# Patient Record
Sex: Female | Born: 1990 | Race: White | Hispanic: No | Smoking: Current every day smoker
Health system: Southern US, Community
[De-identification: ages and names within clinical notes are randomized; demographics above are authoritative.]

## PROBLEM LIST (undated history)

## (undated) ENCOUNTER — Inpatient Hospital Stay: Payer: Self-pay

## (undated) DIAGNOSIS — D649 Anemia, unspecified: Secondary | ICD-10-CM

## (undated) DIAGNOSIS — K649 Unspecified hemorrhoids: Secondary | ICD-10-CM

## (undated) DIAGNOSIS — I10 Essential (primary) hypertension: Secondary | ICD-10-CM

## (undated) DIAGNOSIS — K219 Gastro-esophageal reflux disease without esophagitis: Secondary | ICD-10-CM

## (undated) DIAGNOSIS — F32A Depression, unspecified: Secondary | ICD-10-CM

## (undated) DIAGNOSIS — Z5189 Encounter for other specified aftercare: Secondary | ICD-10-CM

## (undated) DIAGNOSIS — Z720 Tobacco use: Secondary | ICD-10-CM

## (undated) HISTORY — DX: Encounter for other specified aftercare: Z51.89

## (undated) HISTORY — DX: Gastro-esophageal reflux disease without esophagitis: K21.9

## (undated) HISTORY — DX: Tobacco use: Z72.0

## (undated) HISTORY — DX: Depression, unspecified: F32.A

## (undated) HISTORY — PX: NO PAST SURGERIES: SHX2092

## (undated) HISTORY — DX: Anemia, unspecified: D64.9

---
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
## 2012-12-28 ENCOUNTER — Emergency Department: Payer: Self-pay | Admitting: Emergency Medicine

## 2012-12-28 LAB — COMPREHENSIVE METABOLIC PANEL
Albumin: 3.9 g/dL (ref 3.4–5.0)
Alkaline Phosphatase: 70 U/L
Anion Gap: 5 — ABNORMAL LOW (ref 7–16)
BUN: 4 mg/dL — ABNORMAL LOW (ref 7–18)
Bilirubin,Total: 0.2 mg/dL (ref 0.2–1.0)
Calcium, Total: 8.8 mg/dL (ref 8.5–10.1)
Chloride: 107 mmol/L (ref 98–107)
EGFR (African American): >60
EGFR (Non-African Amer.): >60
Glucose: 85 mg/dL (ref 65–99)
Osmolality: 272 (ref 275–301)
Potassium: 2.8 mmol/L — ABNORMAL LOW (ref 3.5–5.1)
SGOT(AST): 26 U/L (ref 15–37)
SGPT (ALT): 30 U/L (ref 12–78)
Total Protein: 7.5 g/dL (ref 6.4–8.2)

## 2012-12-28 LAB — CBC WITH DIFFERENTIAL/PLATELET
Basophil #: 0 10*3/uL (ref 0.0–0.1)
Eosinophil %: 2 %
Lymphocyte %: 37.6 %
MCHC: 34.1 g/dL (ref 32.0–36.0)
Monocyte #: 0.7 x10 3/mm (ref 0.2–0.9)
Monocyte %: 8.4 %
Neutrophil %: 51.5 %

## 2012-12-29 LAB — URINALYSIS, COMPLETE
Bilirubin,UR: NEGATIVE
Glucose,UR: NEGATIVE mg/dL (ref 0–75)
Ketone: NEGATIVE
Ph: 5 (ref 4.5–8.0)
RBC,UR: 4 /HPF (ref 0–5)
Squamous Epithelial: 17

## 2012-12-29 LAB — WET PREP, GENITAL

## 2013-01-06 ENCOUNTER — Emergency Department: Payer: Self-pay | Admitting: Internal Medicine

## 2013-01-06 LAB — WET PREP, GENITAL

## 2013-01-06 LAB — CBC
HCT: 43.8 % (ref 35.0–47.0)
MCH: 28.9 pg (ref 26.0–34.0)
MCHC: 33.5 g/dL (ref 32.0–36.0)
MCV: 87 fL (ref 80–100)
Platelet: 239 10*3/uL (ref 150–440)
RDW: 13.6 % (ref 11.5–14.5)
WBC: 7.3 10*3/uL (ref 3.6–11.0)

## 2013-01-06 LAB — URINALYSIS, COMPLETE
Ketone: NEGATIVE
Leukocyte Esterase: NEGATIVE
Nitrite: NEGATIVE
RBC,UR: 1 /HPF (ref 0–5)
Specific Gravity: 1.005 (ref 1.003–1.030)
Squamous Epithelial: 4
WBC UR: 2 /HPF (ref 0–5)

## 2013-01-06 LAB — HCG, QUANTITATIVE, PREGNANCY: Beta Hcg, Quant.: 142 m[IU]/mL — ABNORMAL HIGH

## 2013-09-06 ENCOUNTER — Emergency Department: Payer: Self-pay | Admitting: Emergency Medicine

## 2013-09-06 LAB — CBC
HCT: 41.3 % (ref 35.0–47.0)
HGB: 14 g/dL (ref 12.0–16.0)
MCH: 30.1 pg (ref 26.0–34.0)
MCHC: 34 g/dL (ref 32.0–36.0)
MCV: 89 fL (ref 80–100)
Platelet: 224 10*3/uL (ref 150–440)
RBC: 4.66 10*6/uL (ref 3.80–5.20)
RDW: 13.1 % (ref 11.5–14.5)
WBC: 9.8 10*3/uL (ref 3.6–11.0)

## 2013-09-06 LAB — URINALYSIS, COMPLETE
BLOOD: NEGATIVE
Bilirubin,UR: NEGATIVE
Glucose,UR: NEGATIVE mg/dL (ref 0–75)
Ketone: NEGATIVE
Nitrite: NEGATIVE
PH: 5 (ref 4.5–8.0)
PROTEIN: NEGATIVE
RBC,UR: 4 /HPF (ref 0–5)
Specific Gravity: 1.019 (ref 1.003–1.030)
Squamous Epithelial: 27
WBC UR: 35 /HPF (ref 0–5)

## 2013-09-07 LAB — GC/CHLAMYDIA PROBE AMP

## 2013-09-07 LAB — WET PREP, GENITAL

## 2013-09-07 LAB — HCG, QUANTITATIVE, PREGNANCY: BETA HCG, QUANT.: 26649 m[IU]/mL — AB

## 2013-11-17 ENCOUNTER — Observation Stay: Payer: Self-pay

## 2013-11-17 LAB — URINALYSIS, COMPLETE
BILIRUBIN, UR: NEGATIVE
Bacteria: NONE SEEN
Blood: NEGATIVE
Glucose,UR: NEGATIVE mg/dL (ref 0–75)
Ketone: NEGATIVE
Leukocyte Esterase: NEGATIVE
NITRITE: NEGATIVE
Ph: 6 (ref 4.5–8.0)
Protein: NEGATIVE
RBC,UR: NONE SEEN /HPF (ref 0–5)
Specific Gravity: 1.001 (ref 1.003–1.030)
Squamous Epithelial: 2
WBC UR: NONE SEEN /HPF (ref 0–5)

## 2014-03-11 ENCOUNTER — Inpatient Hospital Stay: Payer: Self-pay | Admitting: Obstetrics and Gynecology

## 2014-03-11 LAB — CBC WITH DIFFERENTIAL/PLATELET
BASOS ABS: 0 10*3/uL (ref 0.0–0.1)
BASOS PCT: 0.3 %
Eosinophil #: 0 10*3/uL (ref 0.0–0.7)
Eosinophil %: 0.2 %
HCT: 38.4 % (ref 35.0–47.0)
HGB: 12.6 g/dL (ref 12.0–16.0)
Lymphocyte #: 1.8 10*3/uL (ref 1.0–3.6)
Lymphocyte %: 18.3 %
MCH: 27 pg (ref 26.0–34.0)
MCHC: 32.8 g/dL (ref 32.0–36.0)
MCV: 82 fL (ref 80–100)
MONOS PCT: 5.1 %
Monocyte #: 0.5 x10 3/mm (ref 0.2–0.9)
Neutrophil #: 7.5 10*3/uL — ABNORMAL HIGH (ref 1.4–6.5)
Neutrophil %: 76.1 %
PLATELETS: 263 10*3/uL (ref 150–440)
RBC: 4.66 10*6/uL (ref 3.80–5.20)
RDW: 15.5 % — ABNORMAL HIGH (ref 11.5–14.5)
WBC: 9.9 10*3/uL (ref 3.6–11.0)

## 2014-03-12 LAB — PLATELET COUNT: PLATELETS: 221 10*3/uL (ref 150–440)

## 2014-03-12 LAB — GC/CHLAMYDIA PROBE AMP

## 2014-03-13 LAB — HEMATOCRIT: HCT: 31.6 % — ABNORMAL LOW (ref 35.0–47.0)

## 2014-05-31 DIAGNOSIS — Z720 Tobacco use: Secondary | ICD-10-CM | POA: Insufficient documentation

## 2014-06-14 NOTE — H&P (Signed)
L&D Evaluation:  History:  HPI 23yo SWF sent over from office with SROM at 41 weeks, [id number]; pregancy complicated by late entry to care and smoking   Presents with leaking fluid   Patient's Medical History No Chronic Illness   Patient's Surgical History none   Medications Pre Natal Vitamins  Tylenol (Acetaminophen)   Allergies NKDA   Social History tobacco   Family History Non-Contributory   ROS:  ROS All systems were reviewed.  HEENT, CNS, GI, GU, Respiratory, CV, Renal and Musculoskeletal systems were found to be normal.   Exam:  Vital Signs stable   General no apparent distress   Mental Status clear   Chest clear   Heart normal sinus rhythm   Abdomen gravid, tender with contractions   Estimated Fetal Weight Average for gestational age   Fetal Position vtx   Edema no edema   Pelvic no external lesions, 1/70/-2   Mebranes Ruptured, 1345   Description clear   FHT normal rate with no decels   Fetal Heart Rate 135   Ucx irregular   Ucx Frequency 5 min   Length of each Contraction 50 seconds   Ucx Pain Scale 5   Skin dry   Impression:  Impression early labor, SROM at 41 weeks   Plan:  Plan EFM/NST, monitor contractions and for cervical change   Comments intermittent monitoring during early labor   Electronic Signatures: Yolanda BonineBurr, Melody N (CNM)  (Signed 05-Feb-16 21:33)  Authored: L&D Evaluation   Last Updated: 05-Feb-16 21:33 by Ulyses AmorBurr, Melody N (CNM)

## 2015-02-05 NOTE — L&D Delivery Note (Signed)
Delivery Note At 0233am a viable and healthy female "Jasmine Gray"  was delivered via  (Presentation:LOA ;  ).  APGAR:8 ,9 ; weight  2200gm/ 4#14oz  .   Placenta status: delivered intact with three vessel  Cord:  with the following complications: Bowdle x1 with cord around leg also, unable to reduce on perineum, infant delivered through; prolonged PROM  Anesthesia:  epidural Episiotomy:  none Lacerations:  none Suture Repair: NA Est. Blood Loss (mL):  200  Mom to postpartum.  Baby to Couplet care / Skin to Skin.  Melody NIKE Shambley, CNM 01/10/2016, 2:43 AM

## 2015-06-10 ENCOUNTER — Encounter: Payer: Self-pay | Admitting: *Deleted

## 2015-06-10 DIAGNOSIS — O26899 Other specified pregnancy related conditions, unspecified trimester: Secondary | ICD-10-CM | POA: Insufficient documentation

## 2015-06-10 DIAGNOSIS — F17210 Nicotine dependence, cigarettes, uncomplicated: Secondary | ICD-10-CM | POA: Insufficient documentation

## 2015-06-10 DIAGNOSIS — R1030 Lower abdominal pain, unspecified: Secondary | ICD-10-CM | POA: Diagnosis present

## 2015-06-10 LAB — URINALYSIS COMPLETE WITH MICROSCOPIC (ARMC ONLY)
Bilirubin Urine: NEGATIVE
Glucose, UA: NEGATIVE mg/dL
Hgb urine dipstick: NEGATIVE
KETONES UR: NEGATIVE mg/dL
Nitrite: NEGATIVE
PH: 6 (ref 5.0–8.0)
Protein, ur: NEGATIVE mg/dL
SPECIFIC GRAVITY, URINE: 1.003 — AB (ref 1.005–1.030)

## 2015-06-10 LAB — POCT PREGNANCY, URINE: Preg Test, Ur: POSITIVE — AB

## 2015-06-10 NOTE — ED Notes (Signed)
Pt c/o R plevic pain radiating to back. Pt states faint positive home preg test x 2 days ago. Pt states nausea, denies vomiting. Pt denies blood in urine and vaginal discharge and bleeding. Pt c/o slight dysuria, relates to prior hx of UTI while pregnant.

## 2015-06-11 ENCOUNTER — Emergency Department
Admission: EM | Admit: 2015-06-11 | Discharge: 2015-06-11 | Disposition: A | Discharge status: Home / Self Care | Payer: Medicaid Other | Attending: Emergency Medicine | Admitting: Emergency Medicine

## 2015-06-11 DIAGNOSIS — Z3490 Encounter for supervision of normal pregnancy, unspecified, unspecified trimester: Secondary | ICD-10-CM

## 2015-06-11 DIAGNOSIS — R109 Unspecified abdominal pain: Secondary | ICD-10-CM

## 2015-06-11 LAB — CBC WITH DIFFERENTIAL/PLATELET
Basophils Absolute: 0 10*3/uL (ref 0–0.1)
Basophils Relative: 0 %
Eosinophils Absolute: 0.1 10*3/uL (ref 0–0.7)
Eosinophils Relative: 1 %
HEMATOCRIT: 42.5 % (ref 35.0–47.0)
HEMOGLOBIN: 14.6 g/dL (ref 12.0–16.0)
LYMPHS ABS: 2.6 10*3/uL (ref 1.0–3.6)
Lymphocytes Relative: 33 %
MCH: 29.4 pg (ref 26.0–34.0)
MCHC: 34.5 g/dL (ref 32.0–36.0)
MCV: 85.4 fL (ref 80.0–100.0)
MONOS PCT: 6 %
Monocytes Absolute: 0.5 10*3/uL (ref 0.2–0.9)
NEUTROS ABS: 4.7 10*3/uL (ref 1.4–6.5)
NEUTROS PCT: 60 %
Platelets: 226 10*3/uL (ref 150–440)
RBC: 4.97 MIL/uL (ref 3.80–5.20)
RDW: 14.3 % (ref 11.5–14.5)
WBC: 7.8 10*3/uL (ref 3.6–11.0)

## 2015-06-11 LAB — HCG, QUANTITATIVE, PREGNANCY: hCG, Beta Chain, Quant, S: 2695 m[IU]/mL — ABNORMAL HIGH (ref <5)

## 2015-06-11 NOTE — ED Provider Notes (Signed)
[facility] Emergency Department Provider Note    ____________________________________________  Time seen: ~0110  I have reviewed the triage vital signs and the nursing notes.   HISTORY  Chief Complaint Pelvic Pain   History limited by: Not Limited   HPI Jasmine Gray is a 25 y.o. female who presents to the emergency department today with concerns for abdominal pain. She states that the pain started roughly 17 hours ago. She noticed it after waking. She describes it as being located in the lower abdomen and bilateral lower abdomen. She describes it as a dull type pain. She states that she is pregnant although unsure how far along. She states that her last menstrual period was in the mid to late March. She states that she has had similar pain with her 2 previous pregnancies. Both times she states that they're secondary to urinary tract infection. She does state that she has had some hesitancy with urination although no pain or foul odor. Patient has not had any abnormal vaginal discharge or bleeding. No fevers.    History reviewed. No pertinent past medical history.  Patient Active Problem List   Diagnosis Date Noted  . Tobacco user 05/31/2014    History reviewed. No pertinent past surgical history.  Current Outpatient Rx  Name  Route  Sig  Dispense  Refill  . medroxyPROGESTERone (DEPO-PROVERA) 150 MG/ML injection   Intramuscular   Inject 150 mg into the muscle every 3 (three) months.           Allergies Penicillins  History reviewed. No pertinent family history.  Social History Social History  Substance Use Topics  . Smoking status: Current Every Day Smoker    Types: Cigarettes  . Smokeless tobacco: Never Used  . Alcohol Use: No    Review of Systems  Constitutional: Negative for fever. Cardiovascular: Negative for chest pain. Respiratory: Negative for shortness of breath. Gastrointestinal:Positive for lower abdominal  pain Neurological: Negative for headaches, focal weakness or numbness.   10-point ROS otherwise negative.  ____________________________________________   PHYSICAL EXAM:  VITAL SIGNS: ED Triage Vitals  Enc Vitals Group     BP 06/10/15 2233 144/76 mmHg     Pulse Rate 06/10/15 2233 118     Resp 06/10/15 2233 18     Temp 06/10/15 2233 98.6 F (37 C)     Temp Source 06/10/15 2233 Oral     SpO2 06/10/15 2233 100 %     Weight 06/10/15 2233 205 lb 1.6 oz (93.033 kg)     Height 06/10/15 2233  (1.727 m)     Head Cir --      Peak Flow --      Pain Score 06/10/15 2235 5   Constitutional: Alert and oriented. Well appearing and in no distress. Eyes: Conjunctivae are normal. PERRL. Normal extraocular movements. ENT   Head: Normocephalic and atraumatic.   Nose: No congestion/rhinnorhea.   Mouth/Throat: Mucous membranes are moist.   Neck: No stridor. Hematological/Lymphatic/Immunilogical: No cervical lymphadenopathy. Cardiovascular: Normal rate, regular rhythm.  No murmurs, rubs, or gallops. Respiratory: Normal respiratory effort without tachypnea nor retractions. Breath sounds are clear and equal bilaterally. No wheezes/rales/rhonchi. Gastrointestinal: Soft and nontender. No distention. There is no CVA tenderness. Genitourinary: Deferred Musculoskeletal: Normal range of motion in all extremities. No joint effusions.  No lower extremity tenderness nor edema. Neurologic:  Normal speech and language. No gross focal neurologic deficits are appreciated.  Skin:  Skin is warm, dry and intact. No rash noted. Psychiatric: Mood  and affect are normal. Speech and behavior are normal. Patient exhibits appropriate insight and judgment.  ____________________________________________    LABS (pertinent positives/negatives)  Labs Reviewed  HCG, QUANTITATIVE, PREGNANCY - Abnormal; Notable for the following:    hCG, Beta Chain, Quant, S 2695 (*)    All other components within normal  limits  URINALYSIS COMPLETEWITH MICROSCOPIC ([facility] ONLY) - Abnormal; Notable for the following:    Color, Urine STRAW (*)    APPearance CLEAR (*)    Specific Gravity, Urine 1.003 (*)    Leukocytes, UA 1+ (*)    Bacteria, UA RARE (*)    Squamous Epithelial / LPF 0-5 (*)    All other components within normal limits  POCT PREGNANCY, URINE - Abnormal; Notable for the following:    Preg Test, Ur POSITIVE (*)    All other components within normal limits  URINE CULTURE  CBC WITH DIFFERENTIAL/PLATELET     ____________________________________________   EKG  None  ____________________________________________    RADIOLOGY  None  ____________________________________________   PROCEDURES  Procedure(s) performed: None  Critical Care performed: No  ____________________________________________   INITIAL IMPRESSION / ASSESSMENT AND PLAN / ED COURSE  Pertinent labs & imaging results that were available during my care of the patient were reviewed by me and considered in my medical decision making (see chart for details).  Patient presented to the emergency department today because of concerns for bilateral lower abdominal pain in the setting of early pregnancy. She states this feels similar to the previous time she has a UTI. UA shows 1+ leukocytes and rare bacteria however no other concerning findings. Will send for a urine culture. Patient's beta-hCG less than 3000. Patient without any audible pain during her stay here in the emergency department. At this point I highly doubt ectopic. The patient would have an unusual presentation given the pain has completely resolved, abdomen was nontender and the pain was bilateral. Patient does state that she has an OB/GYN doctor and can schedule an appointment shortly. Encourage patient to start prenatal vitamins.  ____________________________________________   FINAL CLINICAL IMPRESSION(S) / ED DIAGNOSES  Final diagnoses:  Pregnancy   Abdominal pain, unspecified abdominal location     Phineas SemenGraydon Vahan Wadsworth, MD 06/11/15 [phone]

## 2015-06-11 NOTE — Discharge Instructions (Signed)
As we discussed please start taking prenatal vitamins. Please seek medical attention for any high fevers, chest pain, shortness of breath, change in behavior, persistent vomiting, bloody stool or any other new or concerning symptoms.  First Trimester of Pregnancy The first trimester of pregnancy is from week 1 until the end of week 12 (months 1 through 3). During this time, your baby will begin to develop inside you. At 6-8 weeks, the eyes and face are formed, and the heartbeat can be seen on ultrasound. At the end of 12 weeks, all the baby's organs are formed. Prenatal care is all the medical care you receive before the birth of your baby. Make sure you get good prenatal care and follow all of your doctor's instructions. HOME CARE  Medicines  Take medicine only as told by your doctor. Some medicines are safe and some are not during pregnancy.  Take your prenatal vitamins as told by your doctor.  Take medicine that helps you poop (stool softener) as needed if your doctor says it is okay. Diet  Eat regular, healthy meals.  Your doctor will tell you the amount of weight gain that is right for you.  Avoid raw meat and uncooked cheese.  If you feel sick to your stomach (nauseous) or throw up (vomit):  Eat 4 or 5 small meals a day instead of 3 large meals.  Try eating a few soda crackers.  Drink liquids between meals instead of during meals.  If you have a hard time pooping (constipation):  Eat high-fiber foods like fresh vegetables, fruit, and whole grains.  Drink enough fluids to keep your pee (urine) clear or pale yellow. Activity and Exercise  Exercise only as told by your doctor. Stop exercising if you have cramps or pain in your lower belly (abdomen) or low back.  Try to avoid standing for long periods of time. Move your legs often if you must stand in one place for a long time.  Avoid heavy lifting.  Wear low-heeled shoes. Sit and stand up straight.  You can have sex  unless your doctor tells you not to. Relief of Pain or Discomfort  Wear a good support bra if your breasts are sore.  Take warm water baths (sitz baths) to soothe pain or discomfort caused by hemorrhoids. Use hemorrhoid cream if your doctor says it is okay.  Rest with your legs raised if you have leg cramps or low back pain.  Wear support hose if you have puffy, bulging veins (varicose veins) in your legs. Raise (elevate) your feet for 15 minutes, 3-4 times a day. Limit salt in your diet. Prenatal Care  Schedule your prenatal visits by the twelfth week of pregnancy.  Write down your questions. Take them to your prenatal visits.  Keep all your prenatal visits as told by your doctor. Safety  Wear your seat belt at all times when driving.  Make a list of emergency phone numbers. The list should include numbers for family, friends, the hospital, and police and fire departments. General Tips  Ask your doctor for a referral to a local prenatal class. Begin classes no later than at the start of month 6 of your pregnancy.  Ask for help if you need counseling or help with nutrition. Your doctor can give you advice or tell you where to go for help.  Do not use hot tubs, steam rooms, or saunas.  Do not douche or use tampons or scented sanitary pads.  Do not cross your legs for  long periods of time.  Avoid litter boxes and soil used by cats.  Avoid all smoking, herbs, and alcohol. Avoid drugs not approved by your doctor.  Do not use any tobacco products, including cigarettes, chewing tobacco, and electronic cigarettes. If you need help quitting, ask your doctor. You may get counseling or other support to help you quit.  Visit your dentist. At home, brush your teeth with a soft toothbrush. Be gentle when you floss. GET HELP IF:  You are dizzy.  You have mild cramps or pressure in your lower belly.  You have a nagging pain in your belly area.  You continue to feel sick to your  stomach, throw up, or have watery poop (diarrhea).  You have a bad smelling fluid coming from your vagina.  You have pain with peeing (urination).  You have increased puffiness (swelling) in your face, hands, legs, or ankles. GET HELP RIGHT AWAY IF:   You have a fever.  You are leaking fluid from your vagina.  You have spotting or bleeding from your vagina.  You have very bad belly cramping or pain.  You gain or lose weight rapidly.  You throw up blood. It may look like coffee grounds.  You are around people who have [place] measles, fifth disease, or chickenpox.  You have a very bad headache.  You have shortness of breath.  You have any kind of trauma, such as from a fall or a car accident.   This information is not intended to replace advice given to you by your health care provider. Make sure you discuss any questions you have with your health care provider.   Document Released: 07/10/2007 Document Revised: 02/11/2014 Document Reviewed: 12/01/2012 Else[employer] Interactive Patient Education [employer]2016 [employer]

## 2015-06-12 LAB — URINE CULTURE

## 2015-06-26 ENCOUNTER — Ambulatory Visit (INDEPENDENT_AMBULATORY_CARE_PROVIDER_SITE_OTHER): Payer: Medicaid Other | Admitting: Obstetrics and Gynecology

## 2015-06-26 VITALS — BP 121/73 | HR 99 | Wt 202.6 lb

## 2015-06-26 DIAGNOSIS — Z720 Tobacco use: Secondary | ICD-10-CM

## 2015-06-26 DIAGNOSIS — Z3687 Encounter for antenatal screening for uncertain dates: Secondary | ICD-10-CM

## 2015-06-26 DIAGNOSIS — Z3491 Encounter for supervision of normal pregnancy, unspecified, first trimester: Secondary | ICD-10-CM

## 2015-06-26 DIAGNOSIS — Z36 Encounter for antenatal screening of mother: Secondary | ICD-10-CM

## 2015-06-26 MED ORDER — CITRANATAL B-CALM 20-1 MG & 2 X 25 MG PO MISC: 20.0000 mg | Freq: Every day | ORAL | Status: DC

## 2015-06-26 NOTE — Patient Instructions (Signed)
Morning Sickness Morning sickness is when you feel sick to your stomach (nauseous) during pregnancy. You may feel sick to your stomach and throw up (vomit). You may feel sick in the morning, but you can feel this way any time of day. Some women feel very sick to their stomach and cannot stop throwing up (hyperemesis gravidarum). HOME CARE  Only take medicines as told by your doctor.  Take multivitamins as told by your doctor. Taking multivitamins before getting pregnant can stop or lessen the harshness of morning sickness.  Eat dry toast or unsalted crackers before getting out of bed.  Eat 5 to 6 small meals a day.  Eat dry and bland foods like rice and baked potatoes.  Do not drink liquids with meals. Drink between meals.  Do not eat greasy, fatty, or spicy foods.  Have someone cook for you if the smell of food causes you to feel sick or throw up.  If you feel sick to your stomach after taking prenatal vitamins, take them at night or with a snack.  Eat protein when you need a snack (nuts, yogurt, cheese).  Eat unsweetened gelatins for dessert.  Wear a bracelet used for sea sickness (acupressure wristband).  Go to a doctor that puts thin needles into certain body points (acupuncture) to improve how you feel.  Do not smoke.  Use a humidifier to keep the air in your house free of odors.  Get lots of fresh air. GET HELP IF:  You need medicine to feel better.  You feel dizzy or lightheaded.  You are losing weight. GET HELP RIGHT AWAY IF:   You feel very sick to your stomach and cannot stop throwing up.  You pass out (faint). MAKE SURE YOU:  Understand these instructions.  Will watch your condition.  Will get help right away if you are not doing well or get worse.   This information is not intended to replace advice given to you by your health care provider. Make sure you discuss any questions you have with your health care provider.   Document Released: 02/29/2004  Document Revised: 02/11/2014 Document Reviewed: 07/08/2012 Elsevier Interactive Patient Education 2016 [employer]. Pregnancy and Zika Virus Disease Zika virus disease, or Zika, is an illness that can spread to people from mosquitoes that carry the virus. It may also spread from person to person through infected body fluids. Zika first occurred in [place], but recently it has spread to new areas. The virus occurs in tropical climates. The location of Zika continues to change. Most people who become infected with Zika virus do not develop serious illness. However, Zika may cause birth defects in an unborn baby whose mother is infected with the virus. It may also increase the risk of miscarriage. WHAT ARE THE SYMPTOMS OF ZIKA VIRUS DISEASE? In many cases, people who have been infected with Zika virus do not develop any symptoms. If symptoms appear, they usually start about a week after the person is infected. Symptoms are usually mild. They may include:  Fever.  Rash.  Red eyes.  Joint pain. HOW DOES ZIKA VIRUS DISEASE SPREAD? The main way that Zika virus spreads is through the bite of a certain type of mosquito. Unlike most types of mosquitos, which bite only at night, the type of mosquito that carries Zika virus bites both at night and during the day. Zika virus can also spread through sexual contact, through a blood transfusion, and from a mother to her baby before or during birth.  Once you have had Zika virus disease, it is unlikely that you will get it again. CAN I PASS ZIKA TO MY BABY DURING PREGNANCY? Yes, Zika can pass from a mother to her baby before or during birth. WHAT PROBLEMS CAN ZIKA CAUSE FOR MY BABY? A woman who is infected with Zika virus while pregnant is at risk of having her baby born with a condition in which the brain or head is smaller than expected (microcephaly). Babies who have microcephaly can have developmental delays, seizures, hearing problems, and vision  problems. Having Zika virus disease during pregnancy can also increase the risk of miscarriage. HOW CAN ZIKA VIRUS DISEASE BE PREVENTED? There is no vaccine to prevent Zika. The best way to prevent the disease is to avoid infected mosquitoes and avoid exposure to body fluids that can spread the virus. Avoid any possible exposure to Zika by taking the following precautions. For women and their sex partners:  Avoid traveling to high-risk areas. The locations where [place] is being reported change often. To identify high-risk areas, check the CDC travel website: http://davidson-gomez.com/  If you or your sex partner must travel to a high-risk area, talk with a health care provider before and after traveling.  Take all precautions to avoid mosquito bites if you live in, or travel to, any of the high-risk areas. Insect repellents are safe to use during pregnancy.  Ask your health care provider when it is safe to have sexual contact. For women:  If you are pregnant or trying to become pregnant, avoid sexual contact with persons who may have been exposed to [place] virus, persons who have possible symptoms of Zika, or persons whose history you are unsure about. If you choose to have sexual contact with someone who may have been exposed to [place] virus, use condoms correctly during the entire duration of sexual activity, every time. Do not share sexual devices, as you may be exposed to body fluids.  Ask your health care provider about when it is safe to attempt pregnancy after a possible exposure to Zika virus. WHAT STEPS SHOULD I TAKE TO AVOID MOSQUITO BITES? Take these steps to avoid mosquito bites when you are in a high-risk area:  Wear loose clothing that covers your arms and legs.  Limit your outdoor activities.  Do not open windows unless they have window screens.  Sleep under mosquito nets.  Use insect repellent. The best insect repellents have:  DEET, picaridin, oil of lemon eucalyptus  (OLE), or IR3535 in them.  Higher amounts of an active ingredient in them.  Remember that insect repellents are safe to use during pregnancy.  Do not use OLE on children who are younger than 57 years of age. Do not use insect repellent on babies who are younger than 91 months of age.  Cover your child's stroller with mosquito netting. Make sure the netting fits snugly and that any loose netting does not cover your child's mouth or nose. Do not use a blanket as a mosquito-protection cover.  Do not apply insect repellent underneath clothing.  If you are using sunscreen, apply the sunscreen before applying the insect repellent.  Treat clothing with permethrin. Do not apply permethrin directly to your skin. Follow label directions for safe use.  Get rid of standing water, where mosquitoes may reproduce. Standing water is often found in items such as buckets, bowls, animal food dishes, and flowerpots. When you return from traveling to any high-risk area, continue taking actions to protect yourself against mosquito bites  for 3 weeks, even if you show no signs of illness. This will prevent spreading Zika virus to uninfected mosquitoes. WHAT SHOULD I KNOW ABOUT THE SEXUAL TRANSMISSION OF ZIKA? People can spread Zika to their sexual partners during vaginal, anal, or oral sex, or by sharing sexual devices. Many people with [place] do not develop symptoms, so a person could spread the disease without knowing that they are infected. The greatest risk is to women who are pregnant or who may become pregnant. Zika virus can live longer in semen than it can live in blood. Couples can prevent sexual transmission of the virus by:  Using condoms correctly during the entire duration of sexual activity, every time. This includes vaginal, anal, and oral sex.  Not sharing sexual devices. Sharing increases your risk of being exposed to body fluid from another person.  Avoiding all sexual activity until your health care  provider says it is safe. SHOULD I BE TESTED FOR ZIKA VIRUS? A sample of your blood can be tested for Zika virus. A pregnant woman should be tested if she may have been exposed to the virus or if she has symptoms of Zika. She may also have additional tests done during her pregnancy, such ultrasound testing. Talk with your health care provider about which tests are recommended.   This information is not intended to replace advice given to you by your health care provider. Make sure you discuss any questions you have with your health care provider.   Document Released: 10/12/2014 Document Reviewed: 10/05/2014 Elsevier Interactive Patient Education [employer] How a Baby Grows During Pregnancy Pregnancy begins when a female's sperm enters a female's egg (fertilization). This happens in one of the tubes (fallopian tubes) that connect the ovaries to the womb (uterus). The fertilized egg is called an embryo until it reaches 10 weeks. From 10 weeks until birth, it is called a fetus. The fertilized egg moves down the fallopian tube to the uterus. Then it implants into the lining of the uterus and begins to grow. The developing fetus receives oxygen and nutrients through the pregnant woman's bloodstream and the tissues that grow (placenta) to support the fetus. The placenta is the life support system for the fetus. It provides nutrition and removes waste. Learning as much as you can about your pregnancy and how your baby is developing can help you enjoy the experience. It can also make you aware of when there might be a problem and when to ask questions. HOW LONG DOES A TYPICAL PREGNANCY LAST? A pregnancy usually lasts 280 days, or about 40 weeks. Pregnancy is divided into three trimesters:  First trimester: 0-13 weeks.  Second trimester: 14-27 weeks.  Third trimester: 28-40 weeks. The day when your baby is considered ready to be born (full term) is your estimated date of delivery. HOW DOES MY BABY  DEVELOP MONTH BY MONTH? First month  The fertilized egg attaches to the inside of the uterus.  Some cells will form the placenta. Others will form the fetus.  The arms, legs, brain, spinal cord, lungs, and heart begin to develop.  At the end of the first month, the heart begins to beat. Second month  The bones, inner ear, eyelids, hands, and feet form.  The genitals develop.  By the end of 8 weeks, all major organs are developing. Third month  All of the internal organs are forming.  Teeth develop below the gums.  Bones and muscles begin to grow. The spine can flex.  The  skin is transparent.  Fingernails and toenails begin to form.  Arms and legs continue to grow longer, and hands and feet develop.  The fetus is about 3 in (7.6 cm) long. Fourth month  The placenta is completely formed.  The external sex organs, neck, outer ear, eyebrows, eyelids, and fingernails are formed.  The fetus can hear, swallow, and move its arms and legs.  The kidneys begin to produce urine.  The skin is covered with a white waxy coating (vernix) and very fine hair (lanugo). Fifth month  The fetus moves around more and can be felt for the first time (quickening).  The fetus starts to sleep and wake up and may begin to suck its finger.  The nails grow to the end of the fingers.  The organ in the digestive system that makes bile (gallbladder) functions and helps to digest the nutrients.  If your baby is a girl, eggs are present in her ovaries. If your baby is a boy, testicles start to move down into his scrotum. Sixth month  The lungs are formed, but the fetus is not yet able to breathe.  The eyes open. The brain continues to develop.  Your baby has fingerprints and toe prints. Your baby's hair grows thicker.  At the end of the second trimester, the fetus is about 9 in (22.9 cm) long. Seventh month  The fetus kicks and stretches.  The eyes are developed enough to sense changes  in light.  The hands can make a grasping motion.  The fetus responds to sound. Eighth month  All organs and body systems are fully developed and functioning.  Bones harden and taste buds develop. The fetus may hiccup.  Certain areas of the brain are still developing. The skull remains soft. Ninth month  The fetus gains about  lb (0.23 kg) each week.  The lungs are fully developed.  Patterns of sleep develop.  The fetus's head typically moves into a head-down position (vertex) in the uterus to prepare for birth. If the buttocks move into a vertex position instead, the baby is breech.  The fetus weighs 6-9 lbs (2.72-4.08 kg) and is 19-20 in (48.26-50.8 cm) long. WHAT CAN I DO TO HAVE A HEALTHY PREGNANCY AND HELP MY BABY DEVELOP? Eating and Drinking  Eat a healthy diet.  Talk with your health care provider to make sure that you are getting the nutrients that you and your baby need.  Visit www.DisposableNylon.bechoosemyplate.gov to learn about creating a healthy diet.  Gain a healthy amount of weight during pregnancy as advised by your health care provider. This is usually 25-35 pounds. You may need to:  Gain more if you were underweight before getting pregnant or if you are pregnant with more than one baby.  Gain less if you were overweight or obese when you got pregnant. Medicines and Vitamins  Take prenatal vitamins as directed by your health care provider. These include vitamins such as folic acid, iron, calcium, and vitamin D. They are important for healthy development.  Take medicines only as directed by your health care provider. Read labels and ask a pharmacist or your health care provider whether over-the-counter medicines, supplements, and prescription drugs are safe to take during pregnancy. Activities  Be physically active as advised by your health care provider. Ask your health care provider to recommend activities that are safe for you to do, such as walking or swimming.  Do  not participate in strenuous or extreme sports. Lifestyle  Do not drink  alcohol.  Do not use any tobacco products, including cigarettes, chewing tobacco, or electronic cigarettes. If you need help quitting, ask your health care provider.  Do not use illegal drugs. Safety  Avoid exposure to mercury, lead, or other heavy metals. Ask your health care provider about common sources of these heavy metals.  Avoid listeria infection during pregnancy. Follow these precautions:  Do not eat soft cheeses or deli meats.  Do not eat hot dogs unless they have been warmed up to the point of steaming, such as in the microwave oven.  Do not drink unpasteurized milk.  Avoid toxoplasmosis infection during pregnancy. Follow these precautions:  Do not change your cat's litter box, if you have a cat. Ask someone else to do this for you.  Wear gardening gloves while working in the yard. General Instructions  Keep all follow-up visits as directed by your health care provider. This is important. This includes prenatal care and screening tests.  Manage any chronic health conditions. Work closely with your health care provider to keep conditions, such as diabetes, under control. HOW DO I KNOW IF MY BABY IS DEVELOPING WELL? At each prenatal visit, your health care provider will do several different tests to check on your health and keep track of your baby's development. These include:  Fundal height.  Your health care provider will measure your growing belly from top to bottom using a tape measure.  Your health care provider will also feel your belly to determine your baby's position.  Heartbeat.  An ultrasound in the first trimester can confirm pregnancy and show a heartbeat, depending on how far along you are.  Your health care provider will check your baby's heart rate at every prenatal visit.  As you get closer to your delivery date, you may have regular fetal heart rate monitoring to make sure  that your baby is not in distress.  Second trimester ultrasound.  This ultrasound checks your baby's development. It also indicates your baby's gender. WHAT SHOULD I DO IF I HAVE CONCERNS ABOUT MY BABY'S DEVELOPMENT? Always talk with your health care provider about any concerns that you may have.   This information is not intended to replace advice given to you by your health care provider. Make sure you discuss any questions you have with your health care provider.   Document Released: 07/10/2007 Document Revised: 10/12/2014 Document Reviewed: 06/30/2013 Elsevier Interactive Patient Education 2016 [employer]. First Trimester of Pregnancy The first trimester of pregnancy is from week 1 until the end of week 12 (months 1 through 3). A week after a sperm fertilizes an egg, the egg will implant on the wall of the uterus. This embryo will begin to develop into a baby. Genes from you and your partner are forming the baby. The female genes determine whether the baby is a boy or a girl. At 6-8 weeks, the eyes and face are formed, and the heartbeat can be seen on ultrasound. At the end of 12 weeks, all the baby's organs are formed.  Now that you are pregnant, you will want to do everything you can to have a healthy baby. Two of the most important things are to get good prenatal care and to follow your health care provider's instructions. Prenatal care is all the medical care you receive before the baby's birth. This care will help prevent, find, and treat any problems during the pregnancy and childbirth. BODY CHANGES Your body goes through many changes during pregnancy. The changes vary from woman  to woman.   You may gain or lose a couple of pounds at first.  You may feel sick to your stomach (nauseous) and throw up (vomit). If the vomiting is uncontrollable, call your health care provider.  You may tire easily.  You may develop headaches that can be relieved by medicines approved by your health  care provider.  You may urinate more often. Painful urination may mean you have a bladder infection.  You may develop heartburn as a result of your pregnancy.  You may develop constipation because certain hormones are causing the muscles that push waste through your intestines to slow down.  You may develop hemorrhoids or swollen, bulging veins (varicose veins).  Your breasts may begin to grow larger and become tender. Your nipples may stick out more, and the tissue that surrounds them (areola) may become darker.  Your gums may bleed and may be sensitive to brushing and flossing.  Dark spots or blotches (chloasma, mask of pregnancy) may develop on your face. This will likely fade after the baby is born.  Your menstrual periods will stop.  You may have a loss of appetite.  You may develop cravings for certain kinds of food.  You may have changes in your emotions from day to day, such as being excited to be pregnant or being concerned that something may go wrong with the pregnancy and baby.  You may have more vivid and strange dreams.  You may have changes in your hair. These can include thickening of your hair, rapid growth, and changes in texture. Some women also have hair loss during or after pregnancy, or hair that feels dry or thin. Your hair will most likely return to normal after your baby is born. WHAT TO EXPECT AT YOUR PRENATAL VISITS During a routine prenatal visit:  You will be weighed to make sure you and the baby are growing normally.  Your blood pressure will be taken.  Your abdomen will be measured to track your baby's growth.  The fetal heartbeat will be listened to starting around week 10 or 12 of your pregnancy.  Test results from any previous visits will be discussed. Your health care provider may ask you:  How you are feeling.  If you are feeling the baby move.  If you have had any abnormal symptoms, such as leaking fluid, bleeding, severe headaches, or  abdominal cramping.  If you are using any tobacco products, including cigarettes, chewing tobacco, and electronic cigarettes.  If you have any questions. Other tests that may be performed during your first trimester include:  Blood tests to find your blood type and to check for the presence of any previous infections. They will also be used to check for low iron levels (anemia) and Rh antibodies. Later in the pregnancy, blood tests for diabetes will be done along with other tests if problems develop.  Urine tests to check for infections, diabetes, or protein in the urine.  An ultrasound to confirm the proper growth and development of the baby.  An amniocentesis to check for possible genetic problems.  Fetal screens for spina bifida and Down syndrome.  You may need other tests to make sure you and the baby are doing well.  HIV (human immunodeficiency virus) testing. Routine prenatal testing includes screening for HIV, unless you choose not to have this test. HOME CARE INSTRUCTIONS  Medicines  Follow your health care provider's instructions regarding medicine use. Specific medicines may be either safe or unsafe to take during pregnancy.  Take your prenatal vitamins as directed.  If you develop constipation, try taking a stool softener if your health care provider approves. Diet  Eat regular, well-balanced meals. Choose a variety of foods, such as meat or vegetable-based protein, fish, milk and low-fat dairy products, vegetables, fruits, and whole grain breads and cereals. Your health care provider will help you determine the amount of weight gain that is right for you.  Avoid raw meat and uncooked cheese. These carry germs that can cause birth defects in the baby.  Eating four or five small meals rather than three large meals a day may help relieve nausea and vomiting. If you start to feel nauseous, eating a few soda crackers can be helpful. Drinking liquids between meals instead of  during meals also seems to help nausea and vomiting.  If you develop constipation, eat more high-fiber foods, such as fresh vegetables or fruit and whole grains. Drink enough fluids to keep your urine clear or pale yellow. Activity and Exercise  Exercise only as directed by your health care provider. Exercising will help you:  Control your weight.  Stay in shape.  Be prepared for labor and delivery.  Experiencing pain or cramping in the lower abdomen or low back is a good sign that you should stop exercising. Check with your health care provider before continuing normal exercises.  Try to avoid standing for long periods of time. Move your legs often if you must stand in one place for a long time.  Avoid heavy lifting.  Wear low-heeled shoes, and practice good posture.  You may continue to have sex unless your health care provider directs you otherwise. Relief of Pain or Discomfort  Wear a good support bra for breast tenderness.   Take warm sitz baths to soothe any pain or discomfort caused by hemorrhoids. Use hemorrhoid cream if your health care provider approves.   Rest with your legs elevated if you have leg cramps or low back pain.  If you develop varicose veins in your legs, wear support hose. Elevate your feet for 15 minutes, 3-4 times a day. Limit salt in your diet. Prenatal Care  Schedule your prenatal visits by the twelfth week of pregnancy. They are usually scheduled monthly at first, then more often in the last 2 months before delivery.  Write down your questions. Take them to your prenatal visits.  Keep all your prenatal visits as directed by your health care provider. Safety  Wear your seat belt at all times when driving.  Make a list of emergency phone numbers, including numbers for family, friends, the hospital, and police and fire departments. General Tips  Ask your health care provider for a referral to a local prenatal education class. Begin classes no  later than at the beginning of month 6 of your pregnancy.  Ask for help if you have counseling or nutritional needs during pregnancy. Your health care provider can offer advice or refer you to specialists for help with various needs.  Do not use hot tubs, steam rooms, or saunas.  Do not douche or use tampons or scented sanitary pads.  Do not cross your legs for long periods of time.  Avoid cat litter boxes and soil used by cats. These carry germs that can cause birth defects in the baby and possibly loss of the fetus by miscarriage or stillbirth.  Avoid all smoking, herbs, alcohol, and medicines not prescribed by your health care provider. Chemicals in these affect the formation and growth of the baby.  Do not use any tobacco products, including cigarettes, chewing tobacco, and electronic cigarettes. If you need help quitting, ask your health care provider. You may receive counseling support and other resources to help you quit.  Schedule a dentist appointment. At home, brush your teeth with a soft toothbrush and be gentle when you floss. SEEK MEDICAL CARE IF:   You have dizziness.  You have mild pelvic cramps, pelvic pressure, or nagging pain in the abdominal area.  You have persistent nausea, vomiting, or diarrhea.  You have a bad smelling vaginal discharge.  You have pain with urination.  You notice increased swelling in your face, hands, legs, or ankles. SEEK IMMEDIATE MEDICAL CARE IF:   You have a fever.  You are leaking fluid from your vagina.  You have spotting or bleeding from your vagina.  You have severe abdominal cramping or pain.  You have rapid weight gain or loss.  You vomit blood or material that looks like coffee grounds.  You are exposed to [place] measles and have never had them.  You are exposed to fifth disease or chickenpox.  You develop a severe headache.  You have shortness of breath.  You have any kind of trauma, such as from a fall or a car  accident.   This information is not intended to replace advice given to you by your health care provider. Make sure you discuss any questions you have with your health care provider.   Document Released: 01/15/2001 Document Revised: 02/11/2014 Document Reviewed: 12/01/2012 Elsevier Interactive Patient Education 2016 [employer]. Commonly Asked Questions During Pregnancy  Cats: A parasite can be excreted in cat feces.  To avoid exposure you need to have another person empty the little box.  If you must empty the litter box you will need to wear gloves.  Wash your hands after handling your cat.  This parasite can also be found in raw or undercooked meat so this should also be avoided.  Colds, Sore Throats, Flu: Please check your medication sheet to see what you can take for symptoms.  If your symptoms are unrelieved by these medications please call the office.  Dental Work: Most any dental work Agricultural consultant recommends is permitted.  X-rays should only be taken during the first trimester if absolutely necessary.  Your abdomen should be shielded with a lead apron during all x-rays.  Please notify your provider prior to receiving any x-rays.  Novocaine is fine; gas is not recommended.  If your dentist requires a note from [place] prior to dental work please call the office and we will provide one for you.  Exercise: Exercise is an important part of staying healthy during your pregnancy.  You may continue most exercises you were accustomed to prior to pregnancy.  Later in your pregnancy you will most likely notice you have difficulty with activities requiring balance like riding a bicycle.  It is important that you listen to your body and avoid activities that put you at a higher risk of falling.  Adequate rest and staying well hydrated are a must!  If you have questions about the safety of specific activities ask your provider.    Exposure to Children with illness: Try to avoid obvious exposure; report any  symptoms to [place] when noted,  If you have chicken pos, red measles or mumps, you should be immune to these diseases.   Please do not take any vaccines while pregnant unless you have checked with your OB provider.  Fetal Movement: After  28 weeks we recommend you do "kick counts" twice daily.  Lie or sit down in a calm quiet environment and count your baby movements "kicks".  You should feel your baby at least 10 times per hour.  If you have not felt 10 kicks within the first hour get up, walk around and have something sweet to eat or drink then repeat for an additional hour.  If count remains less than 10 per hour notify your provider.  Fumigating: Follow your pest control agent's advice as to how long to stay out of your home.  Ventilate the area well before re-entering.  Hemorrhoids:   Most over-the-counter preparations can be used during pregnancy.  Check your medication to see what is safe to use.  It is important to use a stool softener or fiber in your diet and to drink lots of liquids.  If hemorrhoids seem to be getting worse please call the office.   Hot Tubs:  Hot tubs Jacuzzis and saunas are not recommended while pregnant.  These increase your internal body temperature and should be avoided.  Intercourse:  Sexual intercourse is safe during pregnancy as long as you are comfortable, unless otherwise advised by your provider.  Spotting may occur after intercourse; report any bright red bleeding that is heavier than spotting.  Labor:  If you know that you are in labor, please go to the hospital.  If you are unsure, please call the office and let us help you decide what to do.  Lifting, straining, etc:  If your job requires heavy lifting or straining please check with your provider for any limitations.  Generally, you should not lift items heavier than that you can lift simply with your hands and arms (no back muscles)  Painting:  Paint fumes do not harm your pregnancy, but may make you ill and  should be avoided if possible.  Latex or water based paints have less odor than oils.  Use adequate ventilation while painting.  Permanents & Hair Color:  Chemicals in hair dyes are not recommended as they cause increase hair dryness which can increase hair loss during pregnancy.  " Highlighting" and permanents are allowed.  Dye may be absorbed differently and permanents may not hold as well during pregnancy.  Sunbathing:  Use a sunscreen, as skin burns easily during pregnancy.  Drink plenty of fluids; avoid over heating.  Tanning Beds:  Because their possible side effects are still unknown, tanning beds are not recommended.  Ultrasound Scans:  Routine ultrasounds are performed at approximately 20 weeks.  You will be able to see your baby's general anatomy an if you would like to know the gender this can usually be determined as well.  If it is questionable when you conceived you may also receive an ultrasound early in your pregnancy for dating purposes.  Otherwise ultrasound exams are not routinely performed unless there is a medical necessity.  Although you can request a scan we ask that you pay for it when conducted because insurance does not cover " patient request" scans.  Work: If your pregnancy proceeds without complications you may work until your due date, unless your physician or employer advises otherwise.  Round Ligament Pain/Pelvic Discomfort:  Sharp, shooting pains not associated with bleeding are fairly common, usually occurring in the second trimester of pregnancy.  They tend to be worse when standing up or when you remain standing for long periods of time.  These are the result of pressure of certain pelvic ligaments called "  round ligaments".  Rest, Tylenol and heat seem to be the most effective relief.  As the womb and fetus grow, they rise out of the pelvis and the discomfort improves.  Please notify the office if your pain seems different than that described.  It may represent a more  serious condition.

## 2015-06-26 NOTE — Progress Notes (Signed)
Angie FavaGina M Priest presents for NOB nurse interview visit. G3 -.  P1011. Pt seen at [facility] on 06/11/15 for abdominal pain. Pos Beta- 2695. Abnormal urine culture. NO meds given. Pt c/o today of lower pelvic pain.  NO u/s performed.   Pregnancy education material explained and given. _0__ cats in the home. NOB labs ordered.  HIV and Drug screen were explained and ordered. PNV encouraged. Pt complained of mild nausea. Citra natal B calm erx.  NT to discuss with provider. Unsure of lmp. Will order dating scan asap.  Pt. To follow up with provider in _+/-3_ weeks for NOB physical with [facility].  All questions answered

## 2015-06-27 ENCOUNTER — Ambulatory Visit (INDEPENDENT_AMBULATORY_CARE_PROVIDER_SITE_OTHER): Payer: Medicaid Other

## 2015-06-27 DIAGNOSIS — Z3687 Encounter for antenatal screening for uncertain dates: Secondary | ICD-10-CM

## 2015-06-27 DIAGNOSIS — Z36 Encounter for antenatal screening of mother: Secondary | ICD-10-CM | POA: Diagnosis not present

## 2015-06-27 LAB — MONITOR DRUG PROFILE 14(MW)
AMPHETAMINE SCREEN URINE: NEGATIVE ng/mL
BARBITURATE SCREEN URINE: NEGATIVE ng/mL
BENZODIAZEPINE SCREEN, URINE: NEGATIVE ng/mL
BUPRENORPHINE, URINE: NEGATIVE ng/mL
CANNABINOIDS UR QL SCN: NEGATIVE ng/mL
COCAINE(METAB.)SCREEN, URINE: NEGATIVE ng/mL
Creatinine(Crt), U: 62.7 mg/dL (ref 20.0–300.0)
Fentanyl, Urine: NEGATIVE pg/mL
MEPERIDINE SCREEN, URINE: NEGATIVE ng/mL
Methadone Screen, Urine: NEGATIVE ng/mL
OXYCODONE+OXYMORPHONE UR QL SCN: NEGATIVE ng/mL
Opiate Scrn, Ur: NEGATIVE ng/mL
PH UR, DRUG SCRN: 5.9 (ref 4.5–8.9)
PHENCYCLIDINE QUANTITATIVE URINE: NEGATIVE ng/mL
PROPOXYPHENE SCREEN URINE: NEGATIVE ng/mL
SPECIFIC GRAVITY: 1.01
Tramadol Screen, Urine: NEGATIVE ng/mL

## 2015-06-27 LAB — GC/CHLAMYDIA PROBE AMP
Chlamydia trachomatis, NAA: NEGATIVE
NEISSERIA GONORRHOEAE BY PCR: NEGATIVE

## 2015-06-27 LAB — NICOTINE SCREEN, URINE: Cotinine Ql Scrn, Ur: POSITIVE ng/mL

## 2015-06-28 LAB — ABO AND RH: Rh Factor: POSITIVE

## 2015-06-28 LAB — CBC WITH DIFFERENTIAL/PLATELET
Basophils Absolute: 0 10*3/uL (ref 0.0–0.2)
Basos: 0 %
EOS (ABSOLUTE): 0 10*3/uL (ref 0.0–0.4)
EOS: 1 %
HEMATOCRIT: 39.2 % (ref 34.0–46.6)
HEMOGLOBIN: 13.6 g/dL (ref 11.1–15.9)
Immature Grans (Abs): 0 10*3/uL (ref 0.0–0.1)
Immature Granulocytes: 0 %
LYMPHS ABS: 1.6 10*3/uL (ref 0.7–3.1)
Lymphs: 24 %
MCH: 29.2 pg (ref 26.6–33.0)
MCHC: 34.7 g/dL (ref 31.5–35.7)
MCV: 84 fL (ref 79–97)
MONOCYTES: 7 %
MONOS ABS: 0.5 10*3/uL (ref 0.1–0.9)
Neutrophils Absolute: 4.5 10*3/uL (ref 1.4–7.0)
Neutrophils: 68 %
Platelets: 200 10*3/uL (ref 150–379)
RBC: 4.66 x10E6/uL (ref 3.77–5.28)
RDW: 14.3 % (ref 12.3–15.4)
WBC: 6.6 10*3/uL (ref 3.4–10.8)

## 2015-06-28 LAB — VARICELLA ZOSTER ANTIBODY, IGG: VARICELLA: 154 {index} — AB (ref >165)

## 2015-06-28 LAB — HEPATITIS B SURFACE ANTIGEN: HEP B S AG: NEGATIVE

## 2015-06-28 LAB — ANTIBODY SCREEN: Antibody Screen: NEGATIVE

## 2015-06-28 LAB — URINE CULTURE, OB REFLEX

## 2015-06-28 LAB — CULTURE, OB URINE

## 2015-06-28 LAB — RPR: RPR: NONREACTIVE

## 2015-06-28 LAB — HIV ANTIBODY (ROUTINE TESTING W REFLEX): HIV Screen 4th Generation wRfx: NONREACTIVE

## 2015-06-28 LAB — RUBELLA SCREEN: RUBELLA: 2.59 {index} (ref >0.99)

## 2015-06-30 ENCOUNTER — Other Ambulatory Visit: Payer: Self-pay | Admitting: Obstetrics and Gynecology

## 2015-06-30 DIAGNOSIS — Z283 Underimmunization status: Principal | ICD-10-CM

## 2015-06-30 DIAGNOSIS — O09899 Supervision of other high risk pregnancies, unspecified trimester: Secondary | ICD-10-CM | POA: Insufficient documentation

## 2015-06-30 MED ORDER — NITROFURANTOIN MONOHYD MACRO 100 MG PO CAPS
100.0000 mg | ORAL_CAPSULE | Freq: Two times a day (BID) | ORAL | Status: DC
Start: 2015-06-30 — End: 2016-01-12

## 2015-07-28 ENCOUNTER — Encounter: Payer: Self-pay | Admitting: Obstetrics and Gynecology

## 2015-07-28 ENCOUNTER — Other Ambulatory Visit: Payer: Self-pay | Admitting: Obstetrics and Gynecology

## 2015-07-28 ENCOUNTER — Ambulatory Visit (INDEPENDENT_AMBULATORY_CARE_PROVIDER_SITE_OTHER): Payer: Medicaid Other | Admitting: Obstetrics and Gynecology

## 2015-07-28 VITALS — BP 143/83 | HR 101 | Wt 202.8 lb

## 2015-07-28 DIAGNOSIS — Z789 Other specified health status: Secondary | ICD-10-CM

## 2015-07-28 DIAGNOSIS — Z283 Underimmunization status: Secondary | ICD-10-CM

## 2015-07-28 DIAGNOSIS — O09899 Supervision of other high risk pregnancies, unspecified trimester: Secondary | ICD-10-CM

## 2015-07-28 DIAGNOSIS — Z3492 Encounter for supervision of normal pregnancy, unspecified, second trimester: Secondary | ICD-10-CM

## 2015-07-28 DIAGNOSIS — E663 Overweight: Secondary | ICD-10-CM | POA: Insufficient documentation

## 2015-07-28 LAB — POCT URINALYSIS DIPSTICK
BILIRUBIN UA: NEGATIVE
GLUCOSE UA: NEGATIVE
Ketones, UA: NEGATIVE
NITRITE UA: NEGATIVE
PH UA: 6
Protein, UA: NEGATIVE
RBC UA: NEGATIVE
Spec Grav, UA: <=1.005
Urobilinogen, UA: 0.2

## 2015-07-28 NOTE — Progress Notes (Signed)
NOB- pt is doing well, denies any complaints 

## 2015-07-28 NOTE — Patient Instructions (Signed)

## 2015-07-28 NOTE — Progress Notes (Signed)
NEW OB HISTORY AND PHYSICAL  SUBJECTIVE:       Jasmine Gray is a 25 y.o. 583P1011 female, Patient's last menstrual period was 04/29/2015 (approximate)., Estimated Date of Delivery: 02/16/16, 6013w0d, presents today for establishment of Prenatal Care. She has no unusual complaints and complains of tiredness      Gynecologic History Patient's last menstrual period was 04/29/2015 (approximate). Unknown Contraception: none Last Pap: 2014. Results were: normal  Obstetric History OB History  Gravida Para Term Preterm AB SAB TAB Ectopic Multiple Living  3 1 1  1 1    1     # Outcome Date GA Lbr Len/2nd Weight Sex Delivery Anes PTL Lv  3 Current           2 Term 2016   5 lb 1.9 oz (2.322 kg) F Vag-Spont   Y  1 SAB 2014              Past Medical History  Diagnosis Date  . Tobacco abuse   . Acid reflux     Past Surgical History  Procedure Laterality Date  . No past surgeries      Current Outpatient Prescriptions on File Prior to Visit  Medication Sig Dispense Refill  . Prenat w/o A FeCbnFeGlu-FA &B6 (CITRANATAL B-CALM) 20-1 & 25 (2) MG MISC Take 20 mg by mouth daily. 30 each 11  . nitrofurantoin, macrocrystal-monohydrate, (MACROBID) 100 MG capsule Take 1 capsule (100 mg total) by mouth 2 (two) times daily. (Patient not taking: Reported on 07/28/2015) 14 capsule 1   No current facility-administered medications on file prior to visit.    Allergies  Allergen Reactions  . Penicillins Hives    Social History   Social History  . Marital Status: Single    Spouse Name: N/A  . Number of Children: N/A  . Years of Education: N/A   Occupational History  . Not on file.   Social History Main Topics  . Smoking status: Current Every Day Smoker -- 0.25 packs/day    Types: Cigarettes  . Smokeless tobacco: Never Used  . Alcohol Use: No  . Drug Use: No  . Sexual Activity: Yes    Birth Control/ Protection: None   Other Topics Concern  . Not on file   Social History Narrative     Family History  Problem Relation Age of Onset  . Heart disease Father   . Cancer Neg Hx   . Depression Neg Hx     The following portions of the patient's history were reviewed and updated as appropriate: allergies, current medications, past OB history, past medical history, past surgical history, past family history, past social history, and problem list.    OBJECTIVE: Initial Physical Exam (New OB)  GENERAL APPEARANCE: alert, well appearing, in no apparent distress, oriented to person, place and time HEAD: normocephalic, atraumatic MOUTH: mucous membranes moist, pharynx normal without lesions and dental hygiene poor THYROID: no thyromegaly or masses present BREASTS: not examined LUNGS: clear to auscultation, no wheezes, rales or rhonchi, symmetric air entry HEART: regular rate and rhythm, no murmurs ABDOMEN: soft, nontender, nondistended, no abnormal masses, no epigastric pain, fundus not palpable and FHT present EXTREMITIES: no redness or tenderness in the calves or thighs SKIN: normal coloration and turgor, no rashes LYMPH NODES: no adenopathy palpable NEUROLOGIC: alert, oriented, normal speech, no focal findings or movement disorder noted  PELVIC EXAM EXTERNAL GENITALIA: normal appearing vulva with no masses, tenderness or lesions VAGINA: no abnormal discharge or lesions UTERUS: gravid and  consistent with 12 weeks ADNEXA: no masses palpable and nontender  ASSESSMENT: Normal pregnancy Varicella NI Overweight smoker  PLAN: Prenatal care Desires NT and CFP- will schedule See orders Counseled on smoking cessation

## 2015-07-31 LAB — CYTOLOGY - PAP

## 2015-08-11 ENCOUNTER — Ambulatory Visit (INDEPENDENT_AMBULATORY_CARE_PROVIDER_SITE_OTHER): Payer: Medicaid Other

## 2015-08-11 ENCOUNTER — Other Ambulatory Visit: Payer: Self-pay | Admitting: Obstetrics and Gynecology

## 2015-08-11 DIAGNOSIS — Z3492 Encounter for supervision of normal pregnancy, unspecified, second trimester: Secondary | ICD-10-CM

## 2015-08-15 LAB — FIRST TRIMESTER SCREEN W/NT
CRL: 73.2 mm
DIA MOM: 1.71
DIA VALUE: 317.1 pg/mL
GEST AGE-COLLECT: 13.1 wk
HCG VALUE: 46.9 [IU]/mL
Maternal Age At EDD: 24.1 years
NUCHAL TRANSLUCENCY: 1.9 mm
NUMBER OF FETUSES: 1
Nuchal Translucency MoM: 1.06
PAPP-A MoM: 0.66
PAPP-A Value: 555.2 ng/mL
PDF: 0
TEST RESULTS: NEGATIVE
Weight: 202 [lb_av]
hCG MoM: 0.71

## 2015-09-01 ENCOUNTER — Ambulatory Visit (INDEPENDENT_AMBULATORY_CARE_PROVIDER_SITE_OTHER): Payer: Medicaid Other | Admitting: Obstetrics and Gynecology

## 2015-09-01 VITALS — BP 140/77 | HR 109 | Wt 200.6 lb

## 2015-09-01 DIAGNOSIS — Z3492 Encounter for supervision of normal pregnancy, unspecified, second trimester: Secondary | ICD-10-CM

## 2015-09-01 DIAGNOSIS — R519 Headache, unspecified: Secondary | ICD-10-CM

## 2015-09-01 DIAGNOSIS — R51 Headache: Secondary | ICD-10-CM

## 2015-09-01 LAB — POCT URINALYSIS DIPSTICK
BILIRUBIN UA: NEGATIVE
Blood, UA: NEGATIVE
GLUCOSE UA: NEGATIVE
Ketones, UA: NEGATIVE
LEUKOCYTES UA: NEGATIVE
NITRITE UA: NEGATIVE
Protein, UA: NEGATIVE
Spec Grav, UA: <=1.005
Urobilinogen, UA: 0.2
pH, UA: 6

## 2015-09-01 MED ORDER — BUTALBITAL-APAP-CAFF-COD 50-325-40-30 MG PO CAPS: 1.0000 | ORAL_CAPSULE | ORAL | 0 refills | Status: DC | PRN

## 2015-09-01 NOTE — Progress Notes (Signed)
ROB-reports daily headache about 6pm not relieved by tylenol- just has to try to sleep- Rx Fiorecet sent in; anatomy scan next visit.

## 2015-09-01 NOTE — Progress Notes (Signed)
ROB- pt is having a lot of headaches x 1 week, dizzy spells

## 2015-09-22 ENCOUNTER — Ambulatory Visit (INDEPENDENT_AMBULATORY_CARE_PROVIDER_SITE_OTHER): Payer: Medicaid Other

## 2015-09-22 ENCOUNTER — Ambulatory Visit (INDEPENDENT_AMBULATORY_CARE_PROVIDER_SITE_OTHER): Payer: Medicaid Other | Admitting: Obstetrics and Gynecology

## 2015-09-22 VITALS — BP 120/72 | HR 100 | Wt 199.9 lb

## 2015-09-22 DIAGNOSIS — Z3492 Encounter for supervision of normal pregnancy, unspecified, second trimester: Secondary | ICD-10-CM | POA: Diagnosis not present

## 2015-09-22 LAB — POCT URINALYSIS DIPSTICK
Bilirubin, UA: NEGATIVE
Blood, UA: NEGATIVE
Glucose, UA: NEGATIVE
Ketones, UA: NEGATIVE
Leukocytes, UA: NEGATIVE
Nitrite, UA: NEGATIVE
Protein, UA: NEGATIVE
Spec Grav, UA: 1.01
Urobilinogen, UA: 0.2
pH, UA: 6

## 2015-09-22 NOTE — Progress Notes (Signed)
ROB- doing well, headaches are gone

## 2015-09-22 NOTE — Progress Notes (Signed)
ROB- anatomy scan done today, pt is doing well 

## 2015-10-25 ENCOUNTER — Ambulatory Visit (INDEPENDENT_AMBULATORY_CARE_PROVIDER_SITE_OTHER): Payer: Medicaid Other | Admitting: Obstetrics and Gynecology

## 2015-10-25 VITALS — BP 132/78 | HR 96 | Wt 207.0 lb

## 2015-10-25 DIAGNOSIS — Z23 Encounter for immunization: Secondary | ICD-10-CM | POA: Diagnosis not present

## 2015-10-25 DIAGNOSIS — Z3492 Encounter for supervision of normal pregnancy, unspecified, second trimester: Secondary | ICD-10-CM

## 2015-10-25 LAB — POCT URINALYSIS DIPSTICK
Bilirubin, UA: NEGATIVE
Blood, UA: NEGATIVE
Glucose, UA: NEGATIVE
Ketones, UA: NEGATIVE
LEUKOCYTES UA: NEGATIVE
NITRITE UA: NEGATIVE
PROTEIN UA: NEGATIVE
Spec Grav, UA: <=1.005
Urobilinogen, UA: 0.2
pH, UA: 6.5

## 2015-10-25 NOTE — Patient Instructions (Signed)
Postpartum Tubal Ligation Postpartum tubal ligation (PPTL) is a procedure that closes the fallopian tubes right after childbirth or 1-2 days after childbirth. PPTL is done before the uterus returns to its normal location. The procedure is also called a mini-laparotomy. When the fallopian tubes are closed, the eggs that are released from the ovaries cannot enter the uterus, and sperm cannot reach the egg. PPTL is done so you will not be able to get pregnant or have a baby. Although this procedure may be undone (reversed), it should be considered permanent and irreversible. If you want to have future pregnancies, you should not have this procedure. LET YOUR HEALTH CARE PROVIDER KNOW ABOUT:  Any allergies you have.  All medicines you are taking, including vitamins, herbs, eye drops, creams, and over-the-counter medicines. This includes any use of steroids, either by mouth or in cream form.  Previous problems you or members of your family have had with the use of anesthetics.  Any blood disorders you have.  Previous surgeries you have had.  Any medical conditions you may have. RISKS AND COMPLICATIONS  Infection.  Bleeding.  Injury to surrounding organs.  Side effects from anesthetics.  Failure of the procedure.  Ectopic pregnancy.  Future regret about having the procedure done. BEFORE THE PROCEDURE  You may need to sign certain documents, including an informed consent form, up to 30 days before the date of your tubal ligation.  Follow instructions from your health care provider about eating and drinking restrictions. PROCEDURE  If done 1-2 days after a vaginal delivery:  You will be given one or more of the following:  A medicine that helps you relax (sedative).  A medicine that numbs the area (local anesthetic).  A medicine that makes you fall asleep (general anesthetic).  A medicine that is injected into an area of your body that numbs everything below the injection site  (regional anesthetic).  If you have been given general anesthetic, a tube will be put down your throat to help you breathe.  Your bladder may be emptied with a small tube (catheter).  A small cut (incision) will be made just above the pubic hair line.  The fallopian tubes will be located and brought up through the incision.  The fallopian tubes will be tied off or burned (cauterized), or they will be closed with a clamp, ring, or clip. In many cases, a small portion in the center of each fallopian tube will also be removed.  The incision will be closed with stitches (sutures).  A bandage (dressing) will be placed over the incision. The procedure may vary among health care providers and hospitals. If done after a cesarean delivery:  Tubal ligation will be done through the incision that was used for the cesarean delivery of your baby.  After the tubes are closed, the incision will be closed with stitches (sutures).  A bandage (dressing) will be placed over the incision. The procedure may vary among health care providers and hospitals. AFTER THE PROCEDURE  Your blood pressure, heart rate, breathing rate, and blood oxygen level will be monitored often until the medicines you were given have worn off.  You will be given pain medicine as needed.  If you had general anesthetic, you may have some mild discomfort in your throat. This is from the breathing tube that was placed in your throat while you were sleeping.  You may feel tired, and you should rest for the remainder of the day.  You may have some pain   or cramps in the abdominal area for 3-7 days.   This information is not intended to replace advice given to you by your health care provider. Make sure you discuss any questions you have with your health care provider.   Document Released: 01/21/2005 Document Revised: 06/07/2014 Document Reviewed: 05/04/2011 Elsevier Interactive Patient Education 2016 [employer]  

## 2015-10-25 NOTE — Progress Notes (Signed)
Rob-DOING WELL, flu vaccine given. Interested in water birth- explained about classes. Discussed PPTL.

## 2015-10-25 NOTE — Progress Notes (Signed)
ROB- pt is doing well, flu vaccine given 

## 2015-11-05 DIAGNOSIS — K649 Unspecified hemorrhoids: Secondary | ICD-10-CM

## 2015-11-05 HISTORY — DX: Unspecified hemorrhoids: K64.9

## 2015-11-22 ENCOUNTER — Other Ambulatory Visit: Payer: Self-pay | Admitting: *Deleted

## 2015-11-22 DIAGNOSIS — Z131 Encounter for screening for diabetes mellitus: Secondary | ICD-10-CM

## 2015-11-24 ENCOUNTER — Other Ambulatory Visit: Payer: Medicaid Other

## 2015-11-24 ENCOUNTER — Ambulatory Visit (INDEPENDENT_AMBULATORY_CARE_PROVIDER_SITE_OTHER): Payer: Medicaid Other | Admitting: Obstetrics and Gynecology

## 2015-11-24 VITALS — BP 110/79 | HR 105 | Wt 209.7 lb

## 2015-11-24 DIAGNOSIS — Z23 Encounter for immunization: Secondary | ICD-10-CM

## 2015-11-24 DIAGNOSIS — Z3493 Encounter for supervision of normal pregnancy, unspecified, third trimester: Secondary | ICD-10-CM | POA: Diagnosis not present

## 2015-11-24 DIAGNOSIS — Z131 Encounter for screening for diabetes mellitus: Secondary | ICD-10-CM

## 2015-11-24 LAB — POCT URINALYSIS DIPSTICK
BILIRUBIN UA: NEGATIVE
GLUCOSE UA: NEGATIVE
KETONES UA: NEGATIVE
Leukocytes, UA: NEGATIVE
Nitrite, UA: NEGATIVE
Protein, UA: NEGATIVE
RBC UA: NEGATIVE
SPEC GRAV UA: 1.01
UROBILINOGEN UA: 0.2
pH, UA: 6

## 2015-11-24 MED ORDER — TETANUS-DIPHTH-ACELL PERTUSSIS 5-2.5-18.5 LF-MCG/0.5 IM SUSP
0.5000 mL | Freq: Once | INTRAMUSCULAR | Status: AC
Start: 2015-11-24 — End: 2015-11-24
  Administered 2015-11-24: 0.5 mL via INTRAMUSCULAR

## 2015-11-24 NOTE — Progress Notes (Signed)
ROB- glucola done today, blood consent signed, having some low back pain

## 2015-11-24 NOTE — Patient Instructions (Signed)

## 2015-11-24 NOTE — Progress Notes (Signed)
ROB-doing well except low back pain, signed medicaid tubal papers, glucola done

## 2015-11-25 LAB — HEMOGLOBIN AND HEMATOCRIT, BLOOD
HEMATOCRIT: 35 % (ref 34.0–46.6)
HEMOGLOBIN: 12 g/dL (ref 11.1–15.9)

## 2015-11-25 LAB — GLUCOSE, 1 HOUR GESTATIONAL: GESTATIONAL DIABETES SCREEN: 93 mg/dL (ref 65–139)

## 2015-12-06 ENCOUNTER — Encounter: Payer: Self-pay | Admitting: *Deleted

## 2015-12-06 ENCOUNTER — Inpatient Hospital Stay
Admission: EM | Admit: 2015-12-06 | Discharge: 2015-12-06 | Disposition: A | Discharge status: Home / Self Care | Payer: Medicaid Other | Attending: Obstetrics and Gynecology | Admitting: Obstetrics and Gynecology

## 2015-12-06 DIAGNOSIS — Z3A33 33 weeks gestation of pregnancy: Secondary | ICD-10-CM | POA: Insufficient documentation

## 2015-12-06 DIAGNOSIS — N939 Abnormal uterine and vaginal bleeding, unspecified: Secondary | ICD-10-CM

## 2015-12-06 DIAGNOSIS — O26853 Spotting complicating pregnancy, third trimester: Secondary | ICD-10-CM | POA: Insufficient documentation

## 2015-12-06 DIAGNOSIS — Z283 Underimmunization status: Secondary | ICD-10-CM

## 2015-12-06 DIAGNOSIS — O2243 Hemorrhoids in pregnancy, third trimester: Secondary | ICD-10-CM | POA: Diagnosis not present

## 2015-12-06 DIAGNOSIS — O09899 Supervision of other high risk pregnancies, unspecified trimester: Secondary | ICD-10-CM

## 2015-12-06 HISTORY — DX: Unspecified hemorrhoids: K64.9

## 2015-12-06 NOTE — Progress Notes (Signed)
Pt states that she recently had a hemorrhoid develop on Sat, externally. Pt states that she has not been constipated or staining. Has never had such before. Pt noted to have pinkish light d/c on perineum and around anus, external hemorrhoids viewed, one bleeding. CNM stating that pt maybe d/c'ed, to use Preparation H as directed on tube, increase fluid intake at home, particularly water. Pt to be re-evaluated at next appt with CNM Melody, or earlier if needed.

## 2015-12-06 NOTE — OB Triage Note (Signed)
Pt states last noted fetal movement at 1200 today, and currently feeling baby kick ly. Pt also states that she has a 50 cent spot of dark pinky mucus at approx 2045. States she has been having lots of Deberah PeltonBraxton Hicks lately- approx 1-2 an hour - mostly in pm

## 2015-12-06 NOTE — Discharge Instructions (Signed)
Pt to keep next appt with CNM Galen ManilaM Shambley for re evaluation, or sooner if needed. Pt to use Preparation H as per tube directions Pt to increase fluid intake, particularly water

## 2015-12-08 ENCOUNTER — Ambulatory Visit (INDEPENDENT_AMBULATORY_CARE_PROVIDER_SITE_OTHER): Payer: Medicaid Other | Admitting: Obstetrics and Gynecology

## 2015-12-08 VITALS — BP 123/73 | HR 81 | Wt 207.6 lb

## 2015-12-08 DIAGNOSIS — Z3493 Encounter for supervision of normal pregnancy, unspecified, third trimester: Secondary | ICD-10-CM

## 2015-12-08 LAB — POCT URINALYSIS DIPSTICK
Bilirubin, UA: NEGATIVE
Blood, UA: NEGATIVE
GLUCOSE UA: NEGATIVE
KETONES UA: NEGATIVE
LEUKOCYTES UA: NEGATIVE
Nitrite, UA: NEGATIVE
PROTEIN UA: NEGATIVE
SPEC GRAV UA: 1.01
UROBILINOGEN UA: 0.2
pH, UA: 6

## 2015-12-08 NOTE — Progress Notes (Signed)
ROB- see student note, agree with assessment.

## 2015-12-08 NOTE — Progress Notes (Signed)
ROB- Patient denies any complaints or concerns at this time. Baby moving well.  Reports eating and hydrating well despite weight loss. States only using 5 cigarettes/day. Counseled on decreasing cigarettes even more if possible.

## 2015-12-08 NOTE — Progress Notes (Signed)
ROB- pt is doing well 

## 2015-12-22 ENCOUNTER — Ambulatory Visit (INDEPENDENT_AMBULATORY_CARE_PROVIDER_SITE_OTHER): Payer: Medicaid Other | Admitting: Obstetrics and Gynecology

## 2015-12-22 VITALS — BP 138/69 | HR 94 | Wt 212.7 lb

## 2015-12-22 DIAGNOSIS — Z5181 Encounter for therapeutic drug level monitoring: Secondary | ICD-10-CM

## 2015-12-22 DIAGNOSIS — Z3493 Encounter for supervision of normal pregnancy, unspecified, third trimester: Secondary | ICD-10-CM

## 2015-12-22 LAB — POCT URINALYSIS DIPSTICK
BILIRUBIN UA: NEGATIVE
Blood, UA: NEGATIVE
GLUCOSE UA: NEGATIVE
KETONES UA: NEGATIVE
Leukocytes, UA: NEGATIVE
Nitrite, UA: NEGATIVE
Protein, UA: NEGATIVE
Urobilinogen, UA: 0.2
pH, UA: 6

## 2015-12-22 NOTE — Progress Notes (Signed)
ROB: Overall doing well. Does have some intermittent back pain that resolves on it's own. Discussed that could use Tylenol for discomfort if needed but if pain was persistent or urinary symptoms developed to notify us to rule out UTI. Patient verbalized understanding. Follow up as scheduled in 2 weeks.

## 2015-12-22 NOTE — Progress Notes (Signed)
ROB- pt is doing well, last time pt was here im sure scales was off- so we discussed the weight

## 2015-12-22 NOTE — Addendum Note (Signed)
Addended by: Rosine BeatLONTZ, Eyoel Throgmorton L on: 12/22/2015 03:31 PM   Modules accepted: Orders

## 2015-12-22 NOTE — Patient Instructions (Signed)
Place 24-38 weeks prenatal visit patient instructions here.

## 2015-12-23 LAB — MONITOR DRUG PROFILE 14(MW)
AMPHETAMINE SCREEN URINE: NEGATIVE ng/mL
BARBITURATE SCREEN URINE: NEGATIVE ng/mL
BENZODIAZEPINE SCREEN, URINE: NEGATIVE ng/mL
Buprenorphine, Urine: NEGATIVE ng/mL
CANNABINOIDS UR QL SCN: NEGATIVE ng/mL
CREATININE(CRT), U: 22.2 mg/dL (ref 20.0–300.0)
Cocaine (Metab) Scrn, Ur: NEGATIVE ng/mL
FENTANYL, URINE: NEGATIVE pg/mL
Meperidine Screen, Urine: NEGATIVE ng/mL
Methadone Screen, Urine: NEGATIVE ng/mL
OPIATE SCREEN URINE: NEGATIVE ng/mL
OXYCODONE+OXYMORPHONE UR QL SCN: NEGATIVE ng/mL
Ph of Urine: 6.8 (ref 4.5–8.9)
Phencyclidine Qn, Ur: NEGATIVE ng/mL
Propoxyphene Scrn, Ur: NEGATIVE ng/mL
SPECIFIC GRAVITY: 1.004
TRAMADOL SCREEN, URINE: NEGATIVE ng/mL

## 2015-12-30 NOTE — OB Triage Provider Note (Signed)
L&D OB Triage Note  Jasmine Gray is a 25 y.o. 863P1011 female at 7547w4d, EDD Estimated Date of Delivery: 02/13/16 who presented to triage for complaints of spotting noted on tissue when wiped, does have a small painful hemorrhoid.  She was evaluated by the nurses with no significant findings/findings significant for vaginal bleeding or labor. Vital signs stable. An NST was performed and has been reviewed by Me. She was instructed to use OTC prepH as needed..   NST INTERPRETATION: Indications: rule out uterine contractions  Mode: External Baseline Rate (A): 140 bpm Variability: Moderate Accelerations: 15 x 15 Decelerations: Variable     Contraction Frequency (min): query irritability  Impression: reactive   Plan: NST performed was reviewed and was found to be reactive. She was discharged home with bleeding/labor precautions.  Continue routine prenatal care. Follow up with OB/GYN as previously scheduled.     Makila Colombe Suzan NailerN Maebry Obrien, CNM

## 2016-01-06 ENCOUNTER — Observation Stay (HOSPITAL_BASED_OUTPATIENT_CLINIC_OR_DEPARTMENT_OTHER)
Admission: EM | Admit: 2016-01-06 | Discharge: 2016-01-06 | Disposition: A | Discharge status: Home / Self Care | Payer: Medicaid Other | Source: Home / Self Care | Admitting: Obstetrics and Gynecology

## 2016-01-06 DIAGNOSIS — O471 False labor at or after 37 completed weeks of gestation: Secondary | ICD-10-CM | POA: Insufficient documentation

## 2016-01-06 DIAGNOSIS — O09899 Supervision of other high risk pregnancies, unspecified trimester: Secondary | ICD-10-CM

## 2016-01-06 DIAGNOSIS — O0992 Supervision of high risk pregnancy, unspecified, second trimester: Secondary | ICD-10-CM

## 2016-01-06 DIAGNOSIS — Z3490 Encounter for supervision of normal pregnancy, unspecified, unspecified trimester: Secondary | ICD-10-CM

## 2016-01-06 DIAGNOSIS — Z3A34 34 weeks gestation of pregnancy: Secondary | ICD-10-CM

## 2016-01-06 DIAGNOSIS — R1030 Lower abdominal pain, unspecified: Secondary | ICD-10-CM | POA: Diagnosis not present

## 2016-01-06 DIAGNOSIS — Z3493 Encounter for supervision of normal pregnancy, unspecified, third trimester: Secondary | ICD-10-CM

## 2016-01-06 DIAGNOSIS — Z283 Underimmunization status: Principal | ICD-10-CM

## 2016-01-06 NOTE — Final Progress Note (Signed)
L&D OB Triage Note  Jasmine Gray is a 25 y.o. 563P1011 female at 2922w4d, EDD Estimated Date of Delivery: 02/13/16 who presented to triage for complaints of leaking fluid since 0930 this morning and cramping. Of note, patient had intercourse the night prior. She was evaluated by the nurses with no significant findingsfor ruptured membranes.  Nitrazine test negative x 2.   Vital signs stable. An NST was performed and has been reviewed by MD.    NST INTERPRETATION: Indications: rule out uterine contractions  Mode: External Baseline Rate (A): 145 bpm Variability: Moderate Accelerations: 15 x 15 Decelerations: None     Contraction Frequency (min): irregular  Impression: reactive   Plan: NST performed was reviewed and was found to be reactive. She was discharged home with bleeding/preterm labor precautions.  She can take Tylenol for cramping. Continue routine prenatal care. Follow up with OB/GYN as previously scheduled.     Hildred LaserAnika Nashaun Hillmer, MD [facility]

## 2016-01-06 NOTE — OB Triage Note (Addendum)
Pt presents c/o LOF around 0930 this morning along with some cramps in her lower abdomen. Pt reports having intercourse last night. Negative nitrazine X 2. Denies any bleeding.

## 2016-01-06 NOTE — Progress Notes (Addendum)
Pt had negative Nitrazine X 3. When she first got here, we Nitrazined her pad and had her cough, both were negative.  Also before discharge we rechecked because she said she felt more leaking out and that one was negative as well. Discharge noted to be milky white mucous consistency, small amount.

## 2016-01-07 ENCOUNTER — Inpatient Hospital Stay
Admission: EM | Admit: 2016-01-07 | Discharge: 2016-01-12 | DRG: 775 | Disposition: A | Discharge status: Home / Self Care | Payer: Medicaid Other | Attending: Obstetrics and Gynecology | Admitting: Obstetrics and Gynecology

## 2016-01-07 ENCOUNTER — Encounter: Payer: Self-pay | Admitting: *Deleted

## 2016-01-07 ENCOUNTER — Inpatient Hospital Stay: Payer: Medicaid Other

## 2016-01-07 DIAGNOSIS — Z3A34 34 weeks gestation of pregnancy: Secondary | ICD-10-CM | POA: Diagnosis not present

## 2016-01-07 DIAGNOSIS — O42011 Preterm premature rupture of membranes, onset of labor within 24 hours of rupture, first trimester: Secondary | ICD-10-CM | POA: Diagnosis present

## 2016-01-07 DIAGNOSIS — O2243 Hemorrhoids in pregnancy, third trimester: Secondary | ICD-10-CM | POA: Diagnosis present

## 2016-01-07 DIAGNOSIS — F17210 Nicotine dependence, cigarettes, uncomplicated: Secondary | ICD-10-CM | POA: Diagnosis present

## 2016-01-07 DIAGNOSIS — Z880 Allergy status to penicillin: Secondary | ICD-10-CM | POA: Diagnosis not present

## 2016-01-07 DIAGNOSIS — O42919 Preterm premature rupture of membranes, unspecified as to length of time between rupture and onset of labor, unspecified trimester: Secondary | ICD-10-CM

## 2016-01-07 DIAGNOSIS — O42013 Preterm premature rupture of membranes, onset of labor within 24 hours of rupture, third trimester: Secondary | ICD-10-CM | POA: Diagnosis not present

## 2016-01-07 DIAGNOSIS — Z23 Encounter for immunization: Secondary | ICD-10-CM | POA: Diagnosis not present

## 2016-01-07 DIAGNOSIS — Z8249 Family history of ischemic heart disease and other diseases of the circulatory system: Secondary | ICD-10-CM | POA: Diagnosis not present

## 2016-01-07 DIAGNOSIS — O99334 Smoking (tobacco) complicating childbirth: Secondary | ICD-10-CM | POA: Diagnosis present

## 2016-01-07 DIAGNOSIS — O09899 Supervision of other high risk pregnancies, unspecified trimester: Secondary | ICD-10-CM

## 2016-01-07 DIAGNOSIS — Z283 Underimmunization status: Secondary | ICD-10-CM

## 2016-01-07 LAB — CBC
HEMATOCRIT: 38.1 % (ref 35.0–47.0)
HEMOGLOBIN: 13.4 g/dL (ref 12.0–16.0)
MCH: 29.7 pg (ref 26.0–34.0)
MCHC: 35.3 g/dL (ref 32.0–36.0)
MCV: 84.1 fL (ref 80.0–100.0)
Platelets: 247 10*3/uL (ref 150–440)
RBC: 4.52 MIL/uL (ref 3.80–5.20)
RDW: 13.7 % (ref 11.5–14.5)
WBC: 11.1 10*3/uL — AB (ref 3.6–11.0)

## 2016-01-07 LAB — URINALYSIS COMPLETE WITH MICROSCOPIC (ARMC ONLY)
Bacteria, UA: NONE SEEN
Bilirubin Urine: NEGATIVE
Glucose, UA: NEGATIVE mg/dL
KETONES UR: NEGATIVE mg/dL
LEUKOCYTES UA: NEGATIVE
Nitrite: NEGATIVE
PH: 7 (ref 5.0–8.0)
PROTEIN: NEGATIVE mg/dL
RBC / HPF: NONE SEEN RBC/hpf (ref 0–5)
SPECIFIC GRAVITY, URINE: 1.001 — AB (ref 1.005–1.030)
WBC UA: NONE SEEN WBC/hpf (ref 0–5)

## 2016-01-07 LAB — CHLAMYDIA/NGC RT PCR (ARMC ONLY)
Chlamydia Tr: NOT DETECTED
N GONORRHOEAE: NOT DETECTED

## 2016-01-07 LAB — RAPID HIV SCREEN (HIV 1/2 AB+AG)
HIV 1/2 ANTIBODIES: NONREACTIVE
HIV-1 P24 Antigen - HIV24: NONREACTIVE

## 2016-01-07 LAB — TYPE AND SCREEN
ABO/RH(D): A POS
ANTIBODY SCREEN: NEGATIVE

## 2016-01-07 MED ORDER — OXYCODONE-ACETAMINOPHEN 5-325 MG PO TABS: 1.0000 | ORAL_TABLET | ORAL | Status: DC | PRN

## 2016-01-07 MED ORDER — LACTATED RINGERS IV SOLN
500.0000 mL | INTRAVENOUS | Status: DC | PRN
  Administered 2016-01-10: 1000 mL via INTRAVENOUS

## 2016-01-07 MED ORDER — OXYCODONE-ACETAMINOPHEN 5-325 MG PO TABS: 2.0000 | ORAL_TABLET | ORAL | Status: DC | PRN

## 2016-01-07 MED ORDER — CLINDAMYCIN PHOSPHATE 900 MG/50ML IV SOLN
900.0000 mg | Freq: Three times a day (TID) | INTRAVENOUS | Status: DC
  Administered 2016-01-07 – 2016-01-09 (×8): 900 mg via INTRAVENOUS
  Filled 2016-01-07 (×10): qty 50

## 2016-01-07 MED ORDER — LIDOCAINE HCL (PF) 1 % IJ SOLN
30.0000 mL | INTRAMUSCULAR | Status: AC | PRN
  Administered 2016-01-09: 4 mL via SUBCUTANEOUS

## 2016-01-07 MED ORDER — ONDANSETRON HCL 4 MG/2ML IJ SOLN: 4.0000 mg | Freq: Four times a day (QID) | INTRAMUSCULAR | Status: DC | PRN

## 2016-01-07 MED ORDER — OXYTOCIN BOLUS FROM INFUSION
500.0000 mL | Freq: Once | INTRAVENOUS | Status: AC
  Administered 2016-01-10: 500 mL via INTRAVENOUS

## 2016-01-07 MED ORDER — ACETAMINOPHEN 325 MG PO TABS
650.0000 mg | ORAL_TABLET | ORAL | Status: DC | PRN
  Administered 2016-01-08: 650 mg via ORAL
  Filled 2016-01-07: qty 2

## 2016-01-07 MED ORDER — BETAMETHASONE SOD PHOS & ACET 6 (3-3) MG/ML IJ SUSP: 12.0000 mg | Freq: Once | INTRAMUSCULAR | Status: DC

## 2016-01-07 MED ORDER — OXYTOCIN 40 UNITS IN LACTATED RINGERS INFUSION - SIMPLE MED: 2.5000 [IU]/h | INTRAVENOUS | Status: DC

## 2016-01-07 MED ORDER — BETAMETHASONE SOD PHOS & ACET 6 (3-3) MG/ML IJ SUSP
12.0000 mg | Freq: Once | INTRAMUSCULAR | Status: AC
  Administered 2016-01-07 – 2016-01-08 (×2): 12 mg via INTRAMUSCULAR
  Filled 2016-01-07: qty 1

## 2016-01-07 MED ORDER — BUTORPHANOL TARTRATE 1 MG/ML IJ SOLN
1.0000 mg | INTRAMUSCULAR | Status: DC | PRN
  Administered 2016-01-09: 1 mg via INTRAVENOUS
  Filled 2016-01-07: qty 1

## 2016-01-07 MED ORDER — TERBUTALINE SULFATE 1 MG/ML IJ SOLN: 0.2500 mg | Freq: Once | INTRAMUSCULAR | Status: DC | PRN

## 2016-01-07 MED ORDER — LACTATED RINGERS IV SOLN
INTRAVENOUS | Status: DC
  Administered 2016-01-07 – 2016-01-09 (×4): via INTRAVENOUS

## 2016-01-07 MED ORDER — SOD CITRATE-CITRIC ACID 500-334 MG/5ML PO SOLN: 30.0000 mL | ORAL | Status: DC | PRN

## 2016-01-07 MED ORDER — OXYTOCIN 40 UNITS IN LACTATED RINGERS INFUSION - SIMPLE MED
1.0000 m[IU]/min | INTRAVENOUS | Status: DC
  Administered 2016-01-07: 1 m[IU]/min via INTRAVENOUS
  Filled 2016-01-07: qty 1000

## 2016-01-07 NOTE — Progress Notes (Signed)
Intrapartum Progress Note  S: Doing well, no complaints.   O: Blood pressure 120/63, pulse 81, temperature 98.2 F (36.8 C), temperature source Oral, resp. rate 16, height 5\' 8"  (1.727 m), weight 212 lb (96.2 kg), last menstrual period 04/29/2015. Gen App: NAD, comfortable Abdomen: soft, gravid FHT: baseline 130 bpm.  Accels present.  Decels absent. moderate in degree variability.   Tocometer: uterine irritability Cervix: deferred Extremities: Nontender, no edema.  Labs: No new labs   Assessment:  1: SIUP at 6574w5d 2. PPROM  Plan:  1. Patient has returned from ultrasound, is vertex, AFI 7.8 (2.5%ile), measuring 35.0 weeks, EFW 2640 g (63%ile). 2. Will initiate Pitocin for IOL 3. Continue GBS prophylaxis for unknown status with Clindamycin (PCN allergy). Awaiting culture.  4. For 2nd dose of betamethasone at 2:00 pm tomorrow.  5. Continue to monitor for s/s of chorioamnionitis.   Hildred LaserAnika Deneen Slager, MD 01/07/2016 9:08 PM

## 2016-01-07 NOTE — H&P (Addendum)
Obstetric History and Physical  Jasmine FavaGina M Gray is a 25 y.o. [phone] with IUP at 7959w5d presenting for leakage of fluids and blood discharge. Patient states she has been having pelvic cramping 5/10 x 2 days, minimal vaginal bleeding, ruptured, clear fluid membranes, with active fetal movement.   Of note, patient was seen in triage yesterday for complaints of LOF, had intercourse the day prior, nitrazine test x 2 was negative.   Prenatal Course Source of Care: [facility] with onset of care at 8 weeks Pregnancy complications or risks: Patient Active Problem List   Diagnosis Date Noted  . Preterm premature rupture of membranes (PPROM) with onset of labor within 24 hours of rupture in first trimester, antepartum 01/07/2016  . Preterm premature rupture of membranes (PPROM) with unknown onset of labor 01/07/2016  . Pregnancy 01/06/2016  . Overweight (BMI 25.0-29.9) 07/28/2015  . Maternal varicella, non-immune 06/30/2015  . Tobacco user 05/31/2014   She plans to breast and bottle feed. She desires bilateral tubal ligation for postpartum contraception.   Prenatal labs and studies: ABO, Rh: A/Positive/-- (05/22 1428) Antibody: Negative (05/22 1428) Rubella: 2.59 (05/22 1428) RPR: Non Reactive (05/22 1428)  HBsAg: Negative (05/22 1428)  HIV: Non Reactive (05/22 1428)  GBS: Unknown. 1 hr Glucola normal, 93.  Genetic screening normal Anatomy US normal  Past Medical History:  Diagnosis Date  . Acid reflux    on no RX at present  . Hemorrhoid 11/2015   starting on past Sat. Pt states it is on the outside  . Tobacco abuse     Past Surgical History:  Procedure Laterality Date  . NO PAST SURGERIES      OB History  Gravida Para Term Preterm AB Living  3 1 1   1 1   SAB TAB Ectopic Multiple Live Births  1       1    # Outcome Date GA Lbr Len/2nd Weight Sex Delivery Anes PTL Lv  3 Current           2 Term 2016   5 lb 1.9 oz (2.322 kg) F Vag-Spont   LIV  1 SAB 2014             Obstetric Comments  States at beginning of pregnancy felt light headed and ill, resolved    Social History   Social History  . Marital status: Single    Spouse name: N/A  . Number of children: N/A  . Years of education: N/A   Social History Main Topics  . Smoking status: Current Every Day Smoker    Packs/day: 0.25    Years: 5.00    Types: Cigarettes  . Smokeless tobacco: Never Used  . Alcohol use No  . Drug use: No  . Sexual activity: Yes    Birth control/ protection: None   Other Topics Concern  . None   Social History Narrative  . None    Family History  Problem Relation Age of Onset  . Heart disease Father   . Cancer Neg Hx   . Depression Neg Hx     Prescriptions Prior to Admission  Medication Sig Dispense Refill Last Dose  . butalbital-acetaminophen-caffeine (FIORICET WITH CODEINE) 50-325-40-30 MG capsule Take 1 capsule by mouth every 4 (four) hours as needed for headache. (Patient not taking: Reported on 01/07/2016) 30 capsule 0 Not Taking at Unknown time  . Multiple Vitamin (MULTIVITAMIN) tablet Take 1 tablet by mouth daily.   Not Taking at Unknown time  .  nitrofurantoin, macrocrystal-monohydrate, (MACROBID) 100 MG capsule Take 1 capsule (100 mg total) by mouth 2 (two) times daily. (Patient not taking: Reported on 01/07/2016) 14 capsule 1 Completed Course at Unknown time  . Prenat w/o A FeCbnFeGlu-FA &B6 (CITRANATAL B-CALM) 20-1 & 25 (2) MG MISC Take 20 mg by mouth daily. (Patient not taking: Reported on 01/07/2016) 30 each 11 Not Taking at Unknown time    Allergies  Allergen Reactions  . Penicillins Hives    Review of Systems: Negative except for what is mentioned in HPI.  Physical Exam: BP 130/75 (BP Location: Left Arm)   Pulse 85   Temp 98 F (36.7 C) (Oral)   Resp 16   Ht 5\' 8"  (1.727 m)   Wt 212 lb (96.2 kg)   LMP 04/29/2015 (Approximate)   BMI 32.23 kg/m  CONSTITUTIONAL: Well-developed, well-nourished female in no acute distress.  HENT:   Normocephalic, atraumatic, External right and left ear normal. Oropharynx is clear and moist EYES: Conjunctivae and EOM are normal. Pupils are equal, round, and reactive to light. No scleral icterus.  NECK: Normal range of motion, supple, no masses SKIN: Skin is warm and dry. No rash noted. Not diaphoretic. No erythema. No pallor. NEUROLOGIC: Alert and oriented to person, place, and time. Normal reflexes, muscle tone coordination. No cranial nerve deficit noted. PSYCHIATRIC: Normal mood and affect. Normal behavior. Normal judgment and thought content. CARDIOVASCULAR: Normal heart rate noted, regular rhythm RESPIRATORY: Effort and breath sounds normal, no problems with respiration noted ABDOMEN: Soft, nontender, nondistended, gravid. MUSCULOSKELETAL: Normal range of motion. No edema and no tenderness. 2+ distal pulses.  Cervical Exam: Dilatation 1 cm   Effacement 20-30%   Station ballotable   Presentation: cephalic FHT:  Baseline rate 140 bpm   Variability moderate  Accelerations present   Decelerations none Contractions: Irritability with occasional contraction   Pertinent Labs/Studies:   Results for orders placed or performed during the hospital encounter of 01/07/16 (from the past 24 hour(s))  CBC     Status: Abnormal   Collection Time: 01/07/16  2:35 PM  Result Value Ref Range   WBC 11.1 (H) 3.6 - 11.0 K/uL   RBC 4.52 3.80 - 5.20 MIL/uL   Hemoglobin 13.4 12.0 - 16.0 g/dL   HCT 16.1 09.6 - 04.5 %   MCV 84.1 80.0 - 100.0 fL   MCH 29.7 26.0 - 34.0 pg   MCHC 35.3 32.0 - 36.0 g/dL   RDW 40.9 81.1 - 91.4 %   Platelets 247 150 - 440 K/uL  Type and screen [facility]     Status: None   Collection Time: 01/07/16  2:35 PM  Result Value Ref Range   ABO/RH(D) A POS    Antibody Screen NEG    Sample Expiration 01/10/2016   Rapid HIV screen (HIV 1/2 Ab+Ag) ([facility] Only)     Status: None   Collection Time: 01/07/16  2:35 PM  Result Value Ref Range   HIV-1 P24 Antigen -  HIV24 NON REACTIVE NON REACTIVE   HIV 1/2 Antibodies NON REACTIVE NON REACTIVE   Interpretation (HIV Ag Ab)      A non reactive test result means that HIV 1 or HIV 2 antibodies and HIV 1 p24 antigen were not detected in the specimen.  Chlamydia/NGC rt PCR ([facility] only)     Status: None   Collection Time: 01/07/16  2:35 PM  Result Value Ref Range   Specimen source GC/Chlam ENDOCERVICAL    Chlamydia Tr NOT DETECTED NOT  DETECTED   N gonorrhoeae NOT DETECTED NOT DETECTED  Urinalysis complete, with microscopic ([facility] only)     Status: Abnormal   Collection Time: 01/07/16  2:35 PM  Result Value Ref Range   Color, Urine COLORLESS (A) YELLOW   APPearance CLEAR (A) CLEAR   Glucose, UA NEGATIVE NEGATIVE mg/dL   Bilirubin Urine NEGATIVE NEGATIVE   Ketones, ur NEGATIVE NEGATIVE mg/dL   Specific Gravity, Urine 1.001 (L) 1.005 - 1.030   Hgb urine dipstick 3+ (A) NEGATIVE   pH 7.0 5.0 - 8.0   Protein, ur NEGATIVE NEGATIVE mg/dL   Nitrite NEGATIVE NEGATIVE   Leukocytes, UA NEGATIVE NEGATIVE   RBC / HPF NONE SEEN 0 - 5 RBC/hpf   WBC, UA NONE SEEN 0 - 5 WBC/hpf   Bacteria, UA NONE SEEN NONE SEEN   Squamous Epithelial / LPF 0-5 (A) NONE SEEN     Imaging:  CLINICAL DATA:  Current assigned gestational age of [redacted] weeks 5 days. Premature rupture membranes.  EXAM: OBSTETRICAL ULTRASOUND >14 WKS  FINDINGS: Number of Fetuses: 1  Heart Rate:  141 bpm  Movement: Yes  Presentation: Cephalic  Previa: No  Placental Location: Anterior  Amniotic Fluid (Subjective): Decreased  Amniotic Fluid (Objective):  AFI 7.8 cm (5%ile= 8.1 cm, 95%= 24.8 cm for 34 wks)  FETAL BIOMETRY  BPD:  8.6cm 34w 4 d  HC:    31.6cm  35w   3d  AC:   30.4cm  34w   2d  FL:   7.3cm  37w   2d  Current Mean GA: 35w tod              US EDC: 02/09/2016  Estimated Fetal Weight:  2,640g    63%ile  FETAL ANATOMY  Lateral Ventricles: Appears normal  Thalami/CSP: Appears normal  Posterior  Fossa:  Appears normal  Nuchal Region: Not visualized  Upper Lip: Not visualized  Spine: Not well visualized  4 Chamber Heart on Left: Not visualized  LVOT: Not visualized  RVOT: Not visualized  Stomach on Left: Appears normal  3 Vessel Cord: Appears normal  Cord Insertion site: Not visualized  Kidneys: Appears normal  Bladder: Appears normal  Extremities: 4 extremities visualized  Sex: Not visualized  Technically difficult due to: Advanced gestational age, fetal position, and decreased amniotic fluid  Maternal Findings:  Cervix:  Not visualized ; >[redacted] weeks gestational age  IMPRESSION: Assigned gestational age is currently 34 weeks 5 days. Appropriate fetal growth, with EFW currently at 63 percentile.  Subjectively decreased amniotic fluid volume, with AFI measuring 7.8 cm.  Assessment : Jasmine FavaGina M Rogue is a 25 y.o. G3P1011 at 2729w5d being admitted for PPROM (unknown time, but earlier this morning is suspected).  Plan: Labor: Will actively manage labor as patient is over 34 weeks with PPROM according to ACOG Guidelines. Unsure of timing of PROM (as patient notes she has been feeling a gush since some time yesterday, although Nitrazine was negative x 2 in OB triage and patient had intercourse the day prior).   Started on course of antenatal steroids.  Will start on Pitocin.  GBS and GC/Cl ordered.  Ultrasound done for growth and AFI, shows oligohydramnios.  FWB: Reassuring fetal heart tracing.  GBS unknown.  Patient with allergy to PCN, will treat with Clindamycin.  Delivery plan: Hopeful for vaginal delivery

## 2016-01-07 NOTE — Consult Note (Signed)
Neonatology Consult to Antenatal Patient: 01/07/2016 3:41 PM    I was requested by Dr Valentino Saxonherry to see this patient in order to provide antenatal counseling due to PROM at 34 5/[redacted] weeks gestation.    [name] is a  25 y/o G3P1 who was admitted this afternoon and is now 2434 5/[redacted] weeks GA.  She has prenatal care and negative screens except unknown GBS status and varicella non-immune. She is currently having active labor with PROM today but unsure of the exact time.  Ms Jasmine Gray came in yesterday morning for question of PROM but Nitrazene test came back negative.  She came back this morning in active labor and per OB (Dr. Valentino Saxonherry) she is assuming that she ruptured sometime early this morning because Nitrazene test is now (+).  She will be getting a course of BMZ and started on IV antibiotics (Clindamycin secondary to PNCG allergy).   I spoke with [name] and FOB in LDR 3.   We discussed in detail what to expect in case of possible delivery of the infant in the next few days including morbidity and mortality at this gestational age, usual delivery room resuscitation.  Discussed possible respiratory complications and need for oxygen support, IV access, sepsis work-up, NG/OG feedings, hypoglycemia and length of stay.  They were attentive, had appropriate questions, and expressed appreciation for my input.     Thank you for asking us to see this patient and allowing us to participate in her care.  Please call if there are any further questions.   Overton MamMary Ann T Cova Knieriem, MD (Attending Neonatologist)  Total length of face-to-face or floor/unit time for this encounter was 30 minutes. Counseling and/or coordination of care was greater than fifty percent of the time.

## 2016-01-07 NOTE — OB Triage Note (Signed)
Patient to OBS2 from the ED with complaints of leaking fluid and bloody discharge.  Patient states that leaking continued after being seen in triage Saturday afternoon. Saturday evening patient noticed a pink tint to her discharge.  Today discharge has changed to a darker red color.  Patient was wearing a regular sized pad upon arrival that was partially saturated with pink drainage. Patient complains of intermittent cramping across her lower abdomen that she rates 5/10 on pain scale.  Last report of sexual intercourse was on Friday night.

## 2016-01-08 DIAGNOSIS — Z3A34 34 weeks gestation of pregnancy: Secondary | ICD-10-CM

## 2016-01-08 LAB — RPR: RPR: NONREACTIVE

## 2016-01-08 MED ORDER — MISOPROSTOL 25 MCG QUARTER TABLET
50.0000 ug | ORAL_TABLET | Freq: Four times a day (QID) | ORAL | Status: DC
  Administered 2016-01-08 – 2016-01-09 (×3): 50 ug via ORAL
  Filled 2016-01-08: qty 0.5
  Filled 2016-01-08 (×2): qty 1

## 2016-01-08 MED ORDER — ZOLPIDEM TARTRATE 5 MG PO TABS
ORAL_TABLET | ORAL | Status: AC
  Filled 2016-01-08: qty 1

## 2016-01-08 MED ORDER — BETAMETHASONE SOD PHOS & ACET 6 (3-3) MG/ML IJ SUSP
INTRAMUSCULAR | Status: AC
  Administered 2016-01-08: 12 mg via INTRAMUSCULAR
  Filled 2016-01-08: qty 1

## 2016-01-08 MED ORDER — ZOLPIDEM TARTRATE 5 MG PO TABS
5.0000 mg | ORAL_TABLET | Freq: Every evening | ORAL | Status: DC | PRN
  Administered 2016-01-08: 5 mg via ORAL

## 2016-01-08 NOTE — Progress Notes (Signed)
Jasmine Gray is a 25 y.o. G3P1011 at 4767w6d by LMP admitted for PROM  Subjective: Denies pain at this time, just hungry.  Objective: BP 119/65   Pulse 61   Temp 98.2 F (36.8 C) (Oral)   Resp 18   Ht 5\' 8"  (1.727 m)   Wt 212 lb (96.2 kg)   LMP 04/29/2015 (Approximate)   BMI 32.23 kg/m  I/O last 3 completed shifts: In: 2187.9 [I.V.:2037.9; IV Piggyback:150] Out: -  No intake/output data recorded.  FHT:  FHR: 135 bpm, variability: moderate,  accelerations:  Present,  decelerations:  Present occasional variable with fetal movement not lasting > 20 seconds. UC:   none SVE:   Dilation: 1 Effacement (%): 30 Station: Ballotable Exam by:: [employer]Millner, RN   Labs: Lab Results  Component Value Date   WBC 11.1 (H) 01/07/2016   HGB 13.4 01/07/2016   HCT 38.1 01/07/2016   MCV 84.1 01/07/2016   PLT 247 01/07/2016    Assessment / Plan: IOl switched to cytotec for the evening, and will restart pitocin in the am.   Labor: still trying to get into labor Preeclampsia:  labs stable Fetal Wellbeing:  Category I Pain Control:  Labor support without medications I/D:  n/a Anticipated MOD:  NSVD  [name], CNM 01/08/2016, 9:33 PM

## 2016-01-08 NOTE — Progress Notes (Signed)
Jasmine Gray is a 25 y.o. G3P1011 at 7955w6d by LMP admitted for PROM  Subjective:  States moderate pain with current contractions Objective: BP (!) 109/58 (BP Location: Left Arm)   Pulse 71   Temp 98 F (36.7 C) (Oral)   Resp 17   Ht 5\' 8"  (1.727 m)   Wt 212 lb (96.2 kg)   LMP 04/29/2015 (Approximate)   BMI 32.23 kg/m  I/O last 3 completed shifts: In: 2187.9 [I.V.:2037.9; IV Piggyback:150] Out: -  No intake/output data recorded.  FHT:  FHR: 135 bpm, variability: moderate,  accelerations:  Present,  decelerations:  Absent UC:   regular, every 5-6 minutes, mild to palpation on 5210mu/min pitocin, resting time between contractions >3 minutes with uterus soft to palpation SVE:   Dilation: 1 Effacement (%): 30 Station: Ballotable Exam by:: [employer], RN   Labs: Lab Results  Component Value Date   WBC 11.1 (H) 01/07/2016   HGB 13.4 01/07/2016   HCT 38.1 01/07/2016   MCV 84.1 01/07/2016   PLT 247 01/07/2016    Assessment / Plan: Induction of labor due to PROM,  progressing well on pitocin  Labor: Progressing normally Preeclampsia:  labs stable Fetal Wellbeing:  Category I Pain Control:  Labor support without medications I/D:  n/a Anticipated MOD:  NSVD  Jasmine Gray N Jasmine Gray 01/08/2016, 9:49 AM

## 2016-01-09 ENCOUNTER — Inpatient Hospital Stay: Payer: Medicaid Other | Admitting: Anesthesiology

## 2016-01-09 LAB — CBC WITH DIFFERENTIAL/PLATELET
BASOS PCT: 0 %
Basophils Absolute: 0 10*3/uL (ref 0–0.1)
EOS ABS: 0 10*3/uL (ref 0–0.7)
EOS PCT: 0 %
HCT: 35.5 % (ref 35.0–47.0)
HEMOGLOBIN: 12.4 g/dL (ref 12.0–16.0)
LYMPHS ABS: 1.7 10*3/uL (ref 1.0–3.6)
Lymphocytes Relative: 17 %
MCH: 29.3 pg (ref 26.0–34.0)
MCHC: 35 g/dL (ref 32.0–36.0)
MCV: 83.7 fL (ref 80.0–100.0)
Monocytes Absolute: 0.6 10*3/uL (ref 0.2–0.9)
Monocytes Relative: 6 %
NEUTROS PCT: 77 %
Neutro Abs: 8 10*3/uL — ABNORMAL HIGH (ref 1.4–6.5)
PLATELETS: 220 10*3/uL (ref 150–440)
RBC: 4.25 MIL/uL (ref 3.80–5.20)
RDW: 13.9 % (ref 11.5–14.5)
WBC: 10.4 10*3/uL (ref 3.6–11.0)

## 2016-01-09 MED ORDER — LIDOCAINE-EPINEPHRINE (PF) 1.5 %-1:200000 IJ SOLN
INTRAMUSCULAR | Status: DC | PRN
  Administered 2016-01-09: 4 mL via EPIDURAL

## 2016-01-09 MED ORDER — BUPIVACAINE HCL (PF) 0.25 % IJ SOLN
INTRAMUSCULAR | Status: DC | PRN
  Administered 2016-01-09: 5 mL via EPIDURAL

## 2016-01-09 MED ORDER — FENTANYL 2.5 MCG/ML W/ROPIVACAINE 0.2% IN NS 100 ML EPIDURAL INFUSION (ARMC-ANES)
EPIDURAL | Status: DC | PRN
  Administered 2016-01-09: 10 mL/h via EPIDURAL

## 2016-01-09 MED ORDER — FENTANYL 2.5 MCG/ML W/ROPIVACAINE 0.2% IN NS 100 ML EPIDURAL INFUSION (ARMC-ANES)
EPIDURAL | Status: AC
  Filled 2016-01-09: qty 100

## 2016-01-09 MED ORDER — OXYTOCIN 40 UNITS IN LACTATED RINGERS INFUSION - SIMPLE MED
1.0000 m[IU]/min | INTRAVENOUS | Status: DC
  Administered 2016-01-09: 1 m[IU]/min via INTRAVENOUS
  Filled 2016-01-09: qty 1000

## 2016-01-09 NOTE — Progress Notes (Signed)
Jasmine Gray is a 25 y.o. G3P1011 at 7034w0d by LMP admitted for PROM  Subjective: Rates contractions a 7 on pain scale 0-10  Objective: BP 118/64   Pulse 64   Temp 98 F (36.7 C) (Oral)   Resp 18   Ht 5\' 8"  (1.727 m)   Wt 212 lb (96.2 kg)   LMP 04/29/2015 (Approximate)   BMI 32.23 kg/m  I/O last 3 completed shifts: In: 2187.9 [I.V.:2037.9; IV Piggyback:150] Out: -  No intake/output data recorded., vertex confirmed on ultrasound at bedside  FHT:  FHR: 130 bpm, variability: moderate,  accelerations:  Present,  decelerations:  Absent UC:   irregular, every 3-4 minutes, moderate to palpation, with resting time >2 minutes and uterus soft, on 3 mu/min pitocin SVE:   1.5/70/-3  Labs: Lab Results  Component Value Date   WBC 11.1 (H) 01/07/2016   HGB 13.4 01/07/2016   HCT 38.1 01/07/2016   MCV 84.1 01/07/2016   PLT 247 01/07/2016    Assessment / Plan: Induction of labor due to PROM,  progressing well on pitocin  Labor: IOL with slow but appropriate response to pitocin Preeclampsia:  labs stable Fetal Wellbeing:  Category I Pain Control:  Labor support without medications I/D:  n/a Anticipated MOD:  NSVD  Jasmine Gray, CNM 01/09/2016, 10:37 AM

## 2016-01-09 NOTE — Anesthesia Procedure Notes (Signed)
Epidural Patient location during procedure: OB  Staffing Performed: anesthesiologist   Preanesthetic Checklist Completed: patient identified, site marked, surgical consent, pre-op evaluation, timeout performed, IV checked, risks and benefits discussed and monitors and equipment checked  Epidural Patient position: sitting Prep: Betadine Patient monitoring: heart rate, continuous pulse ox and blood pressure Approach: midline Location: L3-L4 Injection technique: LOR air  Needle:  Needle type: Tuohy  Needle gauge: 18 G Needle length: 9 cm and 9 Needle insertion depth: 6 cm Catheter type: closed end flexible Catheter size: 20 Guage Catheter at skin depth: 12 cm Test dose: negative and 1.5% lidocaine with Epi 1:200 K  Assessment Sensory level: T10 Events: blood not aspirated, injection not painful, no injection resistance, negative IV test and no paresthesia  Additional Notes Pt's history reviewed and consent obtained as per OB consent Patient tolerated the insertion well without complications. Negative SATD, negative IVTD All VSS were obtained and monitored through OBIX and nursing protocols followed.Reason for block:procedure for pain

## 2016-01-09 NOTE — Anesthesia Preprocedure Evaluation (Signed)
Anesthesia Evaluation  Patient identified by MRN, date of birth, ID band Patient awake    Reviewed: Allergy & Precautions, H&P , NPO status , Patient's Chart, lab work & pertinent test results, reviewed documented beta blocker date and time   Airway Mallampati: II  TM Distance: >3 FB Neck ROM: full    Dental no notable dental hx. (+) Teeth Intact   Pulmonary neg pulmonary ROS, Current Smoker,    Pulmonary exam normal breath sounds clear to auscultation       Cardiovascular Exercise Tolerance: Good negative cardio ROS   Rhythm:regular Rate:Normal     Neuro/Psych negative neurological ROS  negative psych ROS   GI/Hepatic negative GI ROS, Neg liver ROS, GERD  Medicated,  Endo/Other  negative endocrine ROSdiabetes  Renal/GU      Musculoskeletal   Abdominal   Peds  Hematology negative hematology ROS (+)   Anesthesia Other Findings   Reproductive/Obstetrics (+) Pregnancy                             Anesthesia Physical Anesthesia Plan  ASA: II  Anesthesia Plan: Epidural   Post-op Pain Management:    Induction:   Airway Management Planned:   Additional Equipment:   Intra-op Plan:   Post-operative Plan:   Informed Consent: I have reviewed the patients History and Physical, chart, labs and discussed the procedure including the risks, benefits and alternatives for the proposed anesthesia with the patient or authorized representative who has indicated his/her understanding and acceptance.     Plan Discussed with:   Anesthesia Plan Comments:         Anesthesia Quick Evaluation

## 2016-01-09 NOTE — Progress Notes (Signed)
Jasmine Gray is a 25 y.o. G3P1011 at 6758w0d by LMP admitted for PROM  Subjective: Continues to rate pain a 7 on pain scale, states IV stadol helps.  Objective: BP 128/78   Pulse 78   Temp 98 F (36.7 C) (Oral)   Resp 18   Ht 5\' 8"  (1.727 m)   Wt 212 lb (96.2 kg)   LMP 04/29/2015 (Approximate)   BMI 32.23 kg/m  I/O last 3 completed shifts: In: 2187.9 [I.V.:2037.9; IV Piggyback:150] Out: -  Total I/O In: 1411.7 [P.O.:120; I.V.:1291.7] Out: -   FHT:  FHR: 135 bpm, variability: moderate,  accelerations:  Present,  decelerations:  Absent UC:   regular, every 2-4 minutes, 180-240 MVU on 8 mu/min pitocin SVE:   Dilation: 3 Effacement (%): 80 Station: -2 Exam by:: MNS,CNM  Labs: Lab Results  Component Value Date   WBC 10.4 01/09/2016   HGB 12.4 01/09/2016   HCT 35.5 01/09/2016   MCV 83.7 01/09/2016   PLT 220 01/09/2016    Assessment / Plan: Augmentation of labor, progressing well  Labor: Progressing normally Preeclampsia:  labs stable Fetal Wellbeing:  Category I Pain Control:  Labor support without medications I/D:  n/a Anticipated MOD:  NSVD  Jasmine Gray, CNM 01/09/2016, 6:21 PM

## 2016-01-09 NOTE — Progress Notes (Signed)
Michell HeinrichKathy Hobday is given a note for excuse from work, from 01/08/16, through 01/10/16. She has been labor support for her daughter, Romie LeveeGina Wolfert, at [facility]. I attest she has been here the entire time.  Grettel Rames AkiachakShambley, CNM [facility] [place] [phone]

## 2016-01-10 ENCOUNTER — Encounter: Payer: Self-pay | Admitting: *Deleted

## 2016-01-10 DIAGNOSIS — Z3A34 34 weeks gestation of pregnancy: Secondary | ICD-10-CM

## 2016-01-10 DIAGNOSIS — O42013 Preterm premature rupture of membranes, onset of labor within 24 hours of rupture, third trimester: Secondary | ICD-10-CM

## 2016-01-10 LAB — CULTURE, BETA STREP (GROUP B ONLY)

## 2016-01-10 LAB — CBC
HCT: 32.3 % — ABNORMAL LOW (ref 35.0–47.0)
Hemoglobin: 11.3 g/dL — ABNORMAL LOW (ref 12.0–16.0)
MCH: 29.3 pg (ref 26.0–34.0)
MCHC: 34.8 g/dL (ref 32.0–36.0)
MCV: 84 fL (ref 80.0–100.0)
PLATELETS: 196 10*3/uL (ref 150–440)
RBC: 3.85 MIL/uL (ref 3.80–5.20)
RDW: 13.8 % (ref 11.5–14.5)
WBC: 19.2 10*3/uL — AB (ref 3.6–11.0)

## 2016-01-10 MED ORDER — SENNOSIDES-DOCUSATE SODIUM 8.6-50 MG PO TABS
2.0000 | ORAL_TABLET | ORAL | Status: DC
  Administered 2016-01-12: 2 via ORAL
  Filled 2016-01-10: qty 2

## 2016-01-10 MED ORDER — WITCH HAZEL-GLYCERIN EX PADS: 1.0000 "application " | MEDICATED_PAD | CUTANEOUS | Status: DC | PRN

## 2016-01-10 MED ORDER — FENTANYL 2.5 MCG/ML W/ROPIVACAINE 0.2% IN NS 100 ML EPIDURAL INFUSION (ARMC-ANES): 10.0000 mL/h | EPIDURAL | Status: AC

## 2016-01-10 MED ORDER — PHENYLEPHRINE 40 MCG/ML (10ML) SYRINGE FOR IV PUSH (FOR BLOOD PRESSURE SUPPORT): 80.0000 ug | PREFILLED_SYRINGE | INTRAVENOUS | Status: DC | PRN

## 2016-01-10 MED ORDER — FERROUS SULFATE 325 (65 FE) MG PO TABS
325.0000 mg | ORAL_TABLET | Freq: Two times a day (BID) | ORAL | Status: DC
  Administered 2016-01-10 – 2016-01-12 (×5): 325 mg via ORAL
  Filled 2016-01-10 (×5): qty 1

## 2016-01-10 MED ORDER — ONDANSETRON HCL 4 MG PO TABS: 4.0000 mg | ORAL_TABLET | ORAL | Status: DC | PRN

## 2016-01-10 MED ORDER — EPHEDRINE 5 MG/ML INJ: 10.0000 mg | INTRAVENOUS | Status: DC | PRN

## 2016-01-10 MED ORDER — DIPHENHYDRAMINE HCL 25 MG PO CAPS: 25.0000 mg | ORAL_CAPSULE | Freq: Four times a day (QID) | ORAL | Status: DC | PRN

## 2016-01-10 MED ORDER — PRENATAL MULTIVITAMIN CH
1.0000 | ORAL_TABLET | Freq: Every day | ORAL | Status: DC
  Administered 2016-01-10 – 2016-01-12 (×3): 1 via ORAL
  Filled 2016-01-10 (×3): qty 1

## 2016-01-10 MED ORDER — SIMETHICONE 80 MG PO CHEW: 80.0000 mg | CHEWABLE_TABLET | ORAL | Status: DC | PRN

## 2016-01-10 MED ORDER — COCONUT OIL OIL: 1.0000 "application " | TOPICAL_OIL | Status: DC | PRN

## 2016-01-10 MED ORDER — DIBUCAINE 1 % RE OINT
1.0000 | TOPICAL_OINTMENT | RECTAL | Status: DC | PRN
Start: 2016-01-10 — End: 2016-01-12

## 2016-01-10 MED ORDER — IBUPROFEN 600 MG PO TABS
600.0000 mg | ORAL_TABLET | Freq: Four times a day (QID) | ORAL | Status: DC
  Administered 2016-01-10 – 2016-01-11 (×3): 600 mg via ORAL
  Filled 2016-01-10 (×4): qty 1

## 2016-01-10 MED ORDER — DIPHENHYDRAMINE HCL 50 MG/ML IJ SOLN: 12.5000 mg | INTRAMUSCULAR | Status: DC | PRN

## 2016-01-10 MED ORDER — BENZOCAINE-MENTHOL 20-0.5 % EX AERO: 1.0000 "application " | INHALATION_SPRAY | CUTANEOUS | Status: DC | PRN

## 2016-01-10 MED ORDER — VARICELLA VIRUS VACCINE LIVE 1350 PFU/0.5ML IJ SUSR: 0.5000 mL | Freq: Once | INTRAMUSCULAR | Status: DC

## 2016-01-10 MED ORDER — ZOLPIDEM TARTRATE 5 MG PO TABS: 5.0000 mg | ORAL_TABLET | Freq: Every evening | ORAL | Status: DC | PRN

## 2016-01-10 MED ORDER — LACTATED RINGERS IV SOLN: 500.0000 mL | Freq: Once | INTRAVENOUS | Status: DC

## 2016-01-10 MED ORDER — ONDANSETRON HCL 4 MG/2ML IJ SOLN: 4.0000 mg | INTRAMUSCULAR | Status: DC | PRN

## 2016-01-10 MED ORDER — ACETAMINOPHEN 325 MG PO TABS: 650.0000 mg | ORAL_TABLET | ORAL | Status: DC | PRN

## 2016-01-10 NOTE — Progress Notes (Signed)
Angie FavaGina M Liming is a 25 y.o. G3P1011 at 299w1d by LMP admitted for PROM  Subjective:  Denies pain at this time, epidural in place Objective: BP 116/60 (BP Location: Left Arm)   Pulse (!) 56   Temp 98.5 F (36.9 C) (Oral)   Resp 18   Ht 5\' 8"  (1.727 m)   Wt 212 lb (96.2 kg)   LMP 04/29/2015 (Approximate)   BMI 32.23 kg/m  I/O last 3 completed shifts: In: 2009.8 [P.O.:120; I.V.:1839.8; IV Piggyback:50] Out: -  Total I/O In: 668.8 [P.O.:120; I.V.:548.8] Out: -   FHT:  FHR: 135 bpm, variability: moderate,  accelerations:  Present,  decelerations:  Present early variables down to 90s with each contraction for the last 30 minutes  UC:   regular, every 2 minutes, moderate to palpation with 11 mu/min pitocin, resting baseline at 15 between contractions SVE:   6/80/-2, mild molding of fetal head noted, with moderate bloody show Labs: Lab Results  Component Value Date   WBC 10.4 01/09/2016   HGB 12.4 01/09/2016   HCT 35.5 01/09/2016   MCV 83.7 01/09/2016   PLT 220 01/09/2016    Assessment / Plan: Augmentation of labor, progressing well, pitocin stopped and IVF bolus administered, O2 placed, and amnioinfusion started. If variable decels resolve will restart pitocin and still hopeful for SVD  Labor: progressing slowy on pitocin Preeclampsia:  labs stable Fetal Wellbeing:  Category II Pain Control:  Epidural I/D:  n/a Anticipated MOD:  NSVD  Jassica Zazueta N Jazzmyn Filion 01/10/2016, 1:23 AM

## 2016-01-11 ENCOUNTER — Encounter
Admission: EM | Disposition: A | Discharge status: Home / Self Care | Payer: Self-pay | Source: Home / Self Care | Attending: Obstetrics and Gynecology

## 2016-01-11 SURGERY — LIGATION, FALLOPIAN TUBE, POSTPARTUM
Anesthesia: Choice

## 2016-01-11 MED ORDER — IBUPROFEN 600 MG PO TABS
600.0000 mg | ORAL_TABLET | Freq: Four times a day (QID) | ORAL | Status: DC
  Administered 2016-01-12 (×3): 600 mg via ORAL
  Filled 2016-01-11 (×4): qty 1

## 2016-01-11 MED ORDER — IBUPROFEN 600 MG PO TABS
600.0000 mg | ORAL_TABLET | Freq: Four times a day (QID) | ORAL | Status: DC
  Administered 2016-01-11 (×3): 600 mg via ORAL
  Filled 2016-01-11 (×3): qty 1

## 2016-01-11 NOTE — Anesthesia Postprocedure Evaluation (Signed)
Anesthesia Post Note  Patient: Angie FavaGina M Buehler  Procedure(s) Performed: * No procedures listed *  Patient location during evaluation: Mother Baby Anesthesia Type: Epidural Level of consciousness: awake, awake and alert and oriented Pain management: pain level controlled Vital Signs Assessment: post-procedure vital signs reviewed and stable Respiratory status: spontaneous breathing Cardiovascular status: blood pressure returned to baseline and stable Postop Assessment: no headache, no backache, adequate PO intake and no signs of nausea or vomiting Anesthetic complications: no    Last Vitals:  Vitals:   01/10/16 2305 01/11/16 0331  BP: (!) 112/53 (!) 107/50  Pulse: 80 (!) 57  Resp: 14 16  Temp: 37.1 C 36.8 C    Last Pain:  Vitals:   01/11/16 0331  TempSrc: Oral  PainSc:                  Karoline Caldwelleana Linzy Laury

## 2016-01-11 NOTE — Progress Notes (Signed)
Post Partum Day 1 Subjective: no complaints, up ad lib and voiding  Objective: Blood pressure 110/74, pulse (!) 58, temperature 98.3 F (36.8 C), temperature source Oral, resp. rate 16, height 5\' 8"  (1.727 m), weight 212 lb (96.2 kg), last menstrual period 04/29/2015, SpO2 100 %, unknown if currently breastfeeding.  Physical Exam:  General: alert, cooperative and appears stated age Lochia: appropriate Uterine Fundus: firm Incision: NA DVT Evaluation: No evidence of DVT seen on physical exam. Negative Homan's sign.   Recent Labs  01/09/16 1048 01/10/16 0715  HGB 12.4 11.3*  HCT 35.5 32.3*    Assessment/Plan: Plan for discharge tomorrow and Contraception desires BTL in 3-4 weeks outpatient Infant feeding  Bottle;    LOS: 4 days   Melody N Shambley 01/11/2016, 8:38 AM

## 2016-01-12 ENCOUNTER — Encounter: Payer: Medicaid Other | Admitting: Obstetrics and Gynecology

## 2016-01-12 NOTE — Discharge Summary (Signed)
OB Discharge Summary     Patient Name: Jasmine Gray DOB: 08/05/1990 MRN: [id number]  Date of admission: 01/07/2016 Delivering MD: Purcell NailsSHAMBLEY, Ellyse Rotolo N   Date of discharge: 01/12/2016  Admitting diagnosis: 34 weeks bleeding desire for permanent sterility Intrauterine pregnancy: 4486w1d     Secondary diagnosis:  Active Problems:   Preterm premature rupture of membranes (PPROM) with onset of labor within 24 hours of rupture in first trimester, antepartum   Preterm premature rupture of membranes (PPROM) with unknown onset of labor  Additional problems: none     Discharge diagnosis: Preterm Pregnancy Delivered                                                                                                Post partum procedures:varicella vaccine  Augmentation: Pitocin and Cytotec  Complications: ROM>24 hours  Hospital course:  Onset of Labor With Vaginal Delivery     25 y.o. yo [id number] at 7986w1d was admitted in Latent Labor on 01/07/2016. Patient had an uncomplicated labor course as follows:  Membrane Rupture Time/Date: 9:30 AM ,01/06/2016   Intrapartum Procedures: Episiotomy: None [1]                                         Lacerations:     Patient had a delivery of a Viable infant. 01/10/2016  Information for the patient's newborn:  Sherren KernsReed, Boy Jenean [[id number]][[id number]]  Delivery Method: Vag-Spont    Pateint had an uncomplicated postpartum course.  She is ambulating, tolerating a regular diet, passing flatus, and urinating well. Patient is discharged home in stable condition on 01/12/16.    Physical exam Vitals:   01/11/16 0813 01/11/16 1116 01/11/16 1636 01/11/16 1948  BP: 110/74   131/73  Pulse: (!) 58   61  Resp: 16   18  Temp: 98.3 F (36.8 C) 97.9 F (36.6 C) 98.5 F (36.9 C) 98.3 F (36.8 C)  TempSrc: Oral  Oral Oral  SpO2: 100%   100%  Weight:      Height:       General: alert, cooperative and no distress Lochia: appropriate Uterine Fundus: firm Incision: N/A DVT  Evaluation: No evidence of DVT seen on physical exam. Negative Homan's sign. Labs: Lab Results  Component Value Date   WBC 19.2 (H) 01/10/2016   HGB 11.3 (L) 01/10/2016   HCT 32.3 (L) 01/10/2016   MCV 84.0 01/10/2016   PLT 196 01/10/2016   CMP Latest Ref Rng & Units 12/28/2012  Glucose 65 - 99 mg/dL 85  BUN 7 - 18 mg/dL 4(L)  Creatinine 6.570.60 - 1.30 mg/dL 8.460.67  Sodium 962136 - 952145 mmol/L 138  Potassium 3.5 - 5.1 mmol/L 2.8(L)  Chloride 98 - 107 mmol/L 107  CO2 21 - 32 mmol/L 26  Calcium 8.5 - 10.1 mg/dL 8.8  Total Protein 6.4 - 8.2 g/dL 7.5  Total Bilirubin 0.2 - 1.0 mg/dL 0.2  Alkaline Phos Unit/L 70  AST 15 - 37 Unit/L 26  ALT 12 - 78 U/L  30    Discharge instruction: per After Visit Summary and "Baby and Me Booklet".  After visit meds:    Medication List    STOP taking these medications   butalbital-acetaminophen-caffeine 50-325-40-30 MG capsule Commonly known as:  FIORICET WITH CODEINE   nitrofurantoin (macrocrystal-monohydrate) 100 MG capsule Commonly known as:  MACROBID     TAKE these medications   CITRANATAL B-CALM 20-1 & 25 (2) MG Misc Take 20 mg by mouth daily.   multivitamin tablet Take 1 tablet by mouth daily.       Diet: routine diet  Activity: Advance as tolerated. Pelvic rest for 6 weeks.   Outpatient follow up:6 weeks Follow up Appt:No future appointments. Follow up Visit:No Follow-up on file.  Postpartum contraception: Tubal Ligation  Newborn Data: Live born female  Birth Weight: 4 lb 13.6 oz (2200 g) APGAR: 8, 9  Baby Feeding: Bottle Disposition:home with mother   01/12/2016 Purcell NailsMelody N Kynzie Polgar, CNM

## 2016-01-12 NOTE — Progress Notes (Signed)
Discharge instructions reviewed with patient.  All questions answered. Pt discharged will stay overnight with infant in rm 331 due to infant being under lights.

## 2016-01-13 ENCOUNTER — Encounter: Payer: Self-pay | Admitting: Obstetrics and Gynecology

## 2016-01-17 ENCOUNTER — Other Ambulatory Visit: Payer: Self-pay | Admitting: *Deleted

## 2016-01-17 MED ORDER — OSELTAMIVIR PHOSPHATE 75 MG PO CAPS: 75.0000 mg | ORAL_CAPSULE | Freq: Every day | ORAL | 0 refills | Status: DC

## 2016-01-19 ENCOUNTER — Encounter: Payer: Medicaid Other | Admitting: Obstetrics and Gynecology

## 2016-01-26 ENCOUNTER — Encounter: Payer: Medicaid Other | Admitting: Obstetrics and Gynecology

## 2016-02-02 ENCOUNTER — Encounter: Payer: Medicaid Other | Admitting: Obstetrics and Gynecology

## 2016-02-09 ENCOUNTER — Encounter: Payer: Medicaid Other | Admitting: Obstetrics and Gynecology

## 2016-02-15 ENCOUNTER — Encounter: Payer: Medicaid Other | Admitting: Obstetrics and Gynecology

## 2016-02-22 ENCOUNTER — Encounter: Payer: Medicaid Other | Admitting: Obstetrics and Gynecology

## 2016-02-29 ENCOUNTER — Encounter: Payer: Medicaid Other | Admitting: Obstetrics and Gynecology

## 2016-03-06 ENCOUNTER — Encounter: Payer: Self-pay | Admitting: Obstetrics and Gynecology

## 2016-03-06 ENCOUNTER — Ambulatory Visit (INDEPENDENT_AMBULATORY_CARE_PROVIDER_SITE_OTHER): Payer: Medicaid Other | Admitting: Obstetrics and Gynecology

## 2016-03-06 DIAGNOSIS — Z30017 Encounter for initial prescription of implantable subdermal contraceptive: Secondary | ICD-10-CM

## 2016-03-06 MED ORDER — ETONOGESTREL 68 MG ~~LOC~~ IMPL: 68.0000 mg | DRUG_IMPLANT | Freq: Once | SUBCUTANEOUS | Status: DC

## 2016-03-06 NOTE — Patient Instructions (Signed)
  Place postpartum visit patient instructions here.  

## 2016-03-06 NOTE — Progress Notes (Signed)
  Subjective:     Angie FavaGina M Jarrett is a 26 y.o. female who presents for a postpartum visit. She is 6 weeks postpartum following a spontaneous vaginal delivery. I have fully reviewed the prenatal and intrapartum course. The delivery was at 36 gestational weeks. Outcome: spontaneous vaginal delivery. Anesthesia: epidural. Postpartum course has been uncomplicated. Baby's course has been uncomplicated. Baby is feeding by formula. Bleeding moderate lochia. Bowel function is normal. Bladder function is normal. Patient is not sexually active. Contraception method is abstinence. Postpartum depression screening: negative.  The following portions of the patient's history were reviewed and updated as appropriate: allergies, current medications, past family history, past medical history, past social history, past surgical history and problem list.  Review of Systems A comprehensive review of systems was negative.   Objective:    BP 138/74   Pulse 88   Ht 5\' 8"  (1.727 m)   Wt 202 lb 9.6 oz (91.9 kg)   LMP 03/06/2016   Breastfeeding? No   BMI 30.81 kg/m   General:  alert, cooperative, appears stated age and mildly obese   Breasts:  inspection negative, no nipple discharge or bleeding, no masses or nodularity palpable  Lungs: clear to auscultation bilaterally  Heart:  regular rate and rhythm, S1, S2 normal, no murmur, click, rub or gallop  Abdomen: soft, non-tender; bowel sounds normal; no masses,  no organomegaly   Vulva:  normal  Vagina: normal vagina, no discharge, exudate, lesion, or erythema  Cervix:  multiparous appearance  Corpus: normal size, contour, position, consistency, mobility, non-tender  Adnexa:  no mass, fullness, tenderness  Rectal Exam: Not performed.         Angie FavaGina M Schuhmacher is a 26 y.o. year old Caucasian female here for Nexplanon insertion.  Patient's last menstrual period was 03/06/2016., last sexual intercourse was before delivery, and her pregnancy test today was negative.   Risks/benefits/side effects of Nexplanon have been discussed and her questions have been answered.  Specifically, a failure rate of 02/998 has been reported, with an increased failure rate if pt takes St. John's Wort and/or antiseizure medicaitons.  Angie FavaGina M Canale is aware of the common side effect of irregular bleeding, which the incidence of decreases over time.  BP 138/74   Pulse 88   Ht 5\' 8"  (1.727 m)   Wt 202 lb 9.6 oz (91.9 kg)   LMP 03/06/2016   Breastfeeding? No   BMI 30.81 kg/m   No results found for this or any previous visit (from the past 24 hour(s)).   She is right-handed, so her left arm, approximately 4 inches proximal from the elbow, was cleansed with alcohol and anesthetized with 2cc of 2% Lidocaine.  The area was cleansed again with betadine and the Nexplanon was inserted per manufacturer's recommendations without difficulty.  A steri-strip and pressure bandage were applied.  Pt was instructed to keep the area clean and dry, remove pressure bandage in 24 hours, and keep insertion site covered with the steri-strip for 3-5 days.  Back up contraception was recommended for 2 weeks.  She was given a card indicating date Nexplanon was inserted and date it needs to be removed. Follow-up PRN problems.  Sacheen Arrasmith,CNM   Assessment:     6 weeks postpartum exam. Pap smear not done at today's visit.  Nexplanon insertion Plan:    1. Contraception: Nexplanon inserted  3. Follow up in: 4 months or as needed.

## 2016-04-08 ENCOUNTER — Encounter: Payer: Self-pay | Admitting: Obstetrics and Gynecology

## 2016-04-18 ENCOUNTER — Encounter: Payer: Self-pay | Admitting: Obstetrics and Gynecology

## 2016-04-25 ENCOUNTER — Encounter: Payer: Medicaid Other | Admitting: Obstetrics and Gynecology

## 2016-04-29 DIAGNOSIS — Z8751 Personal history of pre-term labor: Secondary | ICD-10-CM | POA: Insufficient documentation

## 2016-05-08 ENCOUNTER — Encounter: Payer: Medicaid Other | Admitting: Obstetrics and Gynecology

## 2016-09-16 ENCOUNTER — Emergency Department
Admission: EM | Admit: 2016-09-16 | Discharge: 2016-09-16 | Disposition: A | Discharge status: Home / Self Care | Payer: Medicaid Other | Attending: Emergency Medicine | Admitting: Emergency Medicine

## 2016-09-16 DIAGNOSIS — R42 Dizziness and giddiness: Secondary | ICD-10-CM

## 2016-09-16 DIAGNOSIS — F17210 Nicotine dependence, cigarettes, uncomplicated: Secondary | ICD-10-CM | POA: Insufficient documentation

## 2016-09-16 LAB — BASIC METABOLIC PANEL
Anion gap: 8 (ref 5–15)
BUN: 10 mg/dL (ref 6–20)
CO2: 24 mmol/L (ref 22–32)
Calcium: 9.3 mg/dL (ref 8.9–10.3)
Chloride: 104 mmol/L (ref 101–111)
Creatinine, Ser: 0.83 mg/dL (ref 0.44–1.00)
GFR calc Af Amer: >60 mL/min (ref >60)
GFR calc non Af Amer: >60 mL/min (ref >60)
Glucose, Bld: 99 mg/dL (ref 65–99)
Potassium: 3.5 mmol/L (ref 3.5–5.1)
Sodium: 136 mmol/L (ref 135–145)

## 2016-09-16 LAB — URINALYSIS, COMPLETE (UACMP) WITH MICROSCOPIC
BACTERIA UA: NONE SEEN
Bilirubin Urine: NEGATIVE
Glucose, UA: NEGATIVE mg/dL
HGB URINE DIPSTICK: NEGATIVE
Ketones, ur: NEGATIVE mg/dL
Nitrite: NEGATIVE
PROTEIN: NEGATIVE mg/dL
SPECIFIC GRAVITY, URINE: 1.004 — AB (ref 1.005–1.030)
pH: 6 (ref 5.0–8.0)

## 2016-09-16 LAB — CBC
HEMATOCRIT: 44.9 % (ref 35.0–47.0)
HEMOGLOBIN: 15.2 g/dL (ref 12.0–16.0)
MCH: 27.4 pg (ref 26.0–34.0)
MCHC: 33.9 g/dL (ref 32.0–36.0)
MCV: 81 fL (ref 80.0–100.0)
Platelets: 240 10*3/uL (ref 150–440)
RBC: 5.54 MIL/uL — AB (ref 3.80–5.20)
RDW: 14.1 % (ref 11.5–14.5)
WBC: 10.7 10*3/uL (ref 3.6–11.0)

## 2016-09-16 LAB — TROPONIN I

## 2016-09-16 LAB — POCT PREGNANCY, URINE: PREG TEST UR: NEGATIVE

## 2016-09-16 MED ORDER — MECLIZINE HCL 25 MG PO TABS: 25.0000 mg | ORAL_TABLET | Freq: Three times a day (TID) | ORAL | 1 refills | Status: DC | PRN

## 2016-09-16 MED ORDER — PREDNISONE 20 MG PO TABS: 20.0000 mg | ORAL_TABLET | Freq: Every day | ORAL | 0 refills | Status: DC

## 2016-09-16 NOTE — ED Notes (Addendum)
Pt uprite on stretcher in exam room with no distress noted; reports bilat earache x 4 days accomp by congestion and dizziness and frontal HA; pain 5/10 at present, st intermittent; right ear has been draining clear/brown fluid; resp even/unlab, lungs clear, apical audible & regular, +BS, abd soft/nondist, +PP -edema

## 2016-09-16 NOTE — ED Provider Notes (Signed)
[facility] Emergency Department Provider Note  ____________________________________________  Time seen: Approximately 11:16 PM  I have reviewed the triage vital signs and the nursing notes.   HISTORY  Chief Complaint Otalgia and Dizziness    HPI Jasmine Gray is a 26 y.o. female who complains of bilateral ear ache for the past 4 days associated with intermittent vertiginous symptoms of room spinning. Denies headaches syncope or head trauma. No fevers chills neck pain or neck stiffness. No vomiting. She is eating and drinking normally. She reports having problems with vertigo over the past year but it seems to be more frequent. Also recently had a cold that she caught from her kids over the last couple weeks. Also reports chronic vague chest pain shortness of breath, not exertional or pleuritic.     Past Medical History:  Diagnosis Date  . Acid reflux    on no RX at present  . Hemorrhoid 11/2015   starting on past Sat. Pt states it is on the outside  . Tobacco abuse      Patient Active Problem List   Diagnosis Date Noted  . Overweight (BMI 25.0-29.9) 07/28/2015  . Tobacco user 05/31/2014     Past Surgical History:  Procedure Laterality Date  . NO PAST SURGERIES       Prior to Admission medications   Medication Sig Start Date End Date Taking? Authorizing Provider  meclizine (ANTIVERT) 25 MG tablet Take 1 tablet (25 mg total) by mouth 3 (three) times daily as needed for dizziness or nausea. 09/16/16   Sharman CheekStafford, Iverson Sees, MD  predniSONE (DELTASONE) 20 MG tablet Take 1 tablet (20 mg total) by mouth daily. 09/16/16   Sharman CheekStafford, Anubis Fundora, MD     Allergies Penicillins   Family History  Problem Relation Age of Onset  . Heart disease Father   . Cancer Neg Hx   . Depression Neg Hx     Social History Social History  Substance Use Topics  . Smoking status: Current Every Day Smoker    Packs/day: 0.25    Years: 5.00    Types: Cigarettes  .  Smokeless tobacco: Never Used  . Alcohol use No    Review of Systems  Constitutional:   No fever or chills.  ENT:   No sore throat. No rhinorrhea. Cardiovascular:   Chronic Occasional chest pain without syncope syncope. Respiratory:   Chronic occasional shortness of breath without cough. Gastrointestinal:   Negative for abdominal pain, vomiting and diarrhea.  Musculoskeletal:   Negative for focal pain or swelling All other systems reviewed and are negative except as documented above in ROS and HPI.  ____________________________________________   PHYSICAL EXAM:  VITAL SIGNS: ED Triage Vitals  Enc Vitals Group     BP 09/16/16 1959 (!) 146/81     Pulse Rate 09/16/16 1959 (!) 135     Resp 09/16/16 1959 18     Temp 09/16/16 1959 98.5 F (36.9 C)     Temp Source 09/16/16 1959 Oral     SpO2 09/16/16 1959 100 %     Weight 09/16/16 1956 205 lb (93 kg)     Height 09/16/16 1956 5\' 8"  (1.727 m)     Head Circumference --      Peak Flow --      Pain Score 09/16/16 1955 5     Pain Loc --      Pain Edu? --      Excl. in GC? --     Vital signs reviewed,  nursing assessments reviewed.   Constitutional:   Alert and oriented. Well appearing and in no distress. Eyes:   No scleral icterus.  EOMI. No nystagmus. No conjunctival pallor. PERRL. ENT   Head:   Normocephalic and atraumatic.TMs normal bilaterally, external canals normal   Nose:   No congestion/rhinnorhea.    Mouth/Throat:   MMM, no pharyngeal erythema. No peritonsillar mass.    Neck:   No meningismus. Full ROM Hematological/Lymphatic/Immunilogical:   No cervical lymphadenopathy. Cardiovascular:   RRR. Symmetric bilateral radial and DP pulses.  No murmurs.  Respiratory:   Normal respiratory effort without tachypnea/retractions. Breath sounds are clear and equal bilaterally. No wheezes/rales/rhonchi. Gastrointestinal:   Soft and nontender. Non distended. There is no CVA tenderness.  No rebound, rigidity, or  guarding. Genitourinary:   deferred Musculoskeletal:   Normal range of motion in all extremities. No joint effusions.  No lower extremity tenderness.  No edema. Neurologic:   Normal speech and language.  Motor grossly intact. No gross focal neurologic deficits are appreciated.  Skin:    Skin is warm, dry and intact. No rash noted.  No petechiae, purpura, or bullae.  ____________________________________________    LABS (pertinent positives/negatives) (all labs ordered are listed, but only abnormal results are displayed) Labs Reviewed  CBC - Abnormal; Notable for the following:       Result Value   RBC 5.54 (*)    All other components within normal limits  URINALYSIS, COMPLETE (UACMP) WITH MICROSCOPIC - Abnormal; Notable for the following:    Color, Urine STRAW (*)    APPearance CLEAR (*)    Specific Gravity, Urine 1.004 (*)    Leukocytes, UA SMALL (*)    Squamous Epithelial / LPF 0-5 (*)    All other components within normal limits  BASIC METABOLIC PANEL  TROPONIN I  CBG MONITORING, ED  POC URINE PREG, ED  POCT PREGNANCY, URINE   ____________________________________________   EKG  Sinus tachycardia rate 128, normal axis intervals curious ST segments and T waves.  ____________________________________________    RADIOLOGY  No results found.  ____________________________________________   PROCEDURES Procedures  ____________________________________________   INITIAL IMPRESSION / ASSESSMENT AND PLAN / ED COURSE  Pertinent labs & imaging results that were available during my care of the patient were reviewed by me and considered in my medical decision making (see chart for details).  Patient well appearing no acute distress. On my exam heart rate is normal. Symptoms are consistent with vertigo.Considering the patient's symptoms, medical history, and physical examination today, I have low suspicion for ischemic stroke, intracranial hemorrhage, meningitis,  encephalitis, carotid or vertebral dissection, venous sinus thrombosis, MS, intracranial hypertension, glaucoma, CRAO, CRVO, or temporal arteritis. Recommended low-dose prednisone, meclizine, follow-up with primary care.      ____________________________________________   FINAL CLINICAL IMPRESSION(S) / ED DIAGNOSES  Final diagnoses:  Vertigo      New Prescriptions   MECLIZINE (ANTIVERT) 25 MG TABLET    Take 1 tablet (25 mg total) by mouth 3 (three) times daily as needed for dizziness or nausea.   PREDNISONE (DELTASONE) 20 MG TABLET    Take 1 tablet (20 mg total) by mouth daily.     Portions of this note were generated with dragon dictation software. Dictation errors may occur despite best attempts at proofreading.    Sharman Cheek, MD 09/16/16 [phone]

## 2016-09-16 NOTE — ED Triage Notes (Signed)
Pt presents to ED via POV with c/o bilateral ear pain x 4-5 days. Pt also c/o dizziness with intermittent SHOB and left sided chest pain without radiation x"7 months". Pt states chest pain "doesn't hurt all the time...just sometimes". Pt is A&O, in NAD; RR even, regular and unlabored.

## 2017-05-01 LAB — HM PAP SMEAR: HM Pap smear: NEGATIVE

## 2017-07-24 ENCOUNTER — Emergency Department
Admission: EM | Admit: 2017-07-24 | Discharge: 2017-07-24 | Disposition: A | Discharge status: Home / Self Care | Payer: Self-pay | Attending: Student in an Organized Health Care Education/Training Program | Admitting: Student in an Organized Health Care Education/Training Program

## 2017-07-24 ENCOUNTER — Other Ambulatory Visit: Payer: Self-pay

## 2017-07-24 DIAGNOSIS — F17210 Nicotine dependence, cigarettes, uncomplicated: Secondary | ICD-10-CM | POA: Insufficient documentation

## 2017-07-24 DIAGNOSIS — R1013 Epigastric pain: Secondary | ICD-10-CM

## 2017-07-24 DIAGNOSIS — Z79899 Other long term (current) drug therapy: Secondary | ICD-10-CM | POA: Insufficient documentation

## 2017-07-24 DIAGNOSIS — N3000 Acute cystitis without hematuria: Secondary | ICD-10-CM | POA: Insufficient documentation

## 2017-07-24 LAB — COMPREHENSIVE METABOLIC PANEL
ALT: 60 U/L — AB (ref 14–54)
ANION GAP: 9 (ref 5–15)
AST: 56 U/L — ABNORMAL HIGH (ref 15–41)
Albumin: 4.3 g/dL (ref 3.5–5.0)
Alkaline Phosphatase: 95 U/L (ref 38–126)
BILIRUBIN TOTAL: 0.2 mg/dL — AB (ref 0.3–1.2)
BUN: 10 mg/dL (ref 6–20)
CO2: 23 mmol/L (ref 22–32)
CREATININE: 0.64 mg/dL (ref 0.44–1.00)
Calcium: 9.2 mg/dL (ref 8.9–10.3)
Chloride: 106 mmol/L (ref 101–111)
GFR calc non Af Amer: >60 mL/min (ref >60)
Glucose, Bld: 85 mg/dL (ref 65–99)
Potassium: 3.8 mmol/L (ref 3.5–5.1)
Sodium: 138 mmol/L (ref 135–145)
Total Protein: 8 g/dL (ref 6.5–8.1)

## 2017-07-24 LAB — URINALYSIS, COMPLETE (UACMP) WITH MICROSCOPIC
Bilirubin Urine: NEGATIVE
GLUCOSE, UA: NEGATIVE mg/dL
Ketones, ur: NEGATIVE mg/dL
Nitrite: NEGATIVE
PROTEIN: NEGATIVE mg/dL
Specific Gravity, Urine: 1.005 (ref 1.005–1.030)
pH: 7 (ref 5.0–8.0)

## 2017-07-24 LAB — CBC
HCT: 44.3 % (ref 35.0–47.0)
HEMOGLOBIN: 14.7 g/dL (ref 12.0–16.0)
MCH: 27.8 pg (ref 26.0–34.0)
MCHC: 33.3 g/dL (ref 32.0–36.0)
MCV: 83.6 fL (ref 80.0–100.0)
Platelets: 244 10*3/uL (ref 150–440)
RBC: 5.3 MIL/uL — AB (ref 3.80–5.20)
RDW: 14.3 % (ref 11.5–14.5)
WBC: 7.5 10*3/uL (ref 3.6–11.0)

## 2017-07-24 LAB — LIPASE, BLOOD: LIPASE: 27 U/L (ref 11–51)

## 2017-07-24 LAB — TROPONIN I: Troponin I: <0.03 ng/mL (ref <0.03)

## 2017-07-24 MED ORDER — CEPHALEXIN 500 MG PO CAPS
500.0000 mg | ORAL_CAPSULE | Freq: Once | ORAL | Status: AC
  Administered 2017-07-24: 500 mg via ORAL

## 2017-07-24 MED ORDER — GI COCKTAIL ~~LOC~~
30.0000 mL | Freq: Once | ORAL | Status: AC
  Administered 2017-07-24: 30 mL via ORAL
  Filled 2017-07-24: qty 30

## 2017-07-24 MED ORDER — CEPHALEXIN 500 MG PO CAPS
ORAL_CAPSULE | ORAL | Status: AC
  Filled 2017-07-24: qty 1

## 2017-07-24 MED ORDER — CEPHALEXIN 500 MG PO CAPS: 500.0000 mg | ORAL_CAPSULE | Freq: Three times a day (TID) | ORAL | 0 refills | Status: AC

## 2017-07-24 MED ORDER — METOCLOPRAMIDE HCL 10 MG PO TABS: 10.0000 mg | ORAL_TABLET | Freq: Four times a day (QID) | ORAL | 0 refills | Status: DC | PRN

## 2017-07-24 MED ORDER — HYDROCODONE-ACETAMINOPHEN 5-325 MG PO TABS: 1.0000 | ORAL_TABLET | ORAL | 0 refills | Status: DC | PRN

## 2017-07-24 NOTE — ED Provider Notes (Addendum)
[facility] Emergency Department Provider Note    First MD Initiated Contact with Patient 07/24/17 2017     (approximate)  I have reviewed the triage vital signs and the nursing notes.   HISTORY  Chief Complaint Abdominal Pain    HPI Jasmine Gray is a 27 y.o. female with a history of reflux as well as recurrent UTIs presents to the ER with generalized abdominal pain initially and suprapubic area now radiating up to her back.  Patient states that she feels like it is consistent with her previous urinary tract infections.  Is also worried that might be part of her reflux.  Denies any vomiting.  No diarrhea.  No hematuria.  Denies any chance of being pregnant.  Denies any vaginal discharge or bleeding.  She is on birth control.  Denies any chest pain or shortness of breath.   She is currently requesting medication for heartburn.  Past Medical History:  Diagnosis Date  . Acid reflux    on no RX at present  . Hemorrhoid 11/2015   starting on past Sat. Pt states it is on the outside  . Tobacco abuse    Family History  Problem Relation Age of Onset  . Heart disease Father   . Cancer Neg Hx   . Depression Neg Hx    Past Surgical History:  Procedure Laterality Date  . NO PAST SURGERIES     Patient Active Problem List   Diagnosis Date Noted  . Overweight (BMI 25.0-29.9) 07/28/2015  . Tobacco user 05/31/2014      Prior to Admission medications   Medication Sig Start Date End Date Taking? Authorizing Provider  cephALEXin (KEFLEX) 500 MG capsule Take 1 capsule (500 mg total) by mouth 3 (three) times daily for 7 days. 07/24/17 07/31/17  Willy Eddy, MD  HYDROcodone-acetaminophen (NORCO) 5-325 MG tablet Take 1 tablet by mouth every 4 (four) hours as needed for moderate pain. 07/24/17   Willy Eddy, MD  meclizine (ANTIVERT) 25 MG tablet Take 1 tablet (25 mg total) by mouth 3 (three) times daily as needed for dizziness or nausea. 09/16/16   Sharman Cheek, MD  metoCLOPramide (REGLAN) 10 MG tablet Take 1 tablet (10 mg total) by mouth every 6 (six) hours as needed. 07/24/17 07/24/18  Willy Eddy, MD  predniSONE (DELTASONE) 20 MG tablet Take 1 tablet (20 mg total) by mouth daily. 09/16/16   Sharman Cheek, MD    Allergies Penicillins    Social History Social History   Tobacco Use  . Smoking status: Current Every Day Smoker    Packs/day: 0.25    Years: 5.00    Pack years: 1.25    Types: Cigarettes  . Smokeless tobacco: Never Used  Substance Use Topics  . Alcohol use: No  . Drug use: No    Review of Systems Patient denies headaches, rhinorrhea, blurry vision, numbness, shortness of breath, chest pain, edema, cough, abdominal pain, nausea, vomiting, diarrhea, dysuria, fevers, rashes or hallucinations unless otherwise stated above in HPI. ____________________________________________   PHYSICAL EXAM:  VITAL SIGNS: Vitals:   07/24/17 1938 07/24/17 2206  BP: (!) 117/96 133/77  Pulse: (!) 112 85  Resp: 18 18  Temp: 99.4 F (37.4 C)   SpO2: 100% 99%    Constitutional: Alert and oriented.  Eyes: Conjunctivae are normal.  Head: Atraumatic. Nose: No congestion/rhinnorhea. Mouth/Throat: Mucous membranes are moist.   Neck: No stridor. Painless ROM.  Cardiovascular: Normal rate, regular rhythm. Grossly normal heart sounds.  Good peripheral circulation. Respiratory: Normal respiratory effort.  No retractions. Lungs CTAB. Gastrointestinal: Soft and nontender. No distention. No abdominal bruits. No CVA tenderness. Genitourinary: deferred Musculoskeletal: No lower extremity tenderness nor edema.  No joint effusions. Neurologic:  Normal speech and language. No gross focal neurologic deficits are appreciated. No facial droop Skin:  Skin is warm, dry and intact. No rash noted. Psychiatric: Mood and affect are normal. Speech and behavior are normal.  ____________________________________________   LABS (all labs ordered  are listed, but only abnormal results are displayed)  Results for orders placed or performed during the hospital encounter of 07/24/17 (from the past 24 hour(s))  CBC     Status: Abnormal   Collection Time: 07/24/17  7:41 PM  Result Value Ref Range   WBC 7.5 3.6 - 11.0 K/uL   RBC 5.30 (H) 3.80 - 5.20 MIL/uL   Hemoglobin 14.7 12.0 - 16.0 g/dL   HCT 40.9 81.1 - 91.4 %   MCV 83.6 80.0 - 100.0 fL   MCH 27.8 26.0 - 34.0 pg   MCHC 33.3 32.0 - 36.0 g/dL   RDW 78.2 95.6 - 21.3 %   Platelets 244 150 - 440 K/uL  Comprehensive metabolic panel     Status: Abnormal   Collection Time: 07/24/17  7:41 PM  Result Value Ref Range   Sodium 138 135 - 145 mmol/L   Potassium 3.8 3.5 - 5.1 mmol/L   Chloride 106 101 - 111 mmol/L   CO2 23 22 - 32 mmol/L   Glucose, Bld 85 65 - 99 mg/dL   BUN 10 6 - 20 mg/dL   Creatinine, Ser 0.86 0.44 - 1.00 mg/dL   Calcium 9.2 8.9 - 57.8 mg/dL   Total Protein 8.0 6.5 - 8.1 g/dL   Albumin 4.3 3.5 - 5.0 g/dL   AST 56 (H) 15 - 41 U/L   ALT 60 (H) 14 - 54 U/L   Alkaline Phosphatase 95 38 - 126 U/L   Total Bilirubin 0.2 (L) 0.3 - 1.2 mg/dL   GFR calc non Af Amer >60 >60 mL/min   GFR calc Af Amer >60 >60 mL/min   Anion gap 9 5 - 15  Lipase, blood     Status: None   Collection Time: 07/24/17  7:41 PM  Result Value Ref Range   Lipase 27 11 - 51 U/L  Troponin I     Status: None   Collection Time: 07/24/17  7:41 PM  Result Value Ref Range   Troponin I <0.03 <0.03 ng/mL  Urinalysis, Complete w Microscopic     Status: Abnormal   Collection Time: 07/24/17  7:53 PM  Result Value Ref Range   Color, Urine STRAW (A) YELLOW   APPearance HAZY (A) CLEAR   Specific Gravity, Urine 1.005 1.005 - 1.030   pH 7.0 5.0 - 8.0   Glucose, UA NEGATIVE NEGATIVE mg/dL   Hgb urine dipstick SMALL (A) NEGATIVE   Bilirubin Urine NEGATIVE NEGATIVE   Ketones, ur NEGATIVE NEGATIVE mg/dL   Protein, ur NEGATIVE NEGATIVE mg/dL   Nitrite NEGATIVE NEGATIVE   Leukocytes, UA LARGE (A) NEGATIVE    RBC / HPF 6-10 0 - 5 RBC/hpf   WBC, UA 11-20 0 - 5 WBC/hpf   Bacteria, UA RARE (A) NONE SEEN   Squamous Epithelial / LPF 11-20 0 - 5   Mucus PRESENT    ____________________________________________  EKG My review and personal interpretation at Time: 20:14   Indication: abd pain  Rate: 110  Rhythm: sinus  Axis: normal Other: normal intervals, no stemi ____________________________________________  RADIOLOGY   ____________________________________________   PROCEDURES  Procedure(s) performed:  Procedures    Critical Care performed: no ____________________________________________   INITIAL IMPRESSION / ASSESSMENT AND PLAN / ED COURSE  Pertinent labs & imaging results that were available during my care of the patient were reviewed by me and considered in my medical decision making (see chart for details).   DDX: gastritis, enteritis, uti, pregnancy, appy, chole  Angie FavaGina M Alamillo is a 27 y.o. who presents to the ED with symptoms as described above.  Her abdominal exam, though obese, is soft and benign at this time.  Blood work sent for the above differential does show evidence of probable UTI.  No leukocytosis.  Borderline elevation of her AST and elevated ALT likely secondary to component of dehydration.  No evidence of biliary obstruction.  Does not have any right upper quadrant pain to suggest cholelithiasis or cholecystitis.  Patient is not pregnant.  Not clinically consistent with torsion, pid or toa. Will give oral pain medication as well as oral antibiotics and reassess.  Clinical Course as of Jul 24 2229  Thu Jul 24, 2017  2155 Repeat abdominal exam benign.  Patient's tachycardia resolved.  At this point believe she stable and appropriate for trial of outpatient management with oral medications.  Discussed signs and symptoms for which she should return immediately to the hospital.  Have discussed with the patient and available family all diagnostics and treatments performed thus far  and all questions were answered to the best of my ability. The patient demonstrates understanding and agreement with plan.    [PR]    Clinical Course User Index [PR] Willy Eddyobinson, Locklan Canoy, MD     As part of my medical decision making, I reviewed the following data within the electronic MEDICAL RECORD NUMBER Nursing notes reviewed and incorporated, Labs reviewed, notes from prior ED visits.  ____________________________________________   FINAL CLINICAL IMPRESSION(S) / ED DIAGNOSES  Final diagnoses:  Acute cystitis without hematuria  Epigastric abdominal pain      NEW MEDICATIONS STARTED DURING THIS VISIT:  New Prescriptions   CEPHALEXIN (KEFLEX) 500 MG CAPSULE    Take 1 capsule (500 mg total) by mouth 3 (three) times daily for 7 days.   HYDROCODONE-ACETAMINOPHEN (NORCO) 5-325 MG TABLET    Take 1 tablet by mouth every 4 (four) hours as needed for moderate pain.   METOCLOPRAMIDE (REGLAN) 10 MG TABLET    Take 1 tablet (10 mg total) by mouth every 6 (six) hours as needed.     Note:  This document was prepared using Dragon voice recognition software and may include unintentional dictation errors.    Willy Eddyobinson, Lanay Zinda, MD 07/24/17 2228    Willy Eddyobinson, Leelynd Maldonado, MD 07/24/17 2231

## 2017-07-24 NOTE — ED Triage Notes (Signed)
Pt in with co generalized abd pain states worse to upper abd. Does radiate to back, states started on Monday. Denies any n.v.d or dysuria.

## 2017-07-24 NOTE — ED Notes (Signed)
Pt ambulatory to POV without difficulty. VSS. NAD. Discharge instructions, RX and follow up instructions given. All questions and concerns addressed

## 2017-07-24 NOTE — Discharge Instructions (Signed)

## 2017-12-02 ENCOUNTER — Encounter: Payer: Self-pay | Admitting: Emergency Medicine

## 2017-12-02 ENCOUNTER — Other Ambulatory Visit: Payer: Self-pay

## 2017-12-02 ENCOUNTER — Emergency Department
Admission: EM | Admit: 2017-12-02 | Discharge: 2017-12-02 | Disposition: A | Discharge status: Home / Self Care | Payer: Self-pay | Attending: Emergency Medicine | Admitting: Emergency Medicine

## 2017-12-02 DIAGNOSIS — F17210 Nicotine dependence, cigarettes, uncomplicated: Secondary | ICD-10-CM | POA: Insufficient documentation

## 2017-12-02 DIAGNOSIS — N390 Urinary tract infection, site not specified: Secondary | ICD-10-CM | POA: Insufficient documentation

## 2017-12-02 DIAGNOSIS — R319 Hematuria, unspecified: Secondary | ICD-10-CM

## 2017-12-02 DIAGNOSIS — Z79899 Other long term (current) drug therapy: Secondary | ICD-10-CM | POA: Insufficient documentation

## 2017-12-02 LAB — URINALYSIS, COMPLETE (UACMP) WITH MICROSCOPIC
Bilirubin Urine: NEGATIVE
Glucose, UA: NEGATIVE mg/dL
Ketones, ur: NEGATIVE mg/dL
NITRITE: NEGATIVE
PH: 6 (ref 5.0–8.0)
Protein, ur: 30 mg/dL — AB
RBC / HPF: >50 RBC/hpf — ABNORMAL HIGH (ref 0–5)
SPECIFIC GRAVITY, URINE: 1.004 — AB (ref 1.005–1.030)
WBC, UA: >50 WBC/hpf — ABNORMAL HIGH (ref 0–5)

## 2017-12-02 MED ORDER — PHENAZOPYRIDINE HCL 200 MG PO TABS: 200.0000 mg | ORAL_TABLET | Freq: Three times a day (TID) | ORAL | 0 refills | Status: DC | PRN

## 2017-12-02 MED ORDER — PHENAZOPYRIDINE HCL 200 MG PO TABS
200.0000 mg | ORAL_TABLET | Freq: Once | ORAL | Status: AC
  Administered 2017-12-02: 200 mg via ORAL
  Filled 2017-12-02: qty 1

## 2017-12-02 MED ORDER — SULFAMETHOXAZOLE-TRIMETHOPRIM 800-160 MG PO TABS
1.0000 | ORAL_TABLET | Freq: Once | ORAL | Status: AC
  Administered 2017-12-02: 1 via ORAL
  Filled 2017-12-02: qty 1

## 2017-12-02 MED ORDER — SULFAMETHOXAZOLE-TRIMETHOPRIM 800-160 MG PO TABS: 1.0000 | ORAL_TABLET | Freq: Two times a day (BID) | ORAL | 0 refills | Status: DC

## 2017-12-02 NOTE — ED Notes (Signed)
See triage note  Presents with lower back pain and urinary freq and dysuria  deneis any fever

## 2017-12-02 NOTE — ED Triage Notes (Signed)
Patient reports burning with urination since Friday. Reports intermittent lower abdominal and back discomfort. History of UTI with similar symptoms. Denies fever, N/V.

## 2017-12-02 NOTE — ED Provider Notes (Signed)
[facility] Emergency Department Provider Note   ____________________________________________   First MD Initiated Contact with Patient 12/02/17 [phone]     (approximate)  I have reviewed the triage vital signs and the nursing notes.   HISTORY  Chief Complaint Dysuria    HPI Jasmine Gray is a 27 y.o. female patient presents with 4 days of dysuria and frequency.  Patient denies vaginal discharge.  Patient denies flank pain or fever.  Patient rates her pain as a 2/10.  No palates this measures for complaint.  Past Medical History:  Diagnosis Date  . Acid reflux    on no RX at present  . Hemorrhoid 11/2015   starting on past Sat. Pt states it is on the outside  . Tobacco abuse     Patient Active Problem List   Diagnosis Date Noted  . Overweight (BMI 25.0-29.9) 07/28/2015  . Tobacco user 05/31/2014    Past Surgical History:  Procedure Laterality Date  . NO PAST SURGERIES      Prior to Admission medications   Medication Sig Start Date End Date Taking? Authorizing Provider  HYDROcodone-acetaminophen (NORCO) 5-325 MG tablet Take 1 tablet by mouth every 4 (four) hours as needed for moderate pain. 07/24/17   Willy Eddy, MD  meclizine (ANTIVERT) 25 MG tablet Take 1 tablet (25 mg total) by mouth 3 (three) times daily as needed for dizziness or nausea. 09/16/16   Sharman Cheek, MD  metoCLOPramide (REGLAN) 10 MG tablet Take 1 tablet (10 mg total) by mouth every 6 (six) hours as needed. 07/24/17 07/24/18  Willy Eddy, MD  predniSONE (DELTASONE) 20 MG tablet Take 1 tablet (20 mg total) by mouth daily. 09/16/16   Sharman Cheek, MD    Allergies Penicillins  Family History  Problem Relation Age of Onset  . Heart disease Father   . Cancer Neg Hx   . Depression Neg Hx     Social History Social History   Tobacco Use  . Smoking status: Current Every Day Smoker    Packs/day: 0.25    Years: 5.00    Pack years: 1.25    Types: Cigarettes    . Smokeless tobacco: Never Used  Substance Use Topics  . Alcohol use: No  . Drug use: No    Review of Systems Constitutional: No fever/chills Eyes: No visual changes. ENT: No sore throat. Cardiovascular: Denies chest pain. Respiratory: Denies shortness of breath. Gastrointestinal: No abdominal pain.  No nausea, no vomiting.  No diarrhea.  No constipation. Genitourinary: Positive for dysuria. Musculoskeletal: Negative for back pain. Skin: Negative for rash. Neurological: Negative for headaches, focal weakness or numbness. Allergic/Immunilogical: Penicillin.  ____________________________________________   PHYSICAL EXAM:  VITAL SIGNS: ED Triage Vitals  Enc Vitals Group     BP 12/02/17 0835 135/87     Pulse Rate 12/02/17 0835 (!) 108     Resp 12/02/17 0835 18     Temp 12/02/17 0835 98 F (36.7 C)     Temp Source 12/02/17 0835 Oral     SpO2 12/02/17 0835 100 %     Weight 12/02/17 0836 222 lb (100.7 kg)     Height 12/02/17 0836 5\' 8"  (1.727 m)     Head Circumference --      Peak Flow --      Pain Score 12/02/17 0836 2     Pain Loc --      Pain Edu? --      Excl. in GC? --    Constitutional:  Alert and oriented. Well appearing and in no acute distress. Cardiovascular: Normal rate, regular rhythm. Grossly normal heart sounds.  Good peripheral circulation. Respiratory: Normal respiratory effort.  No retractions. Lungs CTAB. Gastrointestinal: Soft and nontender. No distention. No abdominal bruits. No CVA tenderness. Genitourinary: Deferred Musculoskeletal: No lower extremity tenderness nor edema.  No joint effusions. Neurologic:  Normal speech and language. No gross focal neurologic deficits are appreciated. No gait instability. Skin:  Skin is warm, dry and intact. No rash noted. Psychiatric: Mood and affect are normal. Speech and behavior are normal.  ____________________________________________   LABS (all labs ordered are listed, but only abnormal results are  displayed)  Labs Reviewed  URINALYSIS, COMPLETE (UACMP) WITH MICROSCOPIC - Abnormal; Notable for the following components:      Result Value   Color, Urine YELLOW (*)    APPearance CLOUDY (*)    Specific Gravity, Urine 1.004 (*)    Hgb urine dipstick LARGE (*)    Protein, ur 30 (*)    Leukocytes, UA LARGE (*)    RBC / HPF >50 (*)    WBC, UA >50 (*)    Bacteria, UA FEW (*)    All other components within normal limits  POC URINE PREG, ED   ____________________________________________  EKG   ____________________________________________  RADIOLOGY  ED MD interpretation:    Official radiology report(s): No results found.  ____________________________________________   PROCEDURES  Procedure(s) performed: None  Procedures  Critical Care performed: No  ____________________________________________   INITIAL IMPRESSION / ASSESSMENT AND PLAN / ED COURSE  As part of my medical decision making, I reviewed the following data within the electronic MEDICAL RECORD NUMBER    Presents for days of urinary frequency and dysuria.  Patient labs are consistent with urinary tract infection.  Patient given discharge care instruction advised take medication as directed.  Patient advised to follow the [facility] in 10 days.  Return to ED if condition worsens.      ____________________________________________   FINAL CLINICAL IMPRESSION(S) / ED DIAGNOSES  Final diagnoses:  None     ED Discharge Orders    None       Note:  This document was prepared using Dragon voice recognition software and may include unintentional dictation errors.    Joni Reining, PA-C 12/02/17 0957    Rockne Menghini, MD 12/02/17 [phone]

## 2018-02-04 ENCOUNTER — Other Ambulatory Visit: Payer: Self-pay | Admitting: Physician Assistant

## 2018-02-05 MED ORDER — SULFAMETHOXAZOLE-TRIMETHOPRIM 800-160 MG PO TABS: 1.0000 | ORAL_TABLET | Freq: Two times a day (BID) | ORAL | 0 refills | Status: DC

## 2018-04-08 ENCOUNTER — Other Ambulatory Visit: Payer: Self-pay | Admitting: Physician Assistant

## 2018-04-30 ENCOUNTER — Other Ambulatory Visit: Payer: Self-pay | Admitting: Physician Assistant

## 2018-04-30 ENCOUNTER — Telehealth: Payer: Self-pay | Admitting: Family

## 2018-04-30 DIAGNOSIS — J069 Acute upper respiratory infection, unspecified: Secondary | ICD-10-CM

## 2018-04-30 MED ORDER — FLUTICASONE PROPIONATE 50 MCG/ACT NA SUSP: 2.0000 | Freq: Every day | NASAL | 0 refills | Status: DC

## 2018-04-30 MED ORDER — BENZONATATE 100 MG PO CAPS: 100.0000 mg | ORAL_CAPSULE | Freq: Two times a day (BID) | ORAL | 0 refills | Status: DC | PRN

## 2018-04-30 NOTE — Progress Notes (Signed)

## 2018-05-01 ENCOUNTER — Other Ambulatory Visit: Payer: Self-pay | Admitting: Physician Assistant

## 2018-05-03 ENCOUNTER — Telehealth: Payer: Self-pay | Admitting: Family

## 2018-05-03 DIAGNOSIS — J069 Acute upper respiratory infection, unspecified: Secondary | ICD-10-CM

## 2018-05-03 MED ORDER — PROMETHAZINE-DM 6.25-15 MG/5ML PO SYRP: 5.0000 mL | ORAL_SOLUTION | Freq: Four times a day (QID) | ORAL | 0 refills | Status: DC | PRN

## 2018-05-03 NOTE — Progress Notes (Signed)
Add promethazine dm

## 2018-05-04 ENCOUNTER — Telehealth: Payer: Self-pay | Admitting: Nurse Practitioner

## 2018-05-04 DIAGNOSIS — J00 Acute nasopharyngitis [common cold]: Secondary | ICD-10-CM

## 2018-05-04 NOTE — Progress Notes (Signed)
E-Visit for Corona Virus Screening  Based on your current symptoms, it seems unlikely that your symptoms are related to the Coronavirus.   Coronavirus disease 2019 (COVID-19) is a respiratory illness that can spread from person to person. The virus that causes COVID-19 is a new virus that was first identified in the country of [place] but is now found in multiple other countries and has spread to the [place].  Symptoms associated with the virus are mild to severe fever, cough, and shortness of breath. There is currently no vaccine to protect against COVID-19, and there is no specific antiviral treatment for the virus.   To be considered HIGH RISK for Coronavirus (COVID-19), you have to meet the following criteria:  . Traveled to [place], [place], [place], [place] or [place]; or in the [place] to [place], [place], [place], or [place]; and have fever, cough, and shortness of breath within the last 2 weeks of travel OR  . Been in close contact with a person diagnosed with COVID-19 within the last 2 weeks and have fever, cough, and shortness of breath  . IF YOU DO NOT MEET THESE CRITERIA, YOU ARE CONSIDERED LOW RISK FOR COVID-19.   It is vitally important that if you feel that you have an infection such as this virus or any other virus that you stay home and away from places where you may spread it to others.  You should self-quarantine for 14 days if you have symptoms that could potentially be coronavirus and avoid contact with people age 27 and older.   I feel that you just current;y have the common code.  You can use medication such as delsym or mucinex OTC for cough  You may also take acetaminophen (Tylenol) as needed for fever.   Reduce your risk of any infection by using the same precautions used for avoiding the common cold or flu:  Marland Kitchen Wash your hands often with soap and warm water for at least 20 seconds.  If soap and water are not readily available, use an alcohol-based hand  sanitizer with at least 60% alcohol.  . If coughing or sneezing, cover your mouth and nose by coughing or sneezing into the elbow areas of your shirt or coat, into a tissue or into your sleeve (not your hands). . Avoid shaking hands with others and consider head nods or verbal greetings only. . Avoid touching your eyes, nose, or mouth with unwashed hands.  . Avoid close contact with people who are sick. . Avoid places or events with large numbers of people in one location, like concerts or sporting events. . Carefully consider travel plans you have or are making. . If you are planning any travel outside or inside the [place], visit the CDC's Travelers' Health webpage for the latest health notices. . If you have some symptoms but not all symptoms, continue to monitor at home and seek medical attention if your symptoms worsen. . If you are having a medical emergency, call 911.  HOME CARE . Only take medications as instructed by your medical team. . Drink plenty of fluids and get plenty of rest. . A steam or ultrasonic humidifier can help if you have congestion.   GET HELP RIGHT AWAY IF: . You develop worsening fever. . You become short of breath . You cough up blood. . Your symptoms become more severe MAKE SURE YOU   Understand these instructions.  Will watch your condition.  Will get help right away if you  are not doing well or get worse.  Your e-visit answers were reviewed by a board certified advanced clinical practitioner to complete your personal care plan.  Depending on the condition, your plan could have included both over the counter or prescription medications.  If there is a problem please reply once you have received a response from your provider. Your safety is important to [place].  If you have drug allergies check your prescription carefully.    You can use MyChart to ask questions about today's visit, request a non-urgent call back, or ask for a work or school excuse for 24 hours  related to this e-Visit. If it has been greater than 24 hours you will need to follow up with your provider, or enter a new e-Visit to address those concerns. You will get an e-mail in the next two days asking about your experience.  I hope that your e-visit has been valuable and will speed your recovery. Thank you for using e-visits.   5 minutes spent reviewing and documenting in chart.

## 2018-05-08 ENCOUNTER — Telehealth: Payer: Self-pay | Admitting: Physician Assistant

## 2018-05-08 ENCOUNTER — Encounter: Payer: Self-pay | Admitting: Physician Assistant

## 2018-05-08 DIAGNOSIS — R0602 Shortness of breath: Secondary | ICD-10-CM

## 2018-05-08 NOTE — Progress Notes (Signed)
Based on what you shared with me, I feel your condition warrants further evaluation and I recommend that you be seen for a face to face office visit.     NOTE: If you entered your credit card information for this eVisit, you will not be charged. You may see a "hold" on your card for the $35 but that hold will drop off and you will not have a charge processed.  If you are having a true medical emergency please call 911.   PLEASE NOTE: THE [facility] LOCATIONS AND URGENT CARE CLINICS DO NOT HAVE THE TESTING FOR CORONAVIRUS COVID19 AVAILABLE.  IF YOU FEEL YOU NEED THIS TEST YOU MUST GO TO A TRIAGE LOCATION AT ONE OF THE HOSPITAL EMERGENCY DEPARTMENTS   . [facility]  [phone] Get Driving Directions Find a Provider at this Location  [address] [place], [place] [address] . 10 am to 8 pm Monday-Friday . 12 pm to 8 pm Saturday-Sunday   . [facility]  [phone] Get Driving Directions Find a Provider at this Location  [address], [address] [place], [place] [address] . 8 am to 8 pm Monday-Friday . 9 am to 6 pm Saturday . 11 am to 6 pm Sunday   . [facility] [place]  [phone] Get Driving Directions  [address] [place], [place] [address] . 8 am to 8 pm Monday-Friday . 8 am to 4 pm Saturday-Sunday   Your e-visit answers were reviewed by a board certified advanced clinical practitioner to complete your personal care plan.  Thank you for using e-Visits.  

## 2018-06-08 ENCOUNTER — Other Ambulatory Visit: Payer: Self-pay

## 2018-06-08 ENCOUNTER — Emergency Department
Admission: EM | Admit: 2018-06-08 | Discharge: 2018-06-08 | Disposition: A | Discharge status: Home / Self Care | Payer: Self-pay | Attending: Emergency Medicine | Admitting: Emergency Medicine

## 2018-06-08 ENCOUNTER — Other Ambulatory Visit: Payer: Self-pay | Admitting: Physician Assistant

## 2018-06-08 ENCOUNTER — Encounter: Payer: Self-pay | Admitting: Emergency Medicine

## 2018-06-08 ENCOUNTER — Telehealth: Payer: Self-pay | Admitting: Family

## 2018-06-08 DIAGNOSIS — R109 Unspecified abdominal pain: Secondary | ICD-10-CM

## 2018-06-08 DIAGNOSIS — N3090 Cystitis, unspecified without hematuria: Secondary | ICD-10-CM | POA: Insufficient documentation

## 2018-06-08 DIAGNOSIS — R399 Unspecified symptoms and signs involving the genitourinary system: Secondary | ICD-10-CM

## 2018-06-08 DIAGNOSIS — F17210 Nicotine dependence, cigarettes, uncomplicated: Secondary | ICD-10-CM | POA: Insufficient documentation

## 2018-06-08 LAB — COMPREHENSIVE METABOLIC PANEL
ALT: 24 U/L (ref 0–44)
AST: 18 U/L (ref 15–41)
Albumin: 4.5 g/dL (ref 3.5–5.0)
Alkaline Phosphatase: 71 U/L (ref 38–126)
Anion gap: 11 (ref 5–15)
BUN: 9 mg/dL (ref 6–20)
CO2: 24 mmol/L (ref 22–32)
Calcium: 9.5 mg/dL (ref 8.9–10.3)
Chloride: 104 mmol/L (ref 98–111)
Creatinine, Ser: 0.7 mg/dL (ref 0.44–1.00)
GFR calc Af Amer: >60 mL/min (ref >60)
GFR calc non Af Amer: >60 mL/min (ref >60)
Glucose, Bld: 83 mg/dL (ref 70–99)
Potassium: 3.5 mmol/L (ref 3.5–5.1)
Sodium: 139 mmol/L (ref 135–145)
Total Bilirubin: 0.2 mg/dL — ABNORMAL LOW (ref 0.3–1.2)
Total Protein: 7.8 g/dL (ref 6.5–8.1)

## 2018-06-08 LAB — URINALYSIS, COMPLETE (UACMP) WITH MICROSCOPIC
Bacteria, UA: NONE SEEN
Bilirubin Urine: NEGATIVE
Glucose, UA: NEGATIVE mg/dL
Ketones, ur: NEGATIVE mg/dL
Nitrite: NEGATIVE
Protein, ur: NEGATIVE mg/dL
Specific Gravity, Urine: 1.013 (ref 1.005–1.030)
pH: 5 (ref 5.0–8.0)

## 2018-06-08 LAB — CBC
HCT: 43.7 % (ref 36.0–46.0)
Hemoglobin: 14.7 g/dL (ref 12.0–15.0)
MCH: 29.3 pg (ref 26.0–34.0)
MCHC: 33.6 g/dL (ref 30.0–36.0)
MCV: 87.1 fL (ref 80.0–100.0)
Platelets: 218 10*3/uL (ref 150–400)
RBC: 5.02 MIL/uL (ref 3.87–5.11)
RDW: 12.7 % (ref 11.5–15.5)
WBC: 9.7 10*3/uL (ref 4.0–10.5)
nRBC: 0 % (ref 0.0–0.2)

## 2018-06-08 LAB — POCT PREGNANCY, URINE: Preg Test, Ur: NEGATIVE

## 2018-06-08 LAB — LIPASE, BLOOD: Lipase: 27 U/L (ref 11–51)

## 2018-06-08 MED ORDER — PHENAZOPYRIDINE HCL 200 MG PO TABS
200.0000 mg | ORAL_TABLET | Freq: Once | ORAL | Status: AC
  Administered 2018-06-08: 200 mg via ORAL
  Filled 2018-06-08: qty 1

## 2018-06-08 MED ORDER — SULFAMETHOXAZOLE-TRIMETHOPRIM 800-160 MG PO TABS: 1.0000 | ORAL_TABLET | Freq: Two times a day (BID) | ORAL | 0 refills | Status: AC

## 2018-06-08 MED ORDER — PHENAZOPYRIDINE HCL 200 MG PO TABS: 200.0000 mg | ORAL_TABLET | Freq: Three times a day (TID) | ORAL | 0 refills | Status: DC | PRN

## 2018-06-08 MED ORDER — SULFAMETHOXAZOLE-TRIMETHOPRIM 800-160 MG PO TABS
1.0000 | ORAL_TABLET | Freq: Once | ORAL | Status: AC
  Administered 2018-06-08: 1 via ORAL
  Filled 2018-06-08: qty 1

## 2018-06-08 NOTE — ED Triage Notes (Signed)
Pt in via POV, reports RUQ abdominal pain w/ nausea x 2 days, denies vomiting, or diarrhea.  Ambulatory to triage, NAD noted at this time.

## 2018-06-08 NOTE — Progress Notes (Signed)
Based on what you shared with me, I feel your condition warrants further evaluation and I recommend that you be seen for a face to face office visit.  Given your symptoms of UTI and back pain it would be best to be seen face-to-face to be evaluated for a more serious condition.    NOTE: If you entered your credit card information for this eVisit, you will not be charged. You may see a "hold" on your card for the $35 but that hold will drop off and you will not have a charge processed.  If you are having a true medical emergency please call 911.  If you need an urgent face to face visit, [place] has four urgent care centers for your convenience.    PLEASE NOTE: THE [facility] AND [facility]ARE CLINICS DO NOT HAVE THE TESTING FOR CORONAVIRUS COVID19 AVAILABLE.  IF YOU FEEL YOU NEED THIS TEST YOU MUST GO TO A TRIAGE LOCATION AT ONE OF THE HOSPITAL EMERGENCY DEPARTMENTS   [email or web address] to reserve your spot online an avoid wait times  [facility] [address], [address] [place], [place] [address] Modified hours of operation: Monday-Friday, 12 PM to 6 PM  Saturday & Sunday 10 AM to 4 PM *Across the street from [employer]  [employer] (New Address!) [address] [place], [place] [address] *Just off [employer], across the road from [place]* Modified hours of operation: Monday-Friday, 12 PM to 6 PM  Closed Saturday & Sunday  [facility]'s modified hours of operation will be in effect from May 1 until May 31   The following sites will take your insurance:  . [facility]  [phone] Get Driving Directions Find a Provider at this Location  [address] [place], [place] [address] . 10 am to 8 pm Monday-Friday . 12 pm to 8 pm Saturday-Sunday   . [facility]  [phone] Get Driving Directions Find a Provider at this Location  [address]  [address] [place], [place] [address] . 8 am to 8 pm Monday-Friday . 9 am to 6 pm Saturday . 11 am to 6 pm Sunday   . [facility] at [facility]  [phone] Get Driving Directions  [address] [place], [place] [address] . 8 am to 8 pm Monday-Friday . 8 am to 4 pm Saturday-Sunday   Your e-visit answers were reviewed by a board certified advanced clinical practitioner to complete your personal care plan.  Thank you for using e-Visits.

## 2018-06-08 NOTE — ED Provider Notes (Signed)
[facility] Emergency Department Provider Note  ____________________________________________  Time seen: Approximately 9:08 PM  I have reviewed the triage vital signs and the nursing notes.   HISTORY  Chief Complaint Abdominal Pain    HPI Jasmine Gray is a 28 y.o. female with past medical history of recurrent UTI who comes the ED complaining of epigastric abdominal pain radiating to bilateral flanks, gradual onset 2 days ago, constant, waxing and waning, no aggravating or alleviating factors.  Associated with nausea but no vomiting diarrhea or constipation.  Feels like prior UTIs.  Also has noted that she has urinary frequency and dysuria.      Past Medical History:  Diagnosis Date  . Acid reflux    on no RX at present  . Hemorrhoid 11/2015   starting on past Sat. Pt states it is on the outside  . Tobacco abuse      Patient Active Problem List   Diagnosis Date Noted  . Overweight (BMI 25.0-29.9) 07/28/2015  . Tobacco user 05/31/2014     Past Surgical History:  Procedure Laterality Date  . NO PAST SURGERIES       Prior to Admission medications   Medication Sig Start Date End Date Taking? Authorizing Provider  acetaminophen (TYLENOL) 500 MG tablet Take 500-1,000 mg by mouth every 6 (six) hours as needed for mild pain or fever.   Yes [provider]  ibuprofen (ADVIL) 200 MG tablet Take 200 mg by mouth every 6 (six) hours as needed for fever or mild pain.   Yes [provider]  benzonatate (TESSALON) 100 MG capsule Take 1 capsule (100 mg total) by mouth 2 (two) times daily as needed for cough. Patient not taking: Reported on 06/08/2018 04/30/18   Eulis FosterWebb, Padonda B, FNP  fluticasone [facility](FLONASE) 50 MCG/ACT nasal spray Place 2 sprays into both nostrils daily. Patient not taking: Reported on 06/08/2018 04/30/18   Eulis FosterWebb, Padonda B, FNP  phenazopyridine (PYRIDIUM) 200 MG tablet Take 1 tablet (200 mg total) by mouth 3 (three) times daily as  needed for pain. 06/08/18   Sharman CheekStafford, Salley Boxley, MD  promethazine-dextromethorphan (PROMETHAZINE-DM) 6.25-15 MG/5ML syrup Take 5 mLs by mouth 4 (four) times daily as needed. Patient not taking: Reported on 06/08/2018 05/03/18   Worthy RancherWebb, Padonda B, FNP  sulfamethoxazole-trimethoprim (BACTRIM DS) 800-160 MG tablet Take 1 tablet by mouth 2 (two) times daily for 5 days. 06/08/18 06/13/18  Sharman CheekStafford, Nou Chard, MD     Allergies Penicillins   Family History  Problem Relation Age of Onset  . Heart disease Father   . Cancer Neg Hx   . Depression Neg Hx     Social History Social History   Tobacco Use  . Smoking status: Current Every Day Smoker    Packs/day: 0.25    Years: 5.00    Pack years: 1.25    Types: Cigarettes  . Smokeless tobacco: Never Used  Substance Use Topics  . Alcohol use: No  . Drug use: No    Review of Systems  Constitutional:   No fever or chills.  ENT:   No sore throat. No rhinorrhea. Cardiovascular:   No chest pain or syncope. Respiratory:   No dyspnea or cough. Gastrointestinal:   As advanced above for abdominal pain without vomiting or diarrhea Musculoskeletal:   Negative for focal pain or swelling All other systems reviewed and are negative except as documented above in ROS and HPI.  ____________________________________________   PHYSICAL EXAM:  VITAL SIGNS: ED Triage Vitals [06/08/18 1643]  Enc Vitals Group     BP (!) 148/90     Pulse Rate (!) 117     Resp 16     Temp 98.3 F (36.8 C)     Temp Source Oral     SpO2 100 %     Weight 215 lb (97.5 kg)     Height 5\' 8"  (1.727 m)     Head Circumference      Peak Flow      Pain Score 5     Pain Loc      Pain Edu?      Excl. in GC?     Vital signs reviewed, nursing assessments reviewed.   Constitutional:   Alert and oriented. Non-toxic appearance. Eyes:   Conjunctivae are normal. EOMI. PERRL. ENT      Head:   Normocephalic and atraumatic.      Nose:   No congestion/rhinnorhea.       Mouth/Throat:    MMM, no pharyngeal erythema. No peritonsillar mass.       Neck:   No meningismus. Full ROM. Hematological/Lymphatic/Immunilogical:   No cervical lymphadenopathy. Cardiovascular:   RRR. Symmetric bilateral radial and DP pulses.  No murmurs. Cap refill less than 2 seconds. Respiratory:   Normal respiratory effort without tachypnea/retractions. Breath sounds are clear and equal bilaterally. No wheezes/rales/rhonchi. Gastrointestinal:   Soft and nontender. Non distended. There is no CVA tenderness.  No rebound, rigidity, or guarding. Musculoskeletal:   Normal range of motion in all extremities. No joint effusions.  No lower extremity tenderness.  No edema. Neurologic:   Normal speech and language.  Motor grossly intact. No acute focal neurologic deficits are appreciated.  Skin:    Skin is warm, dry and intact. No rash noted.  No petechiae, purpura, or bullae.  ____________________________________________    LABS (pertinent positives/negatives) (all labs ordered are listed, but only abnormal results are displayed) Labs Reviewed  COMPREHENSIVE METABOLIC PANEL - Abnormal; Notable for the following components:      Result Value   Total Bilirubin 0.2 (*)    All other components within normal limits  URINALYSIS, COMPLETE (UACMP) WITH MICROSCOPIC - Abnormal; Notable for the following components:   Color, Urine YELLOW (*)    APPearance CLOUDY (*)    Hgb urine dipstick SMALL (*)    Leukocytes,Ua MODERATE (*)    All other components within normal limits  URINE CULTURE  LIPASE, BLOOD  CBC  POC URINE PREG, ED  POCT PREGNANCY, URINE   ____________________________________________   EKG    ____________________________________________    RADIOLOGY  No results found.  ____________________________________________   PROCEDURES Procedures  ____________________________________________  DIFFERENTIAL DIAGNOSIS   Cystitis, low back strain, biliary disease, pancreatitis,  gastritis  CLINICAL IMPRESSION / ASSESSMENT AND PLAN / ED COURSE  Medications ordered in the ED: Medications  sulfamethoxazole-trimethoprim (BACTRIM DS) 800-160 MG per tablet 1 tablet (has no administration in time range)  phenazopyridine (PYRIDIUM) tablet 200 mg (has no administration in time range)    Pertinent labs & imaging results that were available during my care of the patient were reviewed by me and considered in my medical decision making (see chart for details).  Jasmine Gray was evaluated in Emergency Department on 06/08/2018 for the symptoms described in the history of present illness. She was evaluated in the context of the global COVID-19 pandemic, which necessitated consideration that the patient might be at risk for infection with the SARS-CoV-2 virus that causes COVID-19. Institutional protocols and algorithms that pertain  to the evaluation of patients at risk for COVID-19 are in a state of rapid change based on information released by regulatory bodies including the CDC and federal and state organizations. These policies and algorithms were followed during the patient's care in the ED.   Patient presents with epigastric pain radiating to the back in the midst of a syndrome that she says is consistent with prior UTIs.  She states that Bactrim is worked very well for her in the past.  Her exam is completely benign.  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  Labs are all very reassuring except for UA that is equivocal.  Given her symptoms I will treat her with Bactrim and send a urine culture.  On exam her heart rate is 90 and she does note that she was anxious at triage which accounts for her tachycardia on initial vital signs.  She is not septic, does not have pyelonephritis, stable for discharge home and outpatient management.       ____________________________________________   FINAL CLINICAL IMPRESSION(S) / ED DIAGNOSES    Final diagnoses:  Cystitis     ED Discharge Orders         Ordered    sulfamethoxazole-trimethoprim (BACTRIM DS) 800-160 MG tablet  2 times daily     06/08/18 2107    phenazopyridine (PYRIDIUM) 200 MG tablet  3 times daily PRN     06/08/18 2107          Portions of this note were generated with dragon dictation software. Dictation errors may occur despite best attempts at proofreading.   Sharman Cheek, MD 06/08/18 2113

## 2018-06-09 LAB — URINE CULTURE

## 2018-06-10 ENCOUNTER — Telehealth: Payer: Self-pay | Admitting: Nurse Practitioner

## 2018-06-10 DIAGNOSIS — L249 Irritant contact dermatitis, unspecified cause: Secondary | ICD-10-CM

## 2018-06-10 MED ORDER — PREDNISONE 10 MG PO TABS: 10.0000 mg | ORAL_TABLET | Freq: Every day | ORAL | 0 refills | Status: DC

## 2018-06-10 NOTE — Progress Notes (Signed)
E Visit for Rash  We are sorry that you are not feeling well. Here is how we plan to help!  Based on what you shared with me it looks like you have contact dermatitis.  Contact dermatitis is a skin rash caused by something that touches the skin and causes irritation or inflammation.  Your skin may be red, swollen, dry, cracked, and itch.  The rash should go away in a few days but can last a few weeks.  If you get a rash, it's important to figure out what caused it so the irritant can be avoided in the future. and I have prescribed Prednisone 10 mg daily for 5 days   HOME CARE:   Take cool showers and avoid direct sunlight.  Apply cool compress or wet dressings.  Take a bath in an oatmeal bath.  Sprinkle content of one Aveeno packet under running faucet with comfortably warm water.  Bathe for 15-20 minutes, 1-2 times daily.  Pat dry with a towel. Do not rub the rash.  Use hydrocortisone cream.  Take an antihistamine like Benadryl for widespread rashes that itch.  The adult dose of Benadryl is 25-50 mg by mouth 4 times daily.  Caution:  This type of medication may cause sleepiness.  Do not drink alcohol, drive, or operate dangerous machinery while taking antihistamines.  Do not take these medications if you have prostate enlargement.  Read package instructions thoroughly on all medications that you take.  GET HELP RIGHT AWAY IF:   Symptoms don't go away after treatment.  Severe itching that persists.  If you rash spreads or swells.  If you rash begins to smell.  If it blisters and opens or develops a yellow-brown crust.  You develop a fever.  You have a sore throat.  You become short of breath.  MAKE SURE YOU:  Understand these instructions. Will watch your condition. Will get help right away if you are not doing well or get worse.  Thank you for choosing an e-visit. Your e-visit answers were reviewed by a board certified advanced clinical practitioner to complete your  personal care plan. Depending upon the condition, your plan could have included both over the counter or prescription medications. Please review your pharmacy choice. Be sure that the pharmacy you have chosen is open so that you can pick up your prescription now.  If there is a problem you may message your provider in MyChart to have the prescription routed to another pharmacy. Your safety is important to us. If you have drug allergies check your prescription carefully.  For the next 24 hours, you can use MyChart to ask questions about today's visit, request a non-urgent call back, or ask for a work or school excuse from your e-visit provider. You will get an email in the next two days asking about your experience. I hope that your e-visit has been valuable and will speed your recovery.   5-10 minutes spent reviewing and documenting in chart.    

## 2018-08-17 DIAGNOSIS — Z8751 Personal history of pre-term labor: Secondary | ICD-10-CM

## 2018-08-18 ENCOUNTER — Ambulatory Visit (LOCAL_COMMUNITY_HEALTH_CENTER): Payer: Self-pay

## 2018-08-18 ENCOUNTER — Other Ambulatory Visit: Payer: Self-pay

## 2018-08-18 VITALS — BP 123/82 | Ht 68.0 in | Wt 219.0 lb

## 2018-08-18 DIAGNOSIS — Z3009 Encounter for other general counseling and advice on contraception: Secondary | ICD-10-CM

## 2018-08-18 DIAGNOSIS — Z30013 Encounter for initial prescription of injectable contraceptive: Secondary | ICD-10-CM

## 2018-08-18 MED ORDER — MEDROXYPROGESTERONE ACETATE 150 MG/ML IM SUSP
150.0000 mg | Freq: Once | INTRAMUSCULAR | Status: AC
  Administered 2018-08-18: 16:00:00 150 mg via INTRAMUSCULAR

## 2018-08-18 MED ORDER — MULTI-VITAMIN/MINERALS PO TABS: 1.0000 | ORAL_TABLET | Freq: Every day | ORAL | 0 refills | Status: DC

## 2018-08-18 NOTE — Progress Notes (Signed)
Client's physical was due March 2020 and had one-time Depo 06/02/2018. As still not performing physicals due to Covid-19, consult with Hassell Done FNP regarding Depo administration. Per Ms. Marzetta Board, administer Depo 150mg  IM today. Next Depo due 11/02/2018 and reminder card given. Per Ms. Marzetta Board, when client calls to schedule next Depo appt, client encouraged to ask for a physical so can be done with Depo injection. If physicals not yet being done, client to be seen in [facility]. Client verbalized understanding of instructions.

## 2018-08-19 ENCOUNTER — Telehealth: Payer: Self-pay

## 2018-08-19 NOTE — Progress Notes (Signed)
I  was consulted on the POC for this client.  I agree with the documented note and actions taken to provide care for this client. 

## 2018-09-12 ENCOUNTER — Telehealth: Payer: Self-pay | Admitting: Nurse Practitioner

## 2018-09-12 DIAGNOSIS — L03115 Cellulitis of right lower limb: Secondary | ICD-10-CM

## 2018-09-12 MED ORDER — SULFAMETHOXAZOLE-TRIMETHOPRIM 800-160 MG PO TABS: 1.0000 | ORAL_TABLET | Freq: Two times a day (BID) | ORAL | 0 refills | Status: DC

## 2018-09-12 NOTE — Progress Notes (Signed)
E Visit for Cellulitis  We are sorry that you are not feeling well. Here is how we plan to help!  Based on what you shared with me it looks like you have cellulitis.  Cellulitis looks like areas of skin redness, swelling, and warmth; it develops as a result of bacteria entering under the skin. Little red spots and/or bleeding can be seen in skin, and tiny surface sacs containing fluid can occur. Fever can be present. Cellulitis is almost always on one side of a body, and the lower limbs are the most common site of involvement.   I have prescribed:  Bactrim DS 1 tablet by mouth BID for 7 days  HOME CARE:  . Take your medications as ordered and take all of them, even if the skin irritation appears to be healing.   GET HELP RIGHT AWAY IF:  . Symptoms that don't begin to go away within 48 hours. . Severe redness persists or worsens . If the area turns color, spreads or swells. . If it blisters and opens, develops yellow-brown crust or bleeds. . You develop a fever or chills. . If the pain increases or becomes unbearable.  . Are unable to keep fluids and food down.  MAKE SURE YOU    Understand these instructions.  Will watch your condition.  Will get help right away if you are not doing well or get worse.  Thank you for choosing an e-visit. Your e-visit answers were reviewed by a board certified advanced clinical practitioner to complete your personal care plan. Depending upon the condition, your plan could have included both over the counter or prescription medications. Please review your pharmacy choice. Make sure the pharmacy is open so you can pick up prescription now. If there is a problem, you may contact your provider through MyChart messaging and have the prescription routed to another pharmacy. Your safety is important to us. If you have drug allergies check your prescription carefully.  For the next 24 hours you can use MyChart to ask questions about today's visit, request a  non-urgent call back, or ask for a work or school excuse. You will get an email in the next two days asking about your experience. I hope that your e-visit has been valuable and will speed your recovery.  5-10 minutes spent reviewing and documenting in chart.  

## 2018-10-20 ENCOUNTER — Telehealth: Payer: Self-pay | Admitting: Nurse Practitioner

## 2018-10-20 DIAGNOSIS — R05 Cough: Secondary | ICD-10-CM

## 2018-10-20 DIAGNOSIS — R059 Cough, unspecified: Secondary | ICD-10-CM

## 2018-10-20 MED ORDER — BENZONATATE 100 MG PO CAPS: 100.0000 mg | ORAL_CAPSULE | Freq: Three times a day (TID) | ORAL | 0 refills | Status: DC | PRN

## 2018-10-20 NOTE — Progress Notes (Signed)
We are sorry that you are not feeling well.  Here is how we plan to help!  Based on your presentation I believe you most likely have A cough due to a virus.  This is called viral bronchitis and is best treated by rest, plenty of fluids and control of the cough.  You may use Ibuprofen or Tylenol as directed to help your symptoms.     In addition you may use A prescription cough medication called Tessalon Perles 100mg . You may take 1-2 capsules every 8 hours as needed for your cough.  * if you get worse you may want to consider covid testing You can go to one of the  testing sites listed below, while they are opened (see hours). You do not need a doctors order to be tested for covid.You do need to self-isolate until your results return and if positive 14 days from when your symptoms started and until you are 3 days symptom free.   Testing Locations (Monday - Friday, 8 a.m. - 3:30 p.m.) . [place]: [facility], [address], [place], [place]  . [place]: [address] [facility] [place], [place] ([address])  . [place]: [address], [place], [place] (across from [facility])    From your responses in the eVisit questionnaire you describe inflammation in the upper respiratory tract which is causing a significant cough.  This is commonly called Bronchitis and has four common causes:    Allergies  Viral Infections  Acid Reflux  Bacterial Infection Allergies, viruses and acid reflux are treated by controlling symptoms or eliminating the cause. An example might be a cough caused by taking certain blood pressure medications. You stop the cough by changing the medication. Another example might be a cough caused by acid reflux. Controlling the reflux helps control the cough.  USE OF BRONCHODILATOR ("RESCUE") INHALERS: There is a risk from using your bronchodilator too frequently.  The  risk is that over-reliance on a medication which only relaxes the muscles surrounding the breathing tubes can reduce the effectiveness of medications prescribed to reduce swelling and congestion of the tubes themselves.  Although you feel brief relief from the bronchodilator inhaler, your asthma may actually be worsening with the tubes becoming more swollen and filled with mucus.  This can delay other crucial treatments, such as oral steroid medications. If you need to use a bronchodilator inhaler daily, several times per day, you should discuss this with your provider.  There are probably better treatments that could be used to keep your asthma under control.     HOME CARE . Only take medications as instructed by your medical team. . Complete the entire course of an antibiotic. . Drink plenty of fluids and get plenty of rest. . Avoid close contacts especially the very young and the elderly . Cover your mouth if you cough or cough into your sleeve. . Always remember to wash your hands . A steam or ultrasonic humidifier can help congestion.   GET HELP RIGHT AWAY IF: . You develop worsening fever. . You become short of breath . You cough up blood. . Your symptoms persist after you have completed your treatment plan MAKE SURE YOU   Understand these instructions.  Will watch your condition.  Will get help right away if you are not doing well or get worse.  Your e-visit answers were reviewed by a board certified advanced clinical practitioner to  complete your personal care plan.  Depending on the condition, your plan could have included both over the counter or prescription medications. If there is a problem please reply  once you have received a response from your provider. Your safety is important to [place].  If you have drug allergies check your prescription carefully.    You can use MyChart to ask questions about today's visit, request a non-urgent call back, or ask for a work or school excuse  for 24 hours related to this e-Visit. If it has been greater than 24 hours you will need to follow up with your provider, or enter a new e-Visit to address those concerns. You will get an e-mail in the next two days asking about your experience.  I hope that your e-visit has been valuable and will speed your recovery. Thank you for using e-visits.  5-10 minutes spent reviewing and documenting in chart.

## 2018-11-03 ENCOUNTER — Ambulatory Visit (LOCAL_COMMUNITY_HEALTH_CENTER): Payer: Self-pay

## 2018-11-03 ENCOUNTER — Other Ambulatory Visit: Payer: Self-pay

## 2018-11-03 VITALS — BP 130/80 | Ht 68.0 in | Wt 204.0 lb

## 2018-11-03 DIAGNOSIS — Z3009 Encounter for other general counseling and advice on contraception: Secondary | ICD-10-CM

## 2018-11-03 DIAGNOSIS — Z30013 Encounter for initial prescription of injectable contraceptive: Secondary | ICD-10-CM

## 2018-11-03 MED ORDER — MEDROXYPROGESTERONE ACETATE 150 MG/ML IM SUSP
150.0000 mg | Freq: Once | INTRAMUSCULAR | Status: AC
  Administered 2018-11-03: 11:00:00 150 mg via INTRAMUSCULAR

## 2018-11-03 NOTE — Progress Notes (Signed)
Depo given per C. Hampton PA VO; tolerated well Aileen Fass, RN

## 2018-11-30 ENCOUNTER — Telehealth: Payer: Self-pay | Admitting: Physician Assistant

## 2018-11-30 ENCOUNTER — Other Ambulatory Visit: Payer: Self-pay

## 2018-11-30 ENCOUNTER — Telehealth: Payer: Self-pay

## 2018-11-30 DIAGNOSIS — R399 Unspecified symptoms and signs involving the genitourinary system: Secondary | ICD-10-CM

## 2018-11-30 MED ORDER — NITROFURANTOIN MONOHYD MACRO 100 MG PO CAPS: 100.0000 mg | ORAL_CAPSULE | Freq: Two times a day (BID) | ORAL | 0 refills | Status: DC

## 2018-11-30 NOTE — Progress Notes (Signed)

## 2018-12-03 MED ORDER — SULFAMETHOXAZOLE-TRIMETHOPRIM 800-160 MG PO TABS: 1.0000 | ORAL_TABLET | Freq: Two times a day (BID) | ORAL | 0 refills | Status: DC

## 2018-12-03 NOTE — Addendum Note (Signed)
Addended by: Terald Sleeper on: 12/03/2018 09:38 AM   Modules accepted: Orders

## 2018-12-06 ENCOUNTER — Encounter: Payer: Self-pay | Admitting: Intensive Care

## 2018-12-06 ENCOUNTER — Other Ambulatory Visit: Payer: Self-pay

## 2018-12-06 ENCOUNTER — Emergency Department
Admission: EM | Admit: 2018-12-06 | Discharge: 2018-12-06 | Disposition: A | Discharge status: Home / Self Care | Payer: Self-pay | Attending: Emergency Medicine | Admitting: Emergency Medicine

## 2018-12-06 DIAGNOSIS — M545 Low back pain, unspecified: Secondary | ICD-10-CM

## 2018-12-06 DIAGNOSIS — F17210 Nicotine dependence, cigarettes, uncomplicated: Secondary | ICD-10-CM | POA: Insufficient documentation

## 2018-12-06 DIAGNOSIS — R300 Dysuria: Secondary | ICD-10-CM

## 2018-12-06 DIAGNOSIS — Z79899 Other long term (current) drug therapy: Secondary | ICD-10-CM | POA: Insufficient documentation

## 2018-12-06 LAB — URINALYSIS, COMPLETE (UACMP) WITH MICROSCOPIC
Bilirubin Urine: NEGATIVE
Glucose, UA: NEGATIVE mg/dL
Hgb urine dipstick: NEGATIVE
Ketones, ur: NEGATIVE mg/dL
Leukocytes,Ua: NEGATIVE
Nitrite: POSITIVE — AB
Protein, ur: NEGATIVE mg/dL
Specific Gravity, Urine: 1.001 — ABNORMAL LOW (ref 1.005–1.030)
Squamous Epithelial / LPF: NONE SEEN (ref 0–5)
WBC, UA: NONE SEEN WBC/hpf (ref 0–5)
pH: 6 (ref 5.0–8.0)

## 2018-12-06 MED ORDER — LIDOCAINE 5 % EX PTCH
1.0000 | MEDICATED_PATCH | Freq: Once | CUTANEOUS | Status: DC
  Administered 2018-12-06: 1 via TRANSDERMAL
  Filled 2018-12-06: qty 1

## 2018-12-06 MED ORDER — PHENAZOPYRIDINE HCL 100 MG PO TABS: 100.0000 mg | ORAL_TABLET | Freq: Three times a day (TID) | ORAL | 0 refills | Status: DC | PRN

## 2018-12-06 NOTE — ED Notes (Signed)
See triage note  States she was seen and dx'd with UTI  States she was started on 1 med but it was changed to Septra ds    States she is having lower back pain  Slightly more to the right  Ambulates well to treatmentt  room

## 2018-12-06 NOTE — ED Triage Notes (Signed)
Patient reports she did an e-visit with the doctor on 11/30/18 and diagnosed with UTI. Reports she does not feel like her UTI is getting better with antibiotics. Denies discharge or foul smell

## 2018-12-06 NOTE — Discharge Instructions (Addendum)
Follow-up with your primary care provider or congenital clinic acute care if any continued problems.  Begin using moist heat or ice to your back as needed for discomfort.  Increase fluids.  The Pyridium was sent to your pharmacy.  This is a medication that will turn your urine a bright fluorescent yellow/orange but will help with the feeling of urgency.  Continue taking your antibiotic until you are completely finished.  A Lidoderm patch was applied to your back which should help with your pain.  Continue taking the ibuprofen that you have at home 3 times a day with food.Voltaren gel is over the counter and can be used to your back.  The patches over the counter containing lidocaine is cheaper than the prescription type.

## 2018-12-06 NOTE — ED Notes (Signed)
First Nurse Note: Pt to ED via POV c/o UTI. Pt states that she has had e-visit but has not had a UA done. Pt has been on 2 abx.

## 2018-12-06 NOTE — ED Provider Notes (Signed)
[facility] Emergency Department Provider Note  ____________________________________________   First MD Initiated Contact with Patient 12/06/18 1342     (approximate)  I have reviewed the triage vital signs and the nursing notes.   HISTORY  Chief Complaint Urinary Tract Infection   HPI Jasmine Gray is a 28 y.o. female presents to the ED for a recheck of her urine.  Patient states that she had a ED visit with a doctor on 11/30/2018 and was diagnosed with a UTI.  Patient states that she still has some urgency but denies vaginal discharge or a foul odor to her urine.  Patient denies any fever or chills.  There is been no nausea or vomiting.  Patient is on her third day of Bactrim DS.  She also states she is having some right sided low back pain but believes that this was due to working on Friday when she bent over cleaning a room.  She denies any saddle anesthesias or incontinence of bowel bladder.  She continues to ambulate without any assistance.  She rates her pain as 3 out of 10.     Past Medical History:  Diagnosis Date  . Acid reflux    on no RX at present  . Hemorrhoid 11/2015   starting on past Sat. Pt states it is on the outside  . Tobacco abuse     Patient Active Problem List   Diagnosis Date Noted  . History of premature delivery 04/29/2016  . Overweight (BMI 25.0-29.9) 07/28/2015  . Tobacco user 05/31/2014    Past Surgical History:  Procedure Laterality Date  . NO PAST SURGERIES      Prior to Admission medications   Medication Sig Start Date End Date Taking? Authorizing Provider  acetaminophen (TYLENOL) 500 MG tablet Take 500-1,000 mg by mouth every 6 (six) hours as needed for mild pain or fever.    [provider]  ibuprofen (ADVIL) 200 MG tablet Take 200 mg by mouth every 6 (six) hours as needed for fever or mild pain.    [provider]  Multiple Vitamins-Minerals (MULTIVITAMIN WITH MINERALS) tablet Take 1 tablet by  mouth daily. 08/18/18   Hassell Done, FNP  phenazopyridine (PYRIDIUM) 100 MG tablet Take 1 tablet (100 mg total) by mouth 3 (three) times daily as needed for pain. 12/06/18 12/06/19  Johnn Hai, PA-C  sulfamethoxazole-trimethoprim (BACTRIM DS) 800-160 MG tablet Take 1 tablet by mouth 2 (two) times daily. 12/03/18   Terald Sleeper, PA-C  fluticasone (FLONASE) 50 MCG/ACT nasal spray Place 2 sprays into both nostrils daily. Patient not taking: Reported on 06/08/2018 04/30/18 12/06/18  Kennyth Arnold, FNP    Allergies Penicillins  Family History  Problem Relation Age of Onset  . Heart disease Father   . Hypertension Father   . Congestive Heart Failure Father   . Stroke Father   . Hypertension Mother   . Migraines Mother   . Cancer Maternal Grandmother   . Alcohol abuse Maternal Grandfather   . Depression Neg Hx     Social History Social History   Tobacco Use  . Smoking status: Current Every Day Smoker    Packs/day: 0.25    Years: 5.00    Pack years: 1.25    Types: Cigarettes  . Smokeless tobacco: Never Used  Substance Use Topics  . Alcohol use: No  . Drug use: No    Review of Systems Constitutional: No fever/chills Cardiovascular: Denies chest pain. Respiratory: Denies shortness of breath.  Gastrointestinal: No abdominal pain.  No nausea, no vomiting. Genitourinary: Positive for urgency.  Denies burning or frequency at this time.  Negative for vaginal discharge. Musculoskeletal: Positive for right-sided low back pain. Skin: Negative for rash. Neurological: Negative for headaches, focal weakness or numbness. ____________________________________________   PHYSICAL EXAM:  VITAL SIGNS: ED Triage Vitals  Enc Vitals Group     BP 12/06/18 1320 (!) 159/76     Pulse Rate 12/06/18 1320 100     Resp 12/06/18 1320 16     Temp 12/06/18 1320 97.8 F (36.6 C)     Temp Source 12/06/18 1320 Oral     SpO2 12/06/18 1320 97 %     Weight 12/06/18 1321 207 lb (93.9 kg)      Height 12/06/18 1321 5\' 8"  (1.727 m)     Head Circumference --      Peak Flow --      Pain Score 12/06/18 1320 3     Pain Loc --      Pain Edu? --      Excl. in GC? --     Constitutional: Alert and oriented. Well appearing and in no acute distress. Eyes: Conjunctivae are normal.  Head: Atraumatic. Neck: No stridor.   Cardiovascular: Normal rate, regular rhythm. Grossly normal heart sounds.  Good peripheral circulation. Respiratory: Normal respiratory effort.  No retractions. Lungs CTAB. Gastrointestinal: Soft and nontender. No distention.  No CVA tenderness. Musculoskeletal: Examination of the back there is point tenderness near the SI joint and surrounding tissue.  No point tenderness is noted on palpation of the lumbar spine.  Skin is intact and no erythema or discoloration present.  Range of motion is without restriction or muscle spasms. Neurologic:  Normal speech and language. No gross focal neurologic deficits are appreciated. No gait instability. Skin:  Skin is warm, dry and intact.  Psychiatric: Mood and affect are normal. Speech and behavior are normal.  ____________________________________________   LABS (all labs ordered are listed, but only abnormal results are displayed)  Labs Reviewed  URINALYSIS, COMPLETE (UACMP) WITH MICROSCOPIC - Abnormal; Notable for the following components:      Result Value   Color, Urine AMBER (*)    APPearance CLEAR (*)    Specific Gravity, Urine 1.001 (*)    Nitrite POSITIVE (*)    Bacteria, UA RARE (*)    All other components within normal limits  POC URINE PREG, ED     PROCEDURES  Procedure(s) performed (including Critical Care):  Procedures   ____________________________________________   INITIAL IMPRESSION / ASSESSMENT AND PLAN / ED COURSE  As part of my medical decision making, I reviewed the following data within the electronic MEDICAL RECORD NUMBER Notes from prior ED visits and [place] Controlled Substance Database   28 year old female presents to the ED with complaint of urinary urgency.  She had a ED visit with a doctor on 11/30/2018 at which time she was diagnosed with a UTI and prescribed Bactrim DS.  Currently she is on her third day of taking the antibiotic.  She also had an injury with her right low back while cleaning.  She has been taking ibuprofen with some minimal relief.  Physical exam is consistent with a muscle skeletal strain right SI joint area.  Patient was reassured that her urine appears to be clearing without any difficulties.  She is encouraged to take the remaining 7-day course of the antibiotic.  A prescription for Pyridium was sent to her pharmacy to help with her urgency.  A Lidoderm patch was placed on her back for pain relief.  Patient is to continue taking anti-inflammatories at home.  She is encouraged to see her PCP if any continued problems or can no clinic acute care.   ____________________________________________   FINAL CLINICAL IMPRESSION(S) / ED DIAGNOSES  Final diagnoses:  Dysuria  Acute right-sided low back pain without sciatica     ED Discharge Orders         Ordered    phenazopyridine (PYRIDIUM) 100 MG tablet  3 times daily PRN     12/06/18 1355           Note:  This document was prepared using Dragon voice recognition software and may include unintentional dictation errors.    Tommi Rumps, PA-C 12/06/18 1414    Chesley Noon, MD 12/06/18 1622

## 2018-12-29 NOTE — Progress Notes (Signed)
Greater than 5 minutes, yet less than 10 minutes of time have been spent researching, coordinating, and implementing care for this patient today.  Thank you for the details you included in the comment boxes. Those details are very helpful in determining the best course of treatment for you and help us to provide the best care.  

## 2019-01-07 ENCOUNTER — Telehealth: Payer: Self-pay | Admitting: Emergency Medicine

## 2019-01-07 ENCOUNTER — Other Ambulatory Visit: Payer: Self-pay | Admitting: Physician Assistant

## 2019-01-07 DIAGNOSIS — R300 Dysuria: Secondary | ICD-10-CM

## 2019-01-07 DIAGNOSIS — N898 Other specified noninflammatory disorders of vagina: Secondary | ICD-10-CM

## 2019-01-07 NOTE — Progress Notes (Signed)
I'm sorry you are not feeling well.  I have reviewed your medical chart and recent ER visit.  It sounds like you were diagnosed with a urine infection and prescribed antibiotics but your symptoms are not improving.  You reported today there is abdominal pain, vaginal discharge as well.   Based on what you shared with me, I feel your condition warrants further evaluation and I recommend that you be seen for a face to face visit to exclude other possible causes to your symptoms.  You may benefit from a physical exam and possibly a pelvic exam if you are having vaginal discharge.  Your urine can be recollected as well to determine if it is still infected.  Please contact your primary care physician practice to be seen. Many offices offer virtual options to be seen via video if you are not comfortable going in person to a medical facility at this time.  If you do not have a PCP,  offers a free physician referral service available at [phone]. Our trained staff has the experience, knowledge and resources to put you in touch with a physician who is right for you.   You also have the option of a video visit through [email or web address]  If you are having a true medical emergency please call 911.  NOTE: If you entered your credit card information for this eVisit, you will not be charged. You may see a "hold" on your card for the $35 but that hold will drop off and you will not have a charge processed.  Your e-visit answers were reviewed by a board certified advanced clinical practitioner to complete your personal care plan.  Thank you for using e-Visits.

## 2019-01-26 ENCOUNTER — Telehealth: Payer: Self-pay | Admitting: Nurse Practitioner

## 2019-01-26 DIAGNOSIS — R05 Cough: Secondary | ICD-10-CM

## 2019-01-26 DIAGNOSIS — R059 Cough, unspecified: Secondary | ICD-10-CM

## 2019-01-26 DIAGNOSIS — R0989 Other specified symptoms and signs involving the circulatory and respiratory systems: Secondary | ICD-10-CM

## 2019-01-26 DIAGNOSIS — R067 Sneezing: Secondary | ICD-10-CM

## 2019-01-26 DIAGNOSIS — J029 Acute pharyngitis, unspecified: Secondary | ICD-10-CM

## 2019-01-26 NOTE — Progress Notes (Signed)
We are sorry you are not feeling well.  Here is how we plan to help!  Based on what you have shared with me, it looks like you may have a viral upper respiratory infection.  Upper respiratory infections are caused by a large number of viruses; however, rhinovirus is the most common cause.   Symptoms vary from person to person, with common symptoms including sore throat, cough, fatigue or lack of energy and feeling of general discomfort.  A low-grade fever of up to 100.4 may present, but is often uncommon.  Symptoms vary however, and are closely related to a person's age or underlying illnesses.  The most common symptoms associated with an upper respiratory infection are nasal discharge or congestion, cough, sneezing, headache and pressure in the ears and face.  These symptoms usually persist for about 3 to 10 days, but can last up to 2 weeks.  It is important to know that upper respiratory infections do not cause serious illness or complications in most cases.    Upper respiratory infections can be transmitted from person to person, with the most common method of transmission being a person's hands.  The virus is able to live on the skin and can infect other persons for up to 2 hours after direct contact.  Also, these can be transmitted when someone coughs or sneezes; thus, it is important to cover the mouth to reduce this risk.  To keep the spread of the illness at bay, good hand hygiene is very important.  This is an infection that is most likely caused by a virus. There are no specific treatments other than to help you with the symptoms until the infection runs its course.  We are sorry you are not feeling well.  Here is how we plan to help!   For nasal congestion, you may use an oral decongestants such as Mucinex D or if you have glaucoma or high blood pressure use plain Mucinex.  Saline nasal spray or nasal drops can help and can safely be used as often as needed for congestion.  For your congestion,  I have prescribed Fluticasone nasal spray one spray in each nostril twice a day  If you do not have a history of heart disease, hypertension, diabetes or thyroid disease, prostate/bladder issues or glaucoma, you may also use Sudafed to treat nasal congestion.  It is highly recommended that you consult with a pharmacist or your primary care physician to ensure this medication is safe for you to take.     If you have a cough, you may use cough suppressants such as Delsym and Robitussin.  If you have glaucoma or high blood pressure, you can also use Coricidin HBP.   For cough I have prescribed for you A prescription cough medication called Tessalon Perles 100 mg. You may take 1-2 capsules every 8 hours as needed for cough  If you have a sore or scratchy throat, use a saltwater gargle-  to  teaspoon of salt dissolved in a 4-ounce to 8-ounce glass of warm water.  Gargle the solution for approximately 15-30 seconds and then spit.  It is important not to swallow the solution.  You can also use throat lozenges/cough drops and Chloraseptic spray to help with throat pain or discomfort.  Warm or cold liquids can also be helpful in relieving throat pain.  For headache, pain or general discomfort, you can use Ibuprofen or Tylenol as directed.   Some authorities believe that zinc sprays or the use of   Echinacea may shorten the course of your symptoms.  * if you are not improving you will need to consider having a covid test. Here is the information about testing:  Testing Information: The COVID-19 Community Testing sites will begin testing BY APPOINTMENT ONLY.  You can schedule online at [email or web address]  If you do not have access to a smart phone or computer you may call [phone] for an appointment.  Testing Locations: Appointment schedule is 8 am to 3:30 pm at all sites  [facility] indoors at [address], [place] [address] [facility]  indoors at [facility] [address]  [place], [place] [address] [place] indoors at [address] [place] [address]  Additional testing sites in the Community:  . For [facility] Testing sites in [facility]  FarmerBuys.com.au  . For Pop-up testing sites in [place]  https://morgan-vargas.com/  . For Testing sites with regular hours https://onsms.org/[place]/  . For [facility] [email or web address].gonzalez.[email or web address]  . For [facility] EternalVitamin.dk  . For [facility] testing in [place] and [employer] EternalVitamin.dk  . For [facility] testing in [facility]   https://lhi.care/covidtesting  For  more information about community testing call [phone]  HOME CARE . Only take medications as instructed by your medical team. . Be sure to drink plenty of fluids. Water is fine as well as fruit juices, sodas and electrolyte beverages. You may want to stay away from caffeine or alcohol. If you are nauseated, try taking small sips of liquids. How do you know if you are getting enough fluid? Your urine should be a pale yellow or almost colorless. . Get rest. . Taking a steamy shower or using a humidifier may help nasal congestion and ease sore throat pain. You can place a towel over your head and breathe in the steam from hot water coming from a faucet. . Using a saline nasal spray works much the same way. . Cough drops, hard candies and sore throat lozenges may ease your cough. . Avoid close contacts especially the very young and the elderly . Cover your mouth if you cough or sneeze . Always remember to wash your hands.   GET HELP RIGHT AWAY IF: . You develop  worsening fever. . If your symptoms do not improve within 10 days . You develop yellow or green discharge from your nose over 3 days. . You have coughing fits . You develop a severe head ache or visual changes. . You develop shortness of breath, difficulty breathing or start having chest pain . Your symptoms persist after you have completed your treatment plan  MAKE SURE YOU   Understand these instructions.  Will watch your condition.  Will get help right away if you are not doing well or get worse.  Your e-visit answers were reviewed by a board certified advanced clinical practitioner to complete your personal care plan. Depending upon the condition, your plan could have included both over the counter or prescription medications. Please review your pharmacy choice. If there is a problem, you may call our nursing hot line at and have the prescription routed to another pharmacy. Your safety is important to [place]. If you have drug allergies check your prescription carefully.   You can use MyChart to ask questions about today's visit, request a non-urgent call back, or ask for a work or school excuse for 24 hours related to this e-Visit. If it has been greater than 24 hours you will need to follow  up with your provider, or enter a new e-Visit to address those concerns. You will get an e-mail in the next two days asking about your experience.  I hope that your e-visit has been valuable and will speed your recovery. Thank you for using e-visits.  5-10 minutes spent reviewing and documenting in chart.

## 2019-02-18 ENCOUNTER — Other Ambulatory Visit: Payer: Self-pay | Admitting: Physician Assistant

## 2019-02-18 ENCOUNTER — Other Ambulatory Visit: Payer: Self-pay | Admitting: Family Medicine

## 2019-02-18 DIAGNOSIS — Z3009 Encounter for other general counseling and advice on contraception: Secondary | ICD-10-CM

## 2019-02-21 ENCOUNTER — Other Ambulatory Visit: Payer: Self-pay | Admitting: Physician Assistant

## 2019-03-04 ENCOUNTER — Other Ambulatory Visit: Payer: Self-pay

## 2019-03-04 ENCOUNTER — Telehealth: Payer: Self-pay | Admitting: Physician Assistant

## 2019-03-04 ENCOUNTER — Telehealth: Payer: Self-pay

## 2019-03-04 DIAGNOSIS — M5442 Lumbago with sciatica, left side: Secondary | ICD-10-CM

## 2019-03-04 MED ORDER — CYCLOBENZAPRINE HCL 10 MG PO TABS: 10.0000 mg | ORAL_TABLET | Freq: Two times a day (BID) | ORAL | 0 refills | Status: DC | PRN

## 2019-03-04 MED ORDER — NAPROXEN 500 MG PO TABS: 500.0000 mg | ORAL_TABLET | Freq: Two times a day (BID) | ORAL | 0 refills | Status: DC

## 2019-03-04 NOTE — Progress Notes (Signed)

## 2019-03-16 ENCOUNTER — Encounter: Payer: Self-pay | Admitting: Family Medicine

## 2019-03-16 ENCOUNTER — Ambulatory Visit (LOCAL_COMMUNITY_HEALTH_CENTER): Payer: Self-pay | Admitting: Family Medicine

## 2019-03-16 ENCOUNTER — Other Ambulatory Visit: Payer: Self-pay

## 2019-03-16 VITALS — BP 132/73 | Ht 68.0 in | Wt 219.2 lb

## 2019-03-16 DIAGNOSIS — Z720 Tobacco use: Secondary | ICD-10-CM

## 2019-03-16 DIAGNOSIS — Z3009 Encounter for other general counseling and advice on contraception: Secondary | ICD-10-CM

## 2019-03-16 LAB — PREGNANCY, URINE: Preg Test, Ur: NEGATIVE

## 2019-03-16 MED ORDER — THERA VITAL M PO TABS: 1.0000 | ORAL_TABLET | Freq: Every day | ORAL | 0 refills | Status: DC

## 2019-03-16 MED ORDER — MEDROXYPROGESTERONE ACETATE 150 MG/ML IM SUSP
150.0000 mg | INTRAMUSCULAR | Status: AC
  Administered 2019-03-16 – 2020-01-26 (×3): 150 mg via INTRAMUSCULAR

## 2019-03-16 NOTE — Progress Notes (Addendum)
PT negative. Depo given and tolerated well. MVI's given. Tawny Hopping, RN

## 2019-03-16 NOTE — Progress Notes (Signed)
Family Planning Visit  Subjective:  Jasmine Gray is a 29 y.o. being seen today for  Chief Complaint  Patient presents with  . Contraception    Depo    Pt has Tobacco user; Overweight (BMI 25.0-29.9); and History of premature delivery on their problem list.  HPI   Patient reports they are here for depo. Last injection 19 wks ago. Last sex Saturday, no condom.   Pt denies all of the following, which are contraindications to Depo use: Known breast cancer Pregnancy Also denies: Hypertension (CDC cat 2 if mild, cat 3 if severe) Severe cirrhosis, hepatocellular adenoma Diabetes with nephrosis or vascular complications Ischemic heart disease or multiple risk factors for atherosclerotic disease, and some forms of lupus Unexplained vaginal bleeding Pregnancy planned within the next year Long-term use of corticosteroid therapy in women with a history of, or risk factors for, nontraumatic (frailty) fractures.  Current use of aminoglutethimide (usually for the treatment of Cushing's syndrome) because aminoglutethimide may increase metabolism of progestins    Patient's last menstrual period was 03/07/2019 (approximate). Pt desires EC? No, declines  Last pap: 05/01/2017 - NIL   Patient reports 1 partner(s) in last year. Do they desire STI screening (if no, why not)? Declines today.  Does the patient desire a pregnancy in the next year? no   29 y.o., Body mass index is 33.33 kg/m. - Is patient eligible for HA1C diabetes screening based on BMI and age >28?  no  Does the patient have a current or past history of drug use? no No components found for: HCV  See flowsheet for other program required questions.   Health Maintenance Due  Topic Date Due  . PAP SMEAR-Modifier  05/02/2018  . INFLUENZA VACCINE  09/05/2018    ROS  The following portions of the patient's history were reviewed and updated as appropriate: allergies, current medications, past family history, past medical  history, past social history, past surgical history and problem list. Problem list updated.  Objective:  BP 132/73   Ht 5\' 8"  (1.727 m)   Wt 219 lb 3.2 oz (99.4 kg)   LMP 03/07/2019 (Approximate) Comment: Spotting  BMI 33.33 kg/m    Physical Exam  Gen: well appearing, NAD HEENT: no scleral icterus Lung: Normal WOB Ext: warm well perfused     Assessment and Plan:  Jasmine Gray is a 29 y.o. female presenting to the [facility] Department for a well woman exam/family planning visit  Contraception counseling: Reviewed all forms of birth control options in the tiered based approach including abstinence; over the counter/barrier methods; hormonal contraceptive medication including pill, patch, ring, injection, contraceptive implant; hormonal and nonhormonal IUDs; permanent sterilization options including vasectomy and the various tubal sterilization modalities. Risks, benefits, how to discontinue and typical effectiveness rates were reviewed.  Questions were answered.  Written information was also given to the patient to review.  Patient desires depo, this was prescribed for patient. She will follow up in  3 months for surveillance.  She was told to call with any further questions, or with any concerns about this method of contraception.  Emphasized use of condoms 100% of the time for STI prevention.  Emergency Contraception: Pt was offered ECP. ECP was not accepted by pt.- she declines  1. Family planning services Rx depo x 1 yr.  -We discussed that since pt has had unprotected sex 3 d ago she is at risk of pregnancy even though urine preg test is negative today. Discussed risks and benefits  of following options: Depo today + home preg test in 11 days or wait 11 days, RTC for preg test + Depo. She verbalizes understanding of risks and prefers to get Depo today, take home preg test in 11 days.  -Pt to use backup contraception for 2 weeks. Recommended condoms 100% of time for STI  protection. -Needs pap in 1 yr. - medroxyPROGESTERone (DEPO-PROVERA) injection 150 mg - Multiple Vitamins-Minerals (MULTIVITAMIN) tablet; Take 1 tablet by mouth daily.  Dispense: 100 tablet; Refill: 0  2. Tobacco use Encouraged pt in their desire to quit, counseled using 5 A's. Tobacco Quitline info given and offered referral for behavioral health which pt declines for now.   Return in about 3 months (around 06/13/2019) for Depo.  No future appointments.  Jasmine Keen, PA-C

## 2019-03-16 NOTE — Progress Notes (Signed)
Here today for late Depo. Last PE and Pap Smear (NIL) was 05/01/2017. Last Depo was 11/03/2018 (19.0 weeks.) Declines all STD screening today. Tawny Hopping, RN

## 2019-03-27 ENCOUNTER — Telehealth: Payer: Self-pay | Admitting: Nurse Practitioner

## 2019-03-27 DIAGNOSIS — N3000 Acute cystitis without hematuria: Secondary | ICD-10-CM

## 2019-03-27 MED ORDER — SULFAMETHOXAZOLE-TRIMETHOPRIM 800-160 MG PO TABS: 1.0000 | ORAL_TABLET | Freq: Two times a day (BID) | ORAL | 0 refills | Status: DC

## 2019-03-27 MED ORDER — NITROFURANTOIN MONOHYD MACRO 100 MG PO CAPS: 100.0000 mg | ORAL_CAPSULE | Freq: Two times a day (BID) | ORAL | 0 refills | Status: DC

## 2019-03-27 MED ORDER — CEPHALEXIN 500 MG PO CAPS: 500.0000 mg | ORAL_CAPSULE | Freq: Two times a day (BID) | ORAL | 0 refills | Status: DC

## 2019-03-27 NOTE — Progress Notes (Signed)

## 2019-03-27 NOTE — Addendum Note (Signed)
Addended by: Bennie Pierini on: 03/27/2019 03:22 PM   Modules accepted: Orders

## 2019-03-27 NOTE — Addendum Note (Signed)
Addended by: Bennie Pierini on: 03/27/2019 03:02 PM   Modules accepted: Orders

## 2019-03-29 ENCOUNTER — Emergency Department
Admission: EM | Admit: 2019-03-29 | Discharge: 2019-03-29 | Disposition: A | Discharge status: Home / Self Care | Payer: Self-pay | Attending: Emergency Medicine | Admitting: Emergency Medicine

## 2019-03-29 ENCOUNTER — Emergency Department: Payer: Self-pay

## 2019-03-29 ENCOUNTER — Other Ambulatory Visit: Payer: Self-pay

## 2019-03-29 ENCOUNTER — Encounter: Payer: Self-pay | Admitting: Emergency Medicine

## 2019-03-29 DIAGNOSIS — F17210 Nicotine dependence, cigarettes, uncomplicated: Secondary | ICD-10-CM | POA: Insufficient documentation

## 2019-03-29 DIAGNOSIS — I493 Ventricular premature depolarization: Secondary | ICD-10-CM | POA: Insufficient documentation

## 2019-03-29 DIAGNOSIS — R0789 Other chest pain: Secondary | ICD-10-CM | POA: Insufficient documentation

## 2019-03-29 DIAGNOSIS — R110 Nausea: Secondary | ICD-10-CM | POA: Insufficient documentation

## 2019-03-29 DIAGNOSIS — Z79899 Other long term (current) drug therapy: Secondary | ICD-10-CM | POA: Insufficient documentation

## 2019-03-29 DIAGNOSIS — R5383 Other fatigue: Secondary | ICD-10-CM | POA: Insufficient documentation

## 2019-03-29 DIAGNOSIS — R0602 Shortness of breath: Secondary | ICD-10-CM | POA: Insufficient documentation

## 2019-03-29 LAB — CBC
HCT: 45.6 % (ref 36.0–46.0)
Hemoglobin: 15.3 g/dL — ABNORMAL HIGH (ref 12.0–15.0)
MCH: 29.7 pg (ref 26.0–34.0)
MCHC: 33.6 g/dL (ref 30.0–36.0)
MCV: 88.4 fL (ref 80.0–100.0)
Platelets: 191 10*3/uL (ref 150–400)
RBC: 5.16 MIL/uL — ABNORMAL HIGH (ref 3.87–5.11)
RDW: 12.6 % (ref 11.5–15.5)
WBC: 6.7 10*3/uL (ref 4.0–10.5)
nRBC: 0 % (ref 0.0–0.2)

## 2019-03-29 LAB — BASIC METABOLIC PANEL
Anion gap: 10 (ref 5–15)
BUN: 12 mg/dL (ref 6–20)
CO2: 23 mmol/L (ref 22–32)
Calcium: 9.4 mg/dL (ref 8.9–10.3)
Chloride: 106 mmol/L (ref 98–111)
Creatinine, Ser: 0.82 mg/dL (ref 0.44–1.00)
GFR calc Af Amer: >60 mL/min (ref >60)
GFR calc non Af Amer: >60 mL/min (ref >60)
Glucose, Bld: 95 mg/dL (ref 70–99)
Potassium: 3.7 mmol/L (ref 3.5–5.1)
Sodium: 139 mmol/L (ref 135–145)

## 2019-03-29 LAB — TROPONIN I (HIGH SENSITIVITY)
Troponin I (High Sensitivity): 3 ng/L (ref <18)
Troponin I (High Sensitivity): <2 ng/L (ref <18)

## 2019-03-29 MED ORDER — METOPROLOL TARTRATE 25 MG PO TABS: 12.5000 mg | ORAL_TABLET | Freq: Two times a day (BID) | ORAL | 0 refills | Status: DC | PRN

## 2019-03-29 MED ORDER — METOPROLOL TARTRATE 25 MG PO TABS
12.5000 mg | ORAL_TABLET | Freq: Once | ORAL | Status: AC
  Administered 2019-03-29: 12.5 mg via ORAL
  Filled 2019-03-29: qty 1

## 2019-03-29 NOTE — ED Triage Notes (Signed)
Pt reports yesterday started feeling like her heart was fluttering and then today she started with CP to her mid chest, heavy in nature. Pt reports some Nausea as well. Denies that pain radiates.

## 2019-03-29 NOTE — ED Triage Notes (Signed)
Pt reports started an abx yesterday for a UTI and her sx's started shortly after. Pt concerned itn may be a reaction to the abx, bactrim

## 2019-03-29 NOTE — Discharge Instructions (Signed)
As we discussed, your telemetry and EKG showed intermittent premature ventricular contractions, or PVCs.  While these are common, they can occasionally cause symptoms.  For now, drink plenty of fluid, avoid excess caffeine or alcohol, and try to minimize stress.  Try to get at least 8 hours of sleep daily.  I will also prescribe a low-dose beta-blocker, metoprolol.  You do not necessarily have to take this daily.  He can take it as needed for palpitations.  Do not take this more than twice a day, 12 hours apart.  Follow-up with a cardiologist in the next 1 to 2 weeks.  You can also set up an appointment with the PCP.

## 2019-03-29 NOTE — ED Provider Notes (Signed)
[facility] Emergency Department Provider Note  ____________________________________________   First MD Initiated Contact with Patient 03/29/19 1226     (approximate)  I have reviewed the triage vital signs and the nursing notes.   HISTORY  Chief Complaint Chest Pain, Nausea, and Irregular Heart Beat    HPI Jasmine Gray is a 29 y.o. female  Here with palpitations and chest pain. Pt reports that her sx started yesterday as sensation of irregular heart palpitations. She then began to feel a dull, substernal chest pressure with some mild nausea. Mild SOB noted. She then noted ongoing, intermittent palpitations which feel irregular. She states that since then, the pain is improving but she continues to feel regular palpitations. This is new for her. She does admit to poor sleep and increased stress. She smokes cigarettes. Denies any alcohol or other drug use. No specific alleviating or aggravating factors. Father has a h/o CAD, no other h/o arrhythmia.       Past Medical History:  Diagnosis Date  . Acid reflux    on no RX at present  . Hemorrhoid 11/2015   starting on past Sat. Pt states it is on the outside  . Tobacco abuse     Patient Active Problem List   Diagnosis Date Noted  . History of premature delivery 04/29/2016  . Overweight (BMI 25.0-29.9) 07/28/2015  . Tobacco user 05/31/2014    Past Surgical History:  Procedure Laterality Date  . NO PAST SURGERIES      Prior to Admission medications   Medication Sig Start Date End Date Taking? Authorizing Provider  acetaminophen (TYLENOL) 500 MG tablet Take 500-1,000 mg by mouth every 6 (six) hours as needed for mild pain or fever.    [provider]  cephALEXin (KEFLEX) 500 MG capsule Take 1 capsule (500 mg total) by mouth 2 (two) times daily. 03/27/19   Daphine Deutscher, Mary-Margaret, FNP  ibuprofen (ADVIL) 200 MG tablet Take 200 mg by mouth every 6 (six) hours as needed for fever or mild pain.     [provider]  metoprolol tartrate (LOPRESSOR) 25 MG tablet Take 0.5 tablets (12.5 mg total) by mouth 2 (two) times daily as needed (Palpitations). 03/29/19   Shaune Pollack, MD  Multiple Vitamins-Minerals (MULTIVITAMIN WITH MINERALS) tablet Take 1 tablet by mouth daily. Patient not taking: Reported on 03/16/2019 08/18/18   Larene Pickett, FNP  Multiple Vitamins-Minerals (MULTIVITAMIN) tablet Take 1 tablet by mouth daily. 03/16/19   Staples, Hulda Humphrey, PA-C  nitrofurantoin, macrocrystal-monohydrate, (MACROBID) 100 MG capsule Take 1 capsule (100 mg total) by mouth 2 (two) times daily. 1 po BId 03/27/19   Daphine Deutscher, Mary-Margaret, FNP  sulfamethoxazole-trimethoprim (BACTRIM DS) 800-160 MG tablet Take 1 tablet by mouth 2 (two) times daily. 03/27/19   Daphine Deutscher, Mary-Margaret, FNP  fluticasone (FLONASE) 50 MCG/ACT nasal spray Place 2 sprays into both nostrils daily. Patient not taking: Reported on 06/08/2018 04/30/18 12/06/18  Eulis Foster, FNP    Allergies Penicillins  Family History  Problem Relation Age of Onset  . Heart disease Father   . Hypertension Father   . Congestive Heart Failure Father   . Stroke Father   . Hypertension Mother   . Migraines Mother   . Cancer Maternal Grandmother   . Alcohol abuse Maternal Grandfather   . Depression Neg Hx     Social History Social History   Tobacco Use  . Smoking status: Current Every Day Smoker    Packs/day: 0.25    Years: 5.00  Pack years: 1.25    Types: Cigarettes  . Smokeless tobacco: Never Used  Substance Use Topics  . Alcohol use: No  . Drug use: No    Review of Systems  Review of Systems  Constitutional: Positive for fatigue. Negative for fever.  HENT: Negative for congestion and sore throat.   Eyes: Negative for visual disturbance.  Respiratory: Positive for chest tightness. Negative for cough and shortness of breath.   Cardiovascular: Positive for chest pain and palpitations.  Gastrointestinal: Negative for abdominal  pain, diarrhea, nausea and vomiting.  Genitourinary: Negative for flank pain.  Musculoskeletal: Negative for back pain and neck pain.  Skin: Negative for rash and wound.  Neurological: Negative for weakness.     ____________________________________________  PHYSICAL EXAM:      VITAL SIGNS: ED Triage Vitals  Enc Vitals Group     BP 03/29/19 0940 (!) 142/91     Pulse Rate 03/29/19 1229 73     Resp 03/29/19 0940 20     Temp 03/29/19 0940 98.5 F (36.9 C)     Temp Source 03/29/19 0940 Oral     SpO2 03/29/19 0940 99 %     Weight 03/29/19 0937 214 lb (97.1 kg)     Height 03/29/19 0937 5\' 8"  (1.727 m)     Head Circumference --      Peak Flow --      Pain Score 03/29/19 0936 5     Pain Loc --      Pain Edu? --      Excl. in [place]? --      Physical Exam Vitals and nursing note reviewed.  Constitutional:      General: She is not in acute distress.    Appearance: She is well-developed.  HENT:     Head: Normocephalic and atraumatic.  Eyes:     Conjunctiva/sclera: Conjunctivae normal.  Cardiovascular:     Rate and Rhythm: Normal rate and regular rhythm.  Extrasystoles are present.    Heart sounds: Normal heart sounds. No murmur. No friction rub.  Pulmonary:     Effort: Pulmonary effort is normal. No respiratory distress.     Breath sounds: Normal breath sounds. No wheezing or rales.  Abdominal:     General: There is no distension.     Palpations: Abdomen is soft.     Tenderness: There is no abdominal tenderness.  Musculoskeletal:     Cervical back: Neck supple.  Skin:    General: Skin is warm.     Capillary Refill: Capillary refill takes less than 2 seconds.  Neurological:     Mental Status: She is alert and oriented to person, place, and time.     Motor: No abnormal muscle tone.  Psychiatric:        Mood and Affect: Mood is anxious.       ____________________________________________   LABS (all labs ordered are listed, but only abnormal results are  displayed)  Labs Reviewed  CBC - Abnormal; Notable for the following components:      Result Value   RBC 5.16 (*)    Hemoglobin 15.3 (*)    All other components within normal limits  BASIC METABOLIC PANEL  TROPONIN I (HIGH SENSITIVITY)  TROPONIN I (HIGH SENSITIVITY)    ____________________________________________  EKG: sinus tachycardia with occasional PVCs. VR 104, PR 142, QRS 74, QTc 449. No acute ST elevations or depressions. Frequent PVCs.  ________________________________________  RADIOLOGY All imaging, including plain films, CT scans, and ultrasounds, independently reviewed by  me, and interpretations confirmed via formal radiology reads.  ED MD interpretation:   CXR: Clear  Official radiology report(s): DG Chest 2 View  Result Date: 03/29/2019 CLINICAL DATA:  Chest pain and cardiac arrhythmia EXAM: CHEST - 2 VIEW COMPARISON:  None. FINDINGS: Lungs are clear. Heart size and pulmonary vascularity are normal. No adenopathy. No pneumothorax. There is slight midthoracic dextroscoliosis. IMPRESSION: Lungs clear.  Cardiac silhouette within normal limits. Electronically Signed   By: Bretta Bang III M.D.   On: 03/29/2019 10:01    ____________________________________________  PROCEDURES   Procedure(s) performed (including Critical Care):  Procedures  ____________________________________________  INITIAL IMPRESSION / MDM / ASSESSMENT AND PLAN / ED COURSE  As part of my medical decision making, I reviewed the following data within the electronic MEDICAL RECORD NUMBER Nursing notes reviewed and incorporated, Old chart reviewed, Notes from prior ED visits, and [place] Controlled Substance Database       *KYNNEDI ZWEIG was evaluated in Emergency Department on 03/29/2019 for the symptoms described in the history of present illness. She was evaluated in the context of the global COVID-19 pandemic, which necessitated consideration that the patient might be at risk for infection with the  SARS-CoV-2 virus that causes COVID-19. Institutional protocols and algorithms that pertain to the evaluation of patients at risk for COVID-19 are in a state of rapid change based on information released by regulatory bodies including the CDC and federal and state organizations. These policies and algorithms were followed during the patient's care in the ED.  Some ED evaluations and interventions may be delayed as a result of limited staffing during the pandemic.*     Medical Decision Making:  29 yo F here with palpitations and atypical CP. EKG is nonischemic. She was monitored on telemetry and does seem to have frequent, symptomatic PVCs. No hypotension, syncope, or high risk features. No appreciable murmur to suggest underlying valvular disease. No clinical signs of CHF. No high risk medication use. Otherwise, EKG intervals normal and no signs of AFib or malignant arrhythmia. She feels much better after PO lopressor.  Suspect symptomatic PVCs in setting of increased stressors, lack of sleep, smoking. Will have her reduce these, and given severity of sx we discussed having a PRN med, which is reasonable. Will start PO lopressor PRN, advise outpt follow-up. Risks/benefits discussed.  ____________________________________________  FINAL CLINICAL IMPRESSION(S) / ED DIAGNOSES  Final diagnoses:  Symptomatic PVCs     MEDICATIONS GIVEN DURING THIS VISIT:  Medications  metoprolol tartrate (LOPRESSOR) tablet 12.5 mg (12.5 mg Oral Given 03/29/19 1332)     ED Discharge Orders         Ordered    metoprolol tartrate (LOPRESSOR) 25 MG tablet  2 times daily PRN     03/29/19 1448           Note:  This document was prepared using Dragon voice recognition software and may include unintentional dictation errors.   Shaune Pollack, MD 03/29/19 1736

## 2019-03-29 NOTE — ED Triage Notes (Signed)
Pt reports pain is now gone but feels like her heart flutters intermittently.

## 2019-03-29 NOTE — ED Notes (Signed)
Pt states she feels better, less flutters, and appears less anxious

## 2019-03-29 NOTE — ED Notes (Signed)
Pt reports onset of symptoms same day as antibiotics start. Pt reports no antibiotics today, and relief of chest pain at approx 1000, with continuing feeling of heart flutters. Pt reports taking 600 mg ibuprofen at approx 0730.

## 2019-03-29 NOTE — ED Notes (Signed)
Pt is generally nervous/anxious

## 2019-04-02 ENCOUNTER — Ambulatory Visit: Payer: Self-pay | Admitting: Cardiology

## 2019-04-05 ENCOUNTER — Encounter: Payer: Self-pay | Admitting: Cardiology

## 2019-04-05 ENCOUNTER — Telehealth: Payer: Self-pay | Admitting: Cardiology

## 2019-04-05 NOTE — Telephone Encounter (Signed)
Made in error

## 2019-04-16 ENCOUNTER — Telehealth: Payer: Self-pay | Admitting: Nurse Practitioner

## 2019-04-16 DIAGNOSIS — N898 Other specified noninflammatory disorders of vagina: Secondary | ICD-10-CM

## 2019-04-16 MED ORDER — FLUCONAZOLE 150 MG PO TABS: 150.0000 mg | ORAL_TABLET | Freq: Once | ORAL | 0 refills | Status: AC

## 2019-04-16 NOTE — Progress Notes (Signed)
We are sorry that you are not feeling well. Here is how we plan to help! Based on what you shared with me it looks like you: May have a yeast vaginosis. Since you are having itching we will treat as a yeast infection. I medication given does not clear you up by Monday then you will need a face to face visit.  Vaginosis is an inflammation of the vagina that can result in discharge, itching and pain. The cause is usually a change in the normal balance of vaginal bacteria or an infection. Vaginosis can also result from reduced estrogen levels after menopause.  The most common causes of vaginosis are:   Bacterial vaginosis which results from an overgrowth of one on several organisms that are normally present in your vagina.   Yeast infections which are caused by a naturally occurring fungus called candida.   Vaginal atrophy (atrophic vaginosis) which results from the thinning of the vagina from reduced estrogen levels after menopause.   Trichomoniasis which is caused by a parasite and is commonly transmitted by sexual intercourse.  Factors that increase your risk of developing vaginosis include: Marland Kitchen Medications, such as antibiotics and steroids . Uncontrolled diabetes . Use of hygiene products such as bubble bath, vaginal spray or vaginal deodorant . Douching . Wearing damp or tight-fitting clothing . Using an intrauterine device (IUD) for birth control . Hormonal changes, such as those associated with pregnancy, birth control pills or menopause . Sexual activity . Having a sexually transmitted infection  Your treatment plan is A single Diflucan (fluconazole) 150mg  tablet once.  I have electronically sent this prescription into the pharmacy that you have chosen.  Be sure to take all of the medication as directed. Stop taking any medication if you develop a rash, tongue swelling or shortness of breath. Mothers who are breast feeding should consider pumping and discarding their breast milk while  on these antibiotics. However, there is no consensus that infant exposure at these doses would be harmful.  Remember that medication creams can weaken latex condoms.   HOME CARE:  Good hygiene may prevent some types of vaginosis from recurring and may relieve some symptoms:  . Avoid baths, hot tubs and whirlpool spas. Rinse soap from your outer genital area after a shower, and dry the area well to prevent irritation. Don't use scented or harsh soaps, such as those with deodorant or antibacterial action. Marland Kitchen Avoid irritants. These include scented tampons and pads. . Wipe from front to back after using the toilet. Doing so avoids spreading fecal bacteria to your vagina.  Other things that may help prevent vaginosis include:  Marland Kitchen Don't douche. Your vagina doesn't require cleansing other than normal bathing. Repetitive douching disrupts the normal organisms that reside in the vagina and can actually increase your risk of vaginal infection. Douching won't clear up a vaginal infection. . Use a latex condom. Both female and female latex condoms may help you avoid infections spread by sexual contact. . Wear cotton underwear. Also wear pantyhose with a cotton crotch. If you feel comfortable without it, skip wearing underwear to bed. Yeast thrives in Marland Kitchen Your symptoms should improve in the next day or two.  GET HELP RIGHT AWAY IF:  . You have pain in your lower abdomen ( pelvic area or over your ovaries) . You develop nausea or vomiting . You develop a fever . Your discharge changes or worsens . You have persistent pain with intercourse . You develop shortness of breath, a  rapid pulse, or you faint.  These symptoms could be signs of problems or infections that need to be evaluated by a medical provider now.  MAKE SURE YOU    Understand these instructions.  Will watch your condition.  Will get help right away if you are not doing well or get worse.  Your e-visit answers were  reviewed by a board certified advanced clinical practitioner to complete your personal care plan. Depending upon the condition, your plan could have included both over the counter or prescription medications. Please review your pharmacy choice to make sure that you have choses a pharmacy that is open for you to pick up any needed prescription, Your safety is important to [place]. If you have drug allergies check your prescription carefully.   You can use MyChart to ask questions about today's visit, request a non-urgent call back, or ask for a work or school excuse for 24 hours related to this e-Visit. If it has been greater than 24 hours you will need to follow up with your provider, or enter a new e-Visit to address those concerns. You will get a MyChart message within the next two days asking about your experience. I hope that your e-visit has been valuable and will speed your recovery.  5-10 minutes spent reviewing and documenting in chart.

## 2019-05-28 NOTE — Telephone Encounter (Signed)
Note signed. Jossie Ng, RN

## 2019-06-01 ENCOUNTER — Telehealth: Payer: Self-pay | Admitting: Physician Assistant

## 2019-06-01 DIAGNOSIS — R399 Unspecified symptoms and signs involving the genitourinary system: Secondary | ICD-10-CM

## 2019-06-01 MED ORDER — SULFAMETHOXAZOLE-TRIMETHOPRIM 800-160 MG PO TABS: 1.0000 | ORAL_TABLET | Freq: Two times a day (BID) | ORAL | 0 refills | Status: DC

## 2019-06-01 NOTE — Progress Notes (Signed)
Hi Jasmine Gray,   I see you have had five encounters for UTI-like symptoms in the past year - this is your sixth. You have been advised 2 out of those previous 5 times to see your Primary Care Provider for evaluation/follow-up of this problem.  E-visits cannot take the place of good, routine or follow-up care from a PCP.    There are other conditions that can cause pain with urination besides UTI's and I would hate for [place] to be misdiagnosing you and, therefore, treating you inappropriately with antibiotics.  You need to provide a urine sample AND a urine culture in order to determine if this is a true UTI.   [facility] offers a free physician referral service available at [phone]. Our trained staff has the experience, knowledge and resources to put you in touch with a physician who is right for you.   I will treat you today, but please go ahead and schedule an appointment to establish care with a PCP. You may need a referral to urology.   Most cases of urinary tract infections are simple to treat but a key part of your care is to encourage you to drink plenty of fluids and watch your symptoms carefully.  I have prescribed Bactrim DS One tablet twice a day for 5 days.  Your symptoms should gradually improve. Call us if the burning in your urine worsens, you develop worsening fever, back pain or pelvic pain or if your symptoms do not resolve after completing the antibiotic.  Urinary tract infections can be prevented by drinking plenty of water to keep your body hydrated.  Also be sure when you wipe, wipe from front to back and don't hold it in!  If possible, empty your bladder every 4 hours.  Your e-visit answers were reviewed by a board certified advanced clinical practitioner to complete your personal care plan.  Depending on the condition, your plan could have included both over the counter or prescription medications.  If there is a problem please reply  once you have received a response from  your provider.  Your safety is important to [place].  If you have drug allergies check your prescription carefully.    You can use MyChart to ask questions about today's visit, request a non-urgent call back, or ask for a work or school excuse for 24 hours related to this e-Visit. If it has been greater than 24 hours you will need to follow up with your provider, or enter a new e-Visit to address those concerns.   You will get an e-mail in the next two days asking about your experience.  I hope that your e-visit has been valuable and will speed your recovery. Thank you for using e-visits.   Greater than 5 minutes, yet less than 10 minutes of time have been spent researching, coordinating and implementing care for this patient today.

## 2019-07-08 ENCOUNTER — Other Ambulatory Visit: Payer: Self-pay

## 2019-07-08 ENCOUNTER — Ambulatory Visit: Payer: Self-pay | Admitting: Adult Health

## 2019-07-08 ENCOUNTER — Ambulatory Visit (LOCAL_COMMUNITY_HEALTH_CENTER): Payer: Medicaid Other

## 2019-07-08 VITALS — BP 111/70 | Ht 68.0 in | Wt 214.5 lb

## 2019-07-08 DIAGNOSIS — Z3009 Encounter for other general counseling and advice on contraception: Secondary | ICD-10-CM

## 2019-07-08 DIAGNOSIS — Z3042 Encounter for surveillance of injectable contraceptive: Secondary | ICD-10-CM

## 2019-07-08 DIAGNOSIS — Z30013 Encounter for initial prescription of injectable contraceptive: Secondary | ICD-10-CM

## 2019-07-08 NOTE — Progress Notes (Signed)
Pt is 16.2 weeks post depo today. Pt states she had negative home UPT about 2 weeks after her 03/16/19 visit. DMPA 150 mg IM administered per Samara Snide, PA order dated 03/16/19 (1 year depo order).

## 2019-07-17 ENCOUNTER — Telehealth: Payer: Medicaid Other | Admitting: Physician Assistant

## 2019-07-17 DIAGNOSIS — R12 Heartburn: Secondary | ICD-10-CM | POA: Diagnosis not present

## 2019-07-17 DIAGNOSIS — R079 Chest pain, unspecified: Secondary | ICD-10-CM

## 2019-07-17 MED ORDER — OMEPRAZOLE 20 MG PO CPDR: 20.0000 mg | DELAYED_RELEASE_CAPSULE | Freq: Every day | ORAL | 0 refills | Status: DC

## 2019-07-17 NOTE — Progress Notes (Signed)
Hi Jasmine Gray,   I am sorry that you are not feeling well.  I see that you have been evaluated for chest pain in the past. Considering that your pain radiates to your back, I would feel more comfortable if you were evaluated in person for this problem if the following treatment plan does not work.     If you do not have a PCP, [facility] offers a free physician referral service available at [phone]. Our trained staff has the experience, knowledge and resources to put you in touch with a physician who is right for you.   Based on what you shared with me it looks like you most likely have Gastroesophageal Reflux Disease (GERD)  Gastroesophageal reflux disease (GERD) happens when acid from your stomach flows up into the esophagus.  When acid comes in contact with the esophagus, the acid causes sorenss (inflammation) in the esophagus.  Over time, GERD may create small holes (ulcers) in the lining of the esophagus.  I have prescribed Omeprazole 20 mg one by mouth daily until you follow up with a provider.  Your symptoms should improve in the next day or two.  You can use antacids as needed until symptoms resolve.  Call us if your heartburn worsens, you have trouble swallowing, weight loss, spitting up blood or recurrent vomiting.  Home Care:  May include lifestyle changes such as weight loss, quitting smoking and alcohol consumption  Avoid foods and drinks that make your symptoms worse, such as:  Caffeine or alcoholic drinks  Chocolate  Peppermint or mint flavorings  Garlic and onions  Spicy foods  Citrus fruits, such as oranges, lemons, or limes  Tomato-based foods such as sauce, chili, salsa and pizza  Fried and fatty foods  Avoid lying down for 3 hours prior to your bedtime or prior to taking a nap  Eat small, frequent meals instead of a large meals  Wear loose-fitting clothing.  Do not wear anything tight around your waist that causes pressure on your stomach.  Raise the  head of your bed 6 to 8 inches with wood blocks to help you sleep.  Extra pillows will not help.  Seek Help Right Away If:  You have pain in your arms, neck, jaw, teeth or back  Your pain increases or changes in intensity or duration  You develop nausea, vomiting or sweating (diaphoresis)  You develop shortness of breath or you faint  Your vomit is green, yellow, black or looks like coffee grounds or blood  Your stool is red, bloody or black  These symptoms could be signs of other problems, such as heart disease, gastric bleeding or esophageal bleeding.  Make sure you :  Understand these instructions.  Will watch your condition.  Will get help right away if you are not doing well or get worse.  Your e-visit answers were reviewed by a board certified advanced clinical practitioner to complete your personal care plan.  Depending on the condition, your plan could have included both over the counter or prescription medications.  If there is a problem please reply  once you have received a response from your provider.  Your safety is important to [place].  If you have drug allergies check your prescription carefully.    You can use MyChart to ask questions about today's visit, request a non-urgent call back, or ask for a work or school excuse for 24 hours related to this e-Visit. If it has been greater than 24 hours you will need to  follow up with your provider, or enter a new e-Visit to address those concerns.  You will get an e-mail in the next two days asking about your experience.  I hope that your e-visit has been valuable and will speed your recovery. Thank you for using e-visits.   Greater than 5 minutes, yet less than 10 minutes of time have been spent researching, coordinating and implementing care for this patient today.

## 2019-08-18 ENCOUNTER — Telehealth: Payer: Medicaid Other | Admitting: Family

## 2019-08-18 ENCOUNTER — Telehealth: Payer: Medicaid Other

## 2019-08-18 DIAGNOSIS — B3731 Acute candidiasis of vulva and vagina: Secondary | ICD-10-CM

## 2019-08-18 DIAGNOSIS — B373 Candidiasis of vulva and vagina: Secondary | ICD-10-CM | POA: Diagnosis not present

## 2019-08-18 MED ORDER — FLUCONAZOLE 150 MG PO TABS: 150.0000 mg | ORAL_TABLET | ORAL | 0 refills | Status: DC | PRN

## 2019-08-18 NOTE — Progress Notes (Signed)
We are sorry that you are not feeling well. Here is how we plan to help! Based on what you shared with me it looks like you: May have a yeast vaginosis  Vaginosis is an inflammation of the vagina that can result in discharge, itching and pain. The cause is usually a change in the normal balance of vaginal bacteria or an infection. Vaginosis can also result from reduced estrogen levels after menopause.  The most common causes of vaginosis are:   Bacterial vaginosis which results from an overgrowth of one on several organisms that are normally present in your vagina.   Yeast infections which are caused by a naturally occurring fungus called candida.   Vaginal atrophy (atrophic vaginosis) which results from the thinning of the vagina from reduced estrogen levels after menopause.   Trichomoniasis which is caused by a parasite and is commonly transmitted by sexual intercourse.  Factors that increase your risk of developing vaginosis include: Marland Kitchen Medications, such as antibiotics and steroids . Uncontrolled diabetes . Use of hygiene products such as bubble bath, vaginal spray or vaginal deodorant . Douching . Wearing damp or tight-fitting clothing . Using an intrauterine device (IUD) for birth control . Hormonal changes, such as those associated with pregnancy, birth control pills or menopause . Sexual activity . Having a sexually transmitted infection  Your treatment plan is A single Diflucan (fluconazole) 150mg  tablet once.  I have electronically sent this prescription into the pharmacy that you have chosen. You can use monistat over the counter first and if your symptoms worsen or do not improve you can start the diflucan.   Be sure to take all of the medication as directed. Stop taking any medication if you develop a rash, tongue swelling or shortness of breath. Mothers who are breast feeding should consider pumping and discarding their breast milk while on these antibiotics. However, there is  no consensus that infant exposure at these doses would be harmful.  Remember that medication creams can weaken latex condoms.   HOME CARE:  Good hygiene may prevent some types of vaginosis from recurring and may relieve some symptoms:  . Avoid baths, hot tubs and whirlpool spas. Rinse soap from your outer genital area after a shower, and dry the area well to prevent irritation. Don't use scented or harsh soaps, such as those with deodorant or antibacterial action. Marland Kitchen Avoid irritants. These include scented tampons and pads. . Wipe from front to back after using the toilet. Doing so avoids spreading fecal bacteria to your vagina.  Other things that may help prevent vaginosis include:  Marland Kitchen Don't douche. Your vagina doesn't require cleansing other than normal bathing. Repetitive douching disrupts the normal organisms that reside in the vagina and can actually increase your risk of vaginal infection. Douching won't clear up a vaginal infection. . Use a latex condom. Both female and female latex condoms may help you avoid infections spread by sexual contact. . Wear cotton underwear. Also wear pantyhose with a cotton crotch. If you feel comfortable without it, skip wearing underwear to bed. Yeast thrives in Marland Kitchen Your symptoms should improve in the next day or two.  GET HELP RIGHT AWAY IF:  . You have pain in your lower abdomen ( pelvic area or over your ovaries) . You develop nausea or vomiting . You develop a fever . Your discharge changes or worsens . You have persistent pain with intercourse . You develop shortness of breath, a rapid pulse, or you faint.  These symptoms  could be signs of problems or infections that need to be evaluated by a medical provider now.  MAKE SURE YOU    Understand these instructions.  Will watch your condition.  Will get help right away if you are not doing well or get worse.  Your e-visit answers were reviewed by a board certified advanced  clinical practitioner to complete your personal care plan. Depending upon the condition, your plan could have included both over the counter or prescription medications. Please review your pharmacy choice to make sure that you have choses a pharmacy that is open for you to pick up any needed prescription, Your safety is important to [place]. If you have drug allergies check your prescription carefully.   You can use MyChart to ask questions about today's visit, request a non-urgent call back, or ask for a work or school excuse for 24 hours related to this e-Visit. If it has been greater than 24 hours you will need to follow up with your provider, or enter a new e-Visit to address those concerns. You will get a MyChart message within the next two days asking about your experience. I hope that your e-visit has been valuable and will speed your recovery.  Approximately 5 minutes was spent documenting and reviewing patient's chart.

## 2019-11-23 ENCOUNTER — Telehealth: Payer: Medicaid Other | Admitting: Physician Assistant

## 2019-11-23 DIAGNOSIS — U071 COVID-19: Secondary | ICD-10-CM | POA: Diagnosis not present

## 2019-11-23 MED ORDER — BENZONATATE 100 MG PO CAPS: 100.0000 mg | ORAL_CAPSULE | Freq: Three times a day (TID) | ORAL | 0 refills | Status: AC

## 2019-11-23 MED ORDER — ALBUTEROL SULFATE HFA 108 (90 BASE) MCG/ACT IN AERS: 2.0000 | INHALATION_SPRAY | Freq: Four times a day (QID) | RESPIRATORY_TRACT | 0 refills | Status: DC | PRN

## 2019-11-23 NOTE — Progress Notes (Signed)
E-Visit for Corona Virus Screening  We are sorry you are not feeling well. We are here to help!   You have been enrolled in MyChart Home Monitoring for COVID-19. Daily you will receive a questionnaire within the MyChart website. Our COVID-19 response team will be monitoring your responses daily.  Please continue isolation at home, for at least 10 days since the start of your symptoms and until you have had 24 hours with no fever (without taking a fever reducer) and with improving of symptoms.  Please continue good preventive care measures, including:  frequent hand-washing, avoid touching your face, cover coughs/sneezes, stay out of crowds and keep a 6 foot distance from others.  Follow up with your provider or go to the nearest hospital ED for re-assessment if fever/cough/breathlessness return.  The following symptoms may appear 2-14 days after exposure:  Fever  Cough  Shortness of breath or difficulty breathing  Chills  Repeated shaking with chills  Muscle pain  Headache  Sore throat  New loss of taste or smell  Fatigue  Congestion or runny nose  Nausea or vomiting  Diarrhea  Go to the nearest hospital ED for assessment if fever/cough/breathlessness are severe or illness seems like a threat to life.  It is vitally important that if you feel that you have an infection such as this virus or any other virus that you stay home and away from places where you may spread it to others.  You should avoid contact with people age 28 and older.   You can use medication such as A prescription cough medication called Tessalon Perles 100 mg. You may take 1-2 capsules every 8 hours as needed for cough and A prescription inhaler called Albuterol MDI 90 mcg /actuation 2 puffs every 4 hours as needed for shortness of breath, wheezing, cough  You may also take acetaminophen (Tylenol) as needed for fever.  Reduce your risk of any infection by using the same precautions used for avoiding the  common cold or flu:   Wash your hands often with soap and warm water for at least 20 seconds.  If soap and water are not readily available, use an alcohol-based hand sanitizer with at least 60% alcohol.   If coughing or sneezing, cover your mouth and nose by coughing or sneezing into the elbow areas of your shirt or coat, into a tissue or into your sleeve (not your hands).  Avoid shaking hands with others and consider head nods or verbal greetings only.  Avoid touching your eyes, nose, or mouth with unwashed hands.   Avoid close contact with people who are sick.  Avoid places or events with large numbers of people in one location, like concerts or sporting events.  Carefully consider travel plans you have or are making.  If you are planning any travel outside or inside the [place], visit the CDC's Travelers' Health webpage for the latest health notices.  If you have some symptoms but not all symptoms, continue to monitor at home and seek medical attention if your symptoms worsen.  If you are having a medical emergency, call 911.  HOME CARE  Only take medications as instructed by your medical team.  Drink plenty of fluids and get plenty of rest.  A steam or ultrasonic humidifier can help if you have congestion.   GET HELP RIGHT AWAY IF YOU HAVE EMERGENCY WARNING SIGNS** FOR COVID-19. If you or someone is showing any of these signs seek emergency medical care immediately. Call 911 or proceed  to your closest emergency facility if:  You develop worsening high fever.  Trouble breathing  Bluish lips or face  Persistent pain or pressure in the chest  New confusion  Inability to wake or stay awake  You cough up blood.  Your symptoms become more severe  **This list is not all possible symptoms. Contact your medical provider for any symptoms that are sever or concerning to you.  MAKE SURE YOU   Understand these instructions.  Will watch your condition.  Will get help right  away if you are not doing well or get worse.  Your e-visit answers were reviewed by a board certified advanced clinical practitioner to complete your personal care plan.  Depending on the condition, your plan could have included both over the counter or prescription medications.  If there is a problem please reply once you have received a response from your provider.  Your safety is important to [place].  If you have drug allergies check your prescription carefully.    You can use MyChart to ask questions about today's visit, request a non-urgent call back, or ask for a work or school excuse for 24 hours related to this e-Visit. If it has been greater than 24 hours you will need to follow up with your provider, or enter a new e-Visit to address those concerns. You will get an e-mail in the next two days asking about your experience.  I hope that your e-visit has been valuable and will speed your recovery. Thank you for using e-visits.   Approximately 5 minutes was spent documenting and reviewing patient's chart.

## 2019-11-25 ENCOUNTER — Telehealth (HOSPITAL_COMMUNITY): Payer: Self-pay | Admitting: Family

## 2019-11-25 ENCOUNTER — Telehealth: Payer: Self-pay | Admitting: Physician Assistant

## 2019-11-25 ENCOUNTER — Telehealth: Payer: Self-pay | Admitting: Nurse Practitioner

## 2019-11-25 DIAGNOSIS — R69 Illness, unspecified: Secondary | ICD-10-CM

## 2019-11-25 DIAGNOSIS — U071 COVID-19: Secondary | ICD-10-CM

## 2019-11-25 NOTE — Telephone Encounter (Signed)
My chart message sent asking patient to contact hotline number as we've been unable to reach numerous times by phone.

## 2019-11-25 NOTE — Telephone Encounter (Signed)
Attempted to call  Angie Fava about Covid symptoms and potential candidacy for the use of casirivimab/imdevimab, a combination monoclonal antibody infusion for those with mild to moderate Covid symptoms and at a high risk of hospitalization.    Per EMR notes and chats, interest was shown in MAB infusion. Multiple unsuccessful  attempts have been made to reach her to set this up. Attempt made at this time, but phone went straight to VM.     Joell Buerger,NP

## 2019-11-25 NOTE — Telephone Encounter (Addendum)
Called to discuss with patient about Covid symptoms and the use of bamlanivimab/etesevimab or casirivimab/imdevimab, a monoclonal antibody infusion for those with mild to moderate Covid symptoms and at a high risk of hospitalization.  Pt is qualified for this infusion at the [facility] due to; Specific high risk criteria : BMI > 25 and Other high risk medical condition per CDC:  tobacco abuse and high SVI. Per referral notes, sx onset 10/14.  Message left to call back our hotline [phone].  Cline Crock PA-C  [facility]

## 2019-11-25 NOTE — Telephone Encounter (Signed)
Called to Discuss with patient about Covid symptoms and the use of monoclonal antibody infusions for those with mild to moderate Covid symptoms and at a high risk of hospitalization.     Pt is qualified for this infusion at the [facility] due to co-morbid conditions and/or a member of an at-risk group.    Patient previously responded to [employer]. Unable to reach patient by phone. Left message to return call.   Consuello Masse, DNP, AGNP-C [phone] (Infusion Center Hotline)

## 2020-01-13 ENCOUNTER — Emergency Department
Admission: EM | Admit: 2020-01-13 | Discharge: 2020-01-13 | Disposition: A | Discharge status: Home / Self Care | Payer: Medicaid Other | Attending: Emergency Medicine | Admitting: Emergency Medicine

## 2020-01-13 ENCOUNTER — Other Ambulatory Visit: Payer: Self-pay

## 2020-01-13 ENCOUNTER — Encounter: Payer: Self-pay | Admitting: Emergency Medicine

## 2020-01-13 ENCOUNTER — Emergency Department: Payer: Medicaid Other

## 2020-01-13 DIAGNOSIS — X500XXA Overexertion from strenuous movement or load, initial encounter: Secondary | ICD-10-CM | POA: Diagnosis not present

## 2020-01-13 DIAGNOSIS — F17210 Nicotine dependence, cigarettes, uncomplicated: Secondary | ICD-10-CM | POA: Diagnosis not present

## 2020-01-13 DIAGNOSIS — S46912A Strain of unspecified muscle, fascia and tendon at shoulder and upper arm level, left arm, initial encounter: Secondary | ICD-10-CM | POA: Diagnosis not present

## 2020-01-13 DIAGNOSIS — R52 Pain, unspecified: Secondary | ICD-10-CM

## 2020-01-13 DIAGNOSIS — Y990 Civilian activity done for income or pay: Secondary | ICD-10-CM | POA: Insufficient documentation

## 2020-01-13 DIAGNOSIS — M542 Cervicalgia: Secondary | ICD-10-CM | POA: Insufficient documentation

## 2020-01-13 DIAGNOSIS — S4992XA Unspecified injury of left shoulder and upper arm, initial encounter: Secondary | ICD-10-CM | POA: Diagnosis present

## 2020-01-13 MED ORDER — TRAMADOL HCL 50 MG PO TABS: 50.0000 mg | ORAL_TABLET | Freq: Four times a day (QID) | ORAL | 0 refills | Status: DC | PRN

## 2020-01-13 MED ORDER — LIDOCAINE 5 % EX PTCH
1.0000 | MEDICATED_PATCH | CUTANEOUS | Status: DC
  Administered 2020-01-13: 1 via TRANSDERMAL
  Filled 2020-01-13: qty 1

## 2020-01-13 MED ORDER — IBUPROFEN 600 MG PO TABS: 600.0000 mg | ORAL_TABLET | Freq: Four times a day (QID) | ORAL | 0 refills | Status: DC | PRN

## 2020-01-13 MED ORDER — CYCLOBENZAPRINE HCL 10 MG PO TABS: 10.0000 mg | ORAL_TABLET | Freq: Three times a day (TID) | ORAL | 0 refills | Status: DC | PRN

## 2020-01-13 NOTE — ED Provider Notes (Signed)
[facility] Emergency Department Provider Note   ____________________________________________   Event Date/Time   First MD Initiated Contact with Patient 01/13/20 1049     (approximate)  I have reviewed the triage vital signs and the nursing notes.   HISTORY  Chief Complaint Shoulder Injury    HPI Jasmine Gray is a 29 y.o. female patient complain of left lateral neck and left shoulder pain secondary to lifting incident at work yesterday.  Patient state pain immediately after lifting a heavy TV.  Patient denies loss of sensation.  Patient state decreased range of motion with abduction and overhead reaching.  Rates pain a 7/10.  Described pain as "burning".  No palliative measure for complaint.  Patient is right-hand dominant.         Past Medical History:  Diagnosis Date  . Acid reflux    on no RX at present  . Hemorrhoid 11/2015   starting on past Sat. Pt states it is on the outside  . Tobacco abuse     Patient Active Problem List   Diagnosis Date Noted  . History of premature delivery 04/29/2016  . Overweight (BMI 25.0-29.9) 07/28/2015  . Tobacco user 05/31/2014    Past Surgical History:  Procedure Laterality Date  . NO PAST SURGERIES      Prior to Admission medications   Medication Sig Start Date End Date Taking? Authorizing Provider  acetaminophen (TYLENOL) 500 MG tablet Take 500-1,000 mg by mouth every 6 (six) hours as needed for mild pain or fever.    [provider]  albuterol (VENTOLIN HFA) 108 (90 Base) MCG/ACT inhaler Inhale 2 puffs into the lungs every 6 (six) hours as needed for wheezing or shortness of breath. 11/23/19   Couture, Cortni S, PA-C  cephALEXin (KEFLEX) 500 MG capsule Take 1 capsule (500 mg total) by mouth 2 (two) times daily. Patient not taking: Reported on 07/08/2019 03/27/19   Bennie Pierini, FNP  cyclobenzaprine (FLEXERIL) 10 MG tablet Take 1 tablet (10 mg total) by mouth 3 (three) times daily as  needed. 01/13/20   Joni Reining, PA-C  fluconazole (DIFLUCAN) 150 MG tablet Take 1 tablet (150 mg total) by mouth every three (3) days as needed. 08/18/19   Jannifer Rodney A, FNP  ibuprofen (ADVIL) 200 MG tablet Take 200 mg by mouth every 6 (six) hours as needed for fever or mild pain.    [provider]  ibuprofen (ADVIL) 600 MG tablet Take 1 tablet (600 mg total) by mouth every 6 (six) hours as needed. 01/13/20   Joni Reining, PA-C  metoprolol tartrate (LOPRESSOR) 25 MG tablet Take 0.5 tablets (12.5 mg total) by mouth 2 (two) times daily as needed (Palpitations). Patient not taking: Reported on 07/08/2019 03/29/19   Shaune Pollack, MD  Multiple Vitamins-Minerals (MULTIVITAMIN WITH MINERALS) tablet Take 1 tablet by mouth daily. Patient not taking: Reported on 03/16/2019 08/18/18   Larene Pickett, FNP  Multiple Vitamins-Minerals (MULTIVITAMIN) tablet Take 1 tablet by mouth daily. Patient not taking: Reported on 07/08/2019 03/16/19   Samara Snide L, PA-C  nitrofurantoin, macrocrystal-monohydrate, (MACROBID) 100 MG capsule Take 1 capsule (100 mg total) by mouth 2 (two) times daily. 1 po BId Patient not taking: Reported on 07/08/2019 03/27/19   Bennie Pierini, FNP  omeprazole (PRILOSEC) 20 MG capsule Take 1 capsule (20 mg total) by mouth daily. 07/17/19   McVey, Madelaine Bhat, PA-C  sulfamethoxazole-trimethoprim (BACTRIM DS) 800-160 MG tablet Take 1 tablet by mouth 2 (two) times daily. Patient  not taking: Reported on 07/08/2019 03/27/19   Bennie Pierini, FNP  sulfamethoxazole-trimethoprim (BACTRIM DS) 800-160 MG tablet Take 1 tablet by mouth 2 (two) times daily. Patient not taking: Reported on 07/08/2019 06/01/19   McVey, Madelaine Bhat, PA-C  traMADol (ULTRAM) 50 MG tablet Take 1 tablet (50 mg total) by mouth every 6 (six) hours as needed. 01/13/20   Joni Reining, PA-C  fluticasone (FLONASE) 50 MCG/ACT nasal spray Place 2 sprays into both nostrils daily. Patient not taking:  Reported on 06/08/2018 04/30/18 12/06/18  Eulis Foster, FNP    Allergies Penicillins  Family History  Problem Relation Age of Onset  . Heart disease Father   . Hypertension Father   . Congestive Heart Failure Father   . Stroke Father   . Hypertension Mother   . Migraines Mother   . Cancer Maternal Grandmother   . Alcohol abuse Maternal Grandfather   . Depression Neg Hx     Social History Social History   Tobacco Use  . Smoking status: Current Every Day Smoker    Packs/day: 0.25    Years: 5.00    Pack years: 1.25    Types: Cigarettes  . Smokeless tobacco: Never Used  Vaping Use  . Vaping Use: Never used  Substance Use Topics  . Alcohol use: No  . Drug use: No    Review of Systems Constitutional: No fever/chills Eyes: No visual changes. ENT: No sore throat. Cardiovascular: Denies chest pain. Respiratory: Denies shortness of breath. Gastrointestinal: No abdominal pain.  No nausea, no vomiting.  No diarrhea.  No constipation. Genitourinary: Negative for dysuria. Musculoskeletal: Negative for back pain. Skin: Negative for rash. Neurological: Negative for headaches, focal weakness or numbness. Allergic/Immunilogical: Penicillin ____________________________________________   PHYSICAL EXAM:  VITAL SIGNS: ED Triage Vitals  Enc Vitals Group     BP 01/13/20 1005 128/78     Pulse Rate 01/13/20 1005 92     Resp 01/13/20 1005 16     Temp 01/13/20 1005 98.7 F (37.1 C)     Temp Source 01/13/20 1005 Oral     SpO2 01/13/20 1005 100 %     Weight 01/13/20 1005 200 lb (90.7 kg)     Height 01/13/20 1005 5\' 8"  (1.727 m)     Head Circumference --      Peak Flow --      Pain Score 01/13/20 1023 7     Pain Loc --      Pain Edu? --      Excl. in GC? --    Constitutional: Alert and oriented. Well appearing and in no acute distress. Hematological/Lymphatic/Immunilogical: No cervical lymphadenopathy. Cardiovascular: Normal rate, regular rhythm. Grossly normal heart sounds.   Good peripheral circulation. Respiratory: Normal respiratory effort.  No retractions. Lungs CTAB. Musculoskeletal: No obvious deformity to the left shoulder.  Patient is moderate guarding palpation GH joint and the proximal humerus.. Neurologic:  Normal speech and language. No gross focal neurologic deficits are appreciated. No gait instability. Skin:  Skin is warm, dry and intact. No rash noted. Psychiatric: Mood and affect are normal. Speech and behavior are normal.  ____________________________________________   LABS (all labs ordered are listed, but only abnormal results are displayed)  Labs Reviewed - No data to display ____________________________________________  EKG   ____________________________________________  RADIOLOGY I, 14/09/21, personally viewed and evaluated these images (plain radiographs) as part of my medical decision making, as well as reviewing the written report by the radiologist.  ED MD interpretation: No acute  findings on x-ray of the left shoulder. Official radiology report(s): DG Shoulder Left  Result Date: 01/13/2020 CLINICAL DATA:  Left shoulder pain after lifting heavy object yesterday. EXAM: LEFT SHOULDER - 2+ VIEW COMPARISON:  None. FINDINGS: There is no evidence of fracture or dislocation. There is no evidence of arthropathy or other focal bone abnormality. Soft tissues are unremarkable. IMPRESSION: Negative. Electronically Signed   By: Lupita Raider M.D.   On: 01/13/2020 11:45    ____________________________________________   PROCEDURES  Procedure(s) performed (including Critical Care):  Procedures   ____________________________________________   INITIAL IMPRESSION / ASSESSMENT AND PLAN / ED COURSE  As part of my medical decision making, I reviewed the following data within the electronic MEDICAL RECORD NUMBER         Patient presents with right shoulder pain secondary to lifting incident.  Discussed no acute findings on x-ray  of the left shoulder.  Patient complaining physical exam is consistent with shoulder strain.  Patient placed in arm sling and given discharge care instruction.  Take medication as directed.  Patient advised follow-up PCP.      ____________________________________________   FINAL CLINICAL IMPRESSION(S) / ED DIAGNOSES  Final diagnoses:  Strain of left shoulder, initial encounter     ED Discharge Orders         Ordered    cyclobenzaprine (FLEXERIL) 10 MG tablet  3 times daily PRN        01/13/20 1205    traMADol (ULTRAM) 50 MG tablet  Every 6 hours PRN        01/13/20 1205    ibuprofen (ADVIL) 600 MG tablet  Every 6 hours PRN        01/13/20 1205          *Please note:  Jasmine Gray was evaluated in Emergency Department on 01/13/2020 for the symptoms described in the history of present illness. She was evaluated in the context of the global COVID-19 pandemic, which necessitated consideration that the patient might be at risk for infection with the SARS-CoV-2 virus that causes COVID-19. Institutional protocols and algorithms that pertain to the evaluation of patients at risk for COVID-19 are in a state of rapid change based on information released by regulatory bodies including the CDC and federal and state organizations. These policies and algorithms were followed during the patient's care in the ED.  Some ED evaluations and interventions may be delayed as a result of limited staffing during and the pandemic.*   Note:  This document was prepared using Dragon voice recognition software and may include unintentional dictation errors.    Joni Reining, PA-C 01/13/20 1209    Dionne Bucy, MD 01/13/20 [phone]

## 2020-01-13 NOTE — Discharge Instructions (Signed)
No acute findings x-ray of the left shoulder.  Follow discharge care instruction Wear arm sling as directed.  Take medication as directed

## 2020-01-13 NOTE — ED Notes (Addendum)
Several attempts made to contact [facility] at [phone] for instructions. The phone was not answered in several different departments. Patient is trying to text her team lead, Regine Cotton for instructions. Worker's comp form states no UDS needed unless Production designer, theatre/television/film asks for it.

## 2020-01-13 NOTE — ED Notes (Signed)
[facility] drug screen ineligibilty form signed.

## 2020-01-13 NOTE — ED Triage Notes (Signed)
Pt via pov from home with left neck and shoulder pain after picking up a tv yesterday at work. Pt states it began shortly after picking up the TV. Pt describes pain as "pulling" and "burning." Pt alert & oriented, nad noted.

## 2020-01-13 NOTE — ED Notes (Signed)
Patient declined discharge vital signs. 

## 2020-01-26 ENCOUNTER — Other Ambulatory Visit: Payer: Self-pay

## 2020-01-26 ENCOUNTER — Ambulatory Visit (LOCAL_COMMUNITY_HEALTH_CENTER): Payer: Medicaid Other | Admitting: Physician Assistant

## 2020-01-26 VITALS — BP 130/84 | Ht 68.0 in | Wt 200.0 lb

## 2020-01-26 DIAGNOSIS — Z3009 Encounter for other general counseling and advice on contraception: Secondary | ICD-10-CM | POA: Diagnosis not present

## 2020-01-26 DIAGNOSIS — Z3042 Encounter for surveillance of injectable contraceptive: Secondary | ICD-10-CM | POA: Diagnosis not present

## 2020-01-26 NOTE — Progress Notes (Signed)
   [facility] problem [facility]  Subjective:  Jasmine Gray is a 29 y.o. being seen today to restart Depo.  Chief Complaint  Patient presents with  . Contraception    Patient here for depo    HPI Patient into clinic requesting to restart Depo.  States that she took a break so that she could have a period.  States that she has used other methods but has more moodiness and irregular bleeding on Nexplanon or IUD.  Would like to get Depo today.  Per patient, last sex was 01/19/2020 and last period started on 01/22/2020, and has been normal.  Does the patient have a current or past history of drug use? No   No components found for: HCV]   Health Maintenance Due  Topic Date Due  . Hepatitis C Screening  Never done  . COVID-19 Vaccine (1) Never done  . PAP SMEAR-Modifier  05/02/2018  . INFLUENZA VACCINE  09/05/2019    Review of Systems  All other systems reviewed and are negative.   The following portions of the patient's history were reviewed and updated as appropriate: allergies, current medications, past family history, past medical history, past social history, past surgical history and problem list. Problem list updated.   See flowsheet for other program required questions.  Objective:   Vitals:   01/26/20 0934 01/26/20 0939  BP: 139/90 130/84  Weight: 200 lb (90.7 kg)   Height: 5\' 8"  (1.727 m)     Physical Exam Vitals and nursing note reviewed.  Constitutional:      General: She is not in acute distress.    Appearance: Normal appearance.  HENT:     Head: Normocephalic and atraumatic.  Eyes:     Conjunctiva/sclera: Conjunctivae normal.  Pulmonary:     Effort: Pulmonary effort is normal.  Neurological:     Mental Status: She is alert and oriented to person, place, and time.  Psychiatric:        Mood and Affect: Mood normal.        Behavior: Behavior normal.        Thought Content: Thought content normal.         Judgment: Judgment normal.       Assessment and Plan:  Jasmine Gray is a 29 y.o. female presenting to the [facility] Department for a Women's Health problem visit  1. Encounter for counseling regarding contraception Counseled patient that it is normal to not have a period after Depo runs out and that the first one could be as early as a few days or as long as 18 months after the shot stops protecting you. Counseled that the reason there is usually no bleeding with Depo is that it prevents the lining from forming in the uterus. Enc to do a OTC pregnancy test in 2 weeks to confirm not pregnant.  RTC if pregnancy test is positive. Enc to use condoms with all sex for 2 weeks after shot today and always for STD protection.  2. Surveillance for Depo-Provera contraception OK for Depo today per 03/2019 order. Counseled patient that she will be due for RP with CBE and pap when next Depo is due.     No follow-ups on file.  No future appointments.  04/2019, PA

## 2020-01-26 NOTE — Progress Notes (Signed)
Patient here for depo. Last depo 07/08/2019. Harvie Heck, RN   Post:  RN admin Depo 150 mg IM. Patient tolerated well. RN gave patient depo return date and instructed to schedule for physical and depo. RN talked to patient about getting covid vaccine. Patient is going to call and schedule appointment.   Harvie Heck, RN

## 2020-02-08 ENCOUNTER — Telehealth: Payer: Medicaid Other | Admitting: Emergency Medicine

## 2020-02-08 DIAGNOSIS — J069 Acute upper respiratory infection, unspecified: Secondary | ICD-10-CM | POA: Diagnosis not present

## 2020-02-08 MED ORDER — BENZONATATE 100 MG PO CAPS: 100.0000 mg | ORAL_CAPSULE | Freq: Two times a day (BID) | ORAL | 0 refills | Status: DC | PRN

## 2020-02-08 MED ORDER — FLUTICASONE PROPIONATE 50 MCG/ACT NA SUSP: 2.0000 | Freq: Every day | NASAL | 0 refills | Status: DC

## 2020-02-08 NOTE — Progress Notes (Signed)

## 2020-02-14 ENCOUNTER — Telehealth: Payer: Medicaid Other | Admitting: Family

## 2020-02-14 DIAGNOSIS — U071 COVID-19: Secondary | ICD-10-CM

## 2020-02-14 MED ORDER — FLUTICASONE PROPIONATE 50 MCG/ACT NA SUSP: 2.0000 | Freq: Every day | NASAL | 6 refills | Status: DC

## 2020-02-14 MED ORDER — BENZONATATE 100 MG PO CAPS: 100.0000 mg | ORAL_CAPSULE | Freq: Three times a day (TID) | ORAL | 0 refills | Status: DC | PRN

## 2020-02-14 MED ORDER — ALBUTEROL SULFATE HFA 108 (90 BASE) MCG/ACT IN AERS: 2.0000 | INHALATION_SPRAY | Freq: Four times a day (QID) | RESPIRATORY_TRACT | 0 refills | Status: DC | PRN

## 2020-02-14 MED ORDER — DEXAMETHASONE 6 MG PO TABS: 6.0000 mg | ORAL_TABLET | Freq: Two times a day (BID) | ORAL | 0 refills | Status: DC

## 2020-02-14 NOTE — Progress Notes (Signed)
E-Visit for Corona Virus Screening  Your current symptoms could be consistent with the coronavirus.  Many health care providers can now test patients at their office but not all are.  [facility] has multiple testing sites. For information on our COVID testing locations and hours go to [email or web address]  We are enrolling you in our MyChart Home Monitoring for COVID19 . Daily you will receive a questionnaire within the MyChart website. Our COVID 19 response team will be monitoring your responses daily.  Testing Information: The COVID-19 Community Testing sites are testing BY APPOINTMENT ONLY.  You can schedule online at [email or web address]  If you do not have access to a smart phone or computer you may call [phone] for an appointment.   Additional testing sites in the Community:  . For [facility] Testing sites in [facility]  [email or web address]  . For Pop-up testing sites in [place]  [email or web address]  . For [facility] [email or web address]  . For [facility] testing in [place] and [employer] [email or web address]  . For [facility] testing in [facility]   [email or web address]  For  more information about community testing call [phone]   Please quarantine yourself while awaiting your test results. Please stay home for a minimum of 10 days from the first day of illness with improving symptoms and you have had 24 hours of no fever (without the use of Tylenol (Acetaminophen) Motrin (Ibuprofen) or any fever reducing medication).  Also - Do not get tested prior to returning to work because once you have had a positive test the test can stay  positive for more than a month in some cases.   You should wear a mask or cloth face covering over your nose and mouth if you must be around other people or animals, including pets (even at home). Try to stay at least 6 feet away from other people. This will protect the people around you.  Please continue good preventive care measures, including:  frequent hand-washing, avoid touching your face, cover coughs/sneezes, stay out of crowds and keep a 6 foot distance from others.  COVID-19 is a respiratory illness with symptoms that are similar to the flu. Symptoms are typically mild to moderate, but there have been cases of severe illness and death due to the virus.   The following symptoms may appear 2-14 days after exposure: . Fever . Cough . Shortness of breath or difficulty breathing . Chills . Repeated shaking with chills . Muscle pain . Headache . Sore throat . New loss of taste or smell . Fatigue . Congestion or runny nose . Nausea or vomiting . Diarrhea  Go to the nearest hospital ED for assessment if fever/cough/breathlessness are severe or illness seems like a threat to life.  It is vitally important that if you feel that you have an infection such as this virus or any other virus that you stay home and away from places where you may spread it to others.  You should avoid contact with people age 29 and older.   You can use medication such as A prescription cough medication called Tessalon Perles 100 mg. You may take 1-2 capsules every 8 hours as needed for cough, A prescription inhaler called Albuterol MDI 90 mcg /actuation 2 puffs every 4 hours as needed for shortness of breath, wheezing, cough and A prescription for Fluticasone nasal spray 2 sprays in each nostril one time per day and dexamethasone 6 mg for twice a day for 7 days.   You may also  take acetaminophen (Tylenol) as needed for fever.  Reduce your risk of any infection by using the same precautions used for avoiding the  common cold or flu:  Marland Kitchen Wash your hands often with soap and warm water for at least 20 seconds.  If soap and water are not readily available, use an alcohol-based hand sanitizer with at least 60% alcohol.  . If coughing or sneezing, cover your mouth and nose by coughing or sneezing into the elbow areas of your shirt or coat, into a tissue or into your sleeve (not your hands). . Avoid shaking hands with others and consider head nods or verbal greetings only. . Avoid touching your eyes, nose, or mouth with unwashed hands.  . Avoid close contact with people who are sick. . Avoid places or events with large numbers of people in one location, like concerts or sporting events. . Carefully consider travel plans you have or are making. . If you are planning any travel outside or inside the [place], visit the CDC's Travelers' Health webpage for the latest health notices. . If you have some symptoms but not all symptoms, continue to monitor at home and seek medical attention if your symptoms worsen. . If you are having a medical emergency, call 911.  HOME CARE . Only take medications as instructed by your medical team. . Drink plenty of fluids and get plenty of rest. . A steam or ultrasonic humidifier can help if you have congestion.   GET HELP RIGHT AWAY IF YOU HAVE EMERGENCY WARNING SIGNS** FOR COVID-19. If you or someone is showing any of these signs seek emergency medical care immediately. Call 911 or proceed to your closest emergency facility if: . You develop worsening high fever. . Trouble breathing . Bluish lips or face . Persistent pain or pressure in the chest . New confusion . Inability to wake or stay awake . You cough up blood. . Your symptoms become more severe  **This list is not all possible symptoms. Contact your medical provider for any symptoms that are sever or concerning to you.  MAKE SURE YOU   Understand these instructions.  Will watch your condition.  Will get help right  away if you are not doing well or get worse.  Your e-visit answers were reviewed by a board certified advanced clinical practitioner to complete your personal care plan.  Depending on the condition, your plan could have included both over the counter or prescription medications.  If there is a problem please reply once you have received a response from your provider.  Your safety is important to [place].  If you have drug allergies check your prescription carefully.    You can use MyChart to ask questions about today's visit, request a non-urgent call back, or ask for a work or school excuse for 24 hours related to this e-Visit. If it has been greater than 24 hours you will need to follow up with your provider, or enter a new e-Visit to address those concerns. You will get an e-mail in the next two days asking about your experience.  I hope that your e-visit has been valuable and will speed your recovery. Thank you for using e-visits.   Approximately 5 minutes was spent documenting and reviewing patient's chart.

## 2020-03-31 ENCOUNTER — Telehealth: Payer: Medicaid Other | Admitting: Nurse Practitioner

## 2020-03-31 DIAGNOSIS — R197 Diarrhea, unspecified: Secondary | ICD-10-CM

## 2020-03-31 NOTE — Progress Notes (Signed)
We are sorry that you are not feeling well.  Here is how we plan to help!  Based on what you have shared with me it looks like you have Acute Infectious Diarrhea.  Most cases of acute diarrhea are due to infections with virus and bacteria and are self-limited conditions lasting less than 14 days.  For your symptoms you may take Imodium 2 mg tablets that are over the counter at your local pharmacy. Take two tablet now and then one after each loose stool up to 6 a day.  Antibiotics are not needed for most people with diarrhea.   HOME CARE  We recommend changing your diet to help with your symptoms for the next few days.  Drink plenty of fluids that contain water salt and sugar. Sports drinks such as Gatorade may help.   You may try broths, soups, bananas, applesauce, soft breads, mashed potatoes or crackers.   You are considered infectious for as long as the diarrhea continues. Hand washing or use of alcohol based hand sanitizers is recommend.  It is best to stay out of work or school until your symptoms stop.   GET HELP RIGHT AWAY  If you have dark yellow colored urine or do not pass urine frequently you should drink more fluids.    If your symptoms worsen   If you feel like you are going to pass out (faint)  You have a new problem  MAKE SURE YOU   Understand these instructions.  Will watch your condition.  Will get help right away if you are not doing well or get worse.  Your e-visit answers were reviewed by a board certified advanced clinical practitioner to complete your personal care plan.  Depending on the condition, your plan could have included both over the counter or prescription medications.  If there is a problem please reply  once you have received a response from your provider.  Your safety is important to us.  If you have drug allergies check your prescription carefully.    You can use MyChart to ask questions about today's visit, request a non-urgent call  back, or ask for a work or school excuse for 24 hours related to this e-Visit. If it has been greater than 24 hours you will need to follow up with your provider, or enter a new e-Visit to address those concerns.   You will get an e-mail in the next two days asking about your experience.  I hope that your e-visit has been valuable and will speed your recovery. Thank you for using e-visits.  5-10 minutes spent reviewing and documenting in chart.  

## 2020-04-22 ENCOUNTER — Encounter: Payer: Self-pay | Admitting: Emergency Medicine

## 2020-04-22 ENCOUNTER — Emergency Department: Payer: Medicaid Other

## 2020-04-22 ENCOUNTER — Emergency Department
Admission: EM | Admit: 2020-04-22 | Discharge: 2020-04-22 | Disposition: A | Discharge status: Home / Self Care | Payer: Medicaid Other | Attending: Emergency Medicine | Admitting: Emergency Medicine

## 2020-04-22 ENCOUNTER — Other Ambulatory Visit: Payer: Self-pay

## 2020-04-22 DIAGNOSIS — X500XXA Overexertion from strenuous movement or load, initial encounter: Secondary | ICD-10-CM | POA: Diagnosis not present

## 2020-04-22 DIAGNOSIS — M546 Pain in thoracic spine: Secondary | ICD-10-CM | POA: Diagnosis present

## 2020-04-22 DIAGNOSIS — F17210 Nicotine dependence, cigarettes, uncomplicated: Secondary | ICD-10-CM | POA: Insufficient documentation

## 2020-04-22 DIAGNOSIS — R0789 Other chest pain: Secondary | ICD-10-CM | POA: Diagnosis not present

## 2020-04-22 DIAGNOSIS — Y990 Civilian activity done for income or pay: Secondary | ICD-10-CM | POA: Insufficient documentation

## 2020-04-22 DIAGNOSIS — M62830 Muscle spasm of back: Secondary | ICD-10-CM | POA: Diagnosis not present

## 2020-04-22 LAB — BASIC METABOLIC PANEL
Anion gap: 8 (ref 5–15)
BUN: 11 mg/dL (ref 6–20)
CO2: 23 mmol/L (ref 22–32)
Calcium: 9.2 mg/dL (ref 8.9–10.3)
Chloride: 107 mmol/L (ref 98–111)
Creatinine, Ser: 0.65 mg/dL (ref 0.44–1.00)
GFR, Estimated: >60 mL/min (ref >60)
Glucose, Bld: 92 mg/dL (ref 70–99)
Potassium: 3.8 mmol/L (ref 3.5–5.1)
Sodium: 138 mmol/L (ref 135–145)

## 2020-04-22 LAB — CBC
HCT: 44.2 % (ref 36.0–46.0)
Hemoglobin: 14.9 g/dL (ref 12.0–15.0)
MCH: 30.5 pg (ref 26.0–34.0)
MCHC: 33.7 g/dL (ref 30.0–36.0)
MCV: 90.6 fL (ref 80.0–100.0)
Platelets: 205 10*3/uL (ref 150–400)
RBC: 4.88 MIL/uL (ref 3.87–5.11)
RDW: 13.2 % (ref 11.5–15.5)
WBC: 4.9 10*3/uL (ref 4.0–10.5)
nRBC: 0 % (ref 0.0–0.2)

## 2020-04-22 LAB — TROPONIN I (HIGH SENSITIVITY): Troponin I (High Sensitivity): <2 ng/L (ref <18)

## 2020-04-22 MED ORDER — CYCLOBENZAPRINE HCL 5 MG PO TABS: 5.0000 mg | ORAL_TABLET | Freq: Three times a day (TID) | ORAL | 0 refills | Status: DC | PRN

## 2020-04-22 MED ORDER — IBUPROFEN 800 MG PO TABS: 800.0000 mg | ORAL_TABLET | Freq: Three times a day (TID) | ORAL | 0 refills | Status: DC | PRN

## 2020-04-22 MED ORDER — FAMOTIDINE 20 MG PO TABS: 20.0000 mg | ORAL_TABLET | Freq: Two times a day (BID) | ORAL | 0 refills | Status: DC

## 2020-04-22 NOTE — ED Notes (Signed)
Patient report 6/10 burning pain in left shoulder that radiates underneath arm to lower chest. Patient states she feels it is related to recent shoulder injury which occurred at work a few weeks ago.

## 2020-04-22 NOTE — ED Triage Notes (Signed)
Pt reports 4 days ago she started with burning pain in her left arm that radiates up and into her shoulder and across her chest. Pt reports feels SOB when the pain hits. Pt denies pain at this time.

## 2020-04-22 NOTE — ED Provider Notes (Signed)
[facility] Emergency Department Provider Note  ____________________________________________   Event Date/Time   First MD Initiated Contact with Patient 04/22/20 1211     (approximate)  I have reviewed the triage vital signs and the nursing notes.   HISTORY  Chief Complaint Chest Pain and Shortness of Breath  HPI Jasmine Gray is a 30 y.o. female presents herself to the ED for evaluation of bilateral rib and scapulothoracic back pain that started about 4 days ago.  Patient describes onset was probably a day after she reportedly lifted something heavy and awkward at work.  Since that time she has had some burning in her left arm musculature which is consistent with her reported pulling injury.  She also reports some mild shortness of breath when the pain hits.  She describes no cough, congestion, fever, chills, sweats, or nausea.  Patient also denies any frank substernal chest pain, shortness of breath, or syncope.  She is been taking Tylenol in interim without significant benefit.  She denies any referral of her pain, and denies any worsening of her symptoms.  She describes the pain is reproducible with range of motion of her arms as well as transitioning from lying down to sitting up.     Past Medical History:  Diagnosis Date  . Acid reflux    on no RX at present  . Hemorrhoid 11/2015   starting on past Sat. Pt states it is on the outside  . Tobacco abuse     Patient Active Problem List   Diagnosis Date Noted  . History of premature delivery 04/29/2016  . Overweight (BMI 25.0-29.9) 07/28/2015  . Tobacco user 05/31/2014    Past Surgical History:  Procedure Laterality Date  . NO PAST SURGERIES      Prior to Admission medications   Medication Sig Start Date End Date Taking? Authorizing Provider  cyclobenzaprine (FLEXERIL) 5 MG tablet Take 1 tablet (5 mg total) by mouth 3 (three) times daily as needed. 04/22/20  Yes Finlee Concepcion, Charlesetta Ivory, PA-C   famotidine (PEPCID) 20 MG tablet Take 1 tablet (20 mg total) by mouth 2 (two) times daily. 04/22/20 05/22/20 Yes Alexina Niccoli, Charlesetta Ivory, PA-C  ibuprofen (ADVIL) 800 MG tablet Take 1 tablet (800 mg total) by mouth every 8 (eight) hours as needed. 04/22/20  Yes Darleen Moffitt, Charlesetta Ivory, PA-C  albuterol (VENTOLIN HFA) 108 (90 Base) MCG/ACT inhaler Inhale 2 puffs into the lungs every 6 (six) hours as needed for wheezing or shortness of breath. 02/14/20 04/22/20  Junie Spencer, FNP  fluticasone (FLONASE) 50 MCG/ACT nasal spray Place 2 sprays into both nostrils daily. 02/14/20 04/22/20  Junie Spencer, FNP  metoprolol tartrate (LOPRESSOR) 25 MG tablet Take 0.5 tablets (12.5 mg total) by mouth 2 (two) times daily as needed (Palpitations). Patient not taking: Reported on 07/08/2019 03/29/19 04/22/20  Shaune Pollack, MD  omeprazole (PRILOSEC) 20 MG capsule Take 1 capsule (20 mg total) by mouth daily. 07/17/19 04/22/20  McVey, Madelaine Bhat, PA-C    Allergies Penicillins  Family History  Problem Relation Age of Onset  . Heart disease Father   . Hypertension Father   . Congestive Heart Failure Father   . Stroke Father   . Hypertension Mother   . Migraines Mother   . Cancer Maternal Grandmother   . Alcohol abuse Maternal Grandfather   . Depression Neg Hx     Social History Social History   Tobacco Use  . Smoking status: Current Every Day Smoker  Packs/day: 0.25    Years: 5.00    Pack years: 1.25    Types: Cigarettes  . Smokeless tobacco: Never Used  Vaping Use  . Vaping Use: Never used  Substance Use Topics  . Alcohol use: No  . Drug use: No    Review of Systems  Constitutional: No fever/chills Eyes: No visual changes. ENT: No sore throat. Cardiovascular: Denies chest pain. Respiratory: Denies shortness of breath. Gastrointestinal: No abdominal pain.  No nausea, no vomiting.  No diarrhea.  No constipation. Genitourinary: Negative for dysuria. Musculoskeletal: Negative for back  pain. Skin: Negative for rash. Neurological: Negative for headaches, focal weakness or numbness. ____________________________________________   PHYSICAL EXAM:  VITAL SIGNS: ED Triage Vitals  Enc Vitals Group     BP 04/22/20 1027 (!) 143/89     Pulse Rate 04/22/20 1027 87     Resp 04/22/20 1027 20     Temp 04/22/20 1027 99.1 F (37.3 C)     Temp Source 04/22/20 1027 Oral     SpO2 04/22/20 1027 99 %     Weight 04/22/20 1028 203 lb (92.1 kg)     Height 04/22/20 1028 5\' 7"  (1.702 m)     Head Circumference --      Peak Flow --      Pain Score 04/22/20 1028 0     Pain Loc --      Pain Edu? --      Excl. in GC? --    Constitutional: Alert and oriented. Well appearing and in no acute distress. Eyes: Conjunctivae are normal. PERRL. EOMI. Head: Atraumatic. Nose: No congestion/rhinnorhea. Mouth/Throat: Mucous membranes are moist.  Oropharynx non-erythematous. Neck: No stridor. No cervical spine tenderness to palpation. Cardiovascular: Normal rate, regular rhythm. Grossly normal heart sounds.  Good peripheral circulation. Respiratory: Normal respiratory effort.  No retractions. Lungs CTAB. Gastrointestinal: Soft and nontender. No distention. No abdominal bruits. No CVA tenderness. Musculoskeletal: Normal spinal alignment without midline tenderness, spasm, deformity, or step-off of the cervical, thoracic, or lumbar spine.  Patient is tender, however to palpation over the bilateral scapulothoracic musculature.  She also has some pain tenderness throughout the bilateral latissimus and serratus musculature.  Normal upper extremity range of motion and strength testing.  No rotator cuff deficit appreciated.  No lower extremity tenderness nor edema.  No joint effusions. Neurologic: Cranial nerves II to XII grossly intact.  Normal speech and language. No gross focal neurologic deficits are appreciated. No gait instability. Skin:  Skin is warm, dry and intact. No rash noted. Psychiatric: Mood and  affect are normal. Speech and behavior are normal.  ____________________________________________   LABS (all labs ordered are listed, but only abnormal results are displayed)  Labs Reviewed  BASIC METABOLIC PANEL  CBC  TROPONIN I (HIGH SENSITIVITY)  TROPONIN I (HIGH SENSITIVITY)   ____________________________________________  EKG  Vent. rate 95 BPM PR interval 150 ms QRS duration 66 ms QT/QTc 328/412 ms P-R-T axes 40 42 22 ____________________________________________  RADIOLOGY I, 04/24/20, personally viewed and evaluated these images (plain radiographs) as part of my medical decision making, as well as reviewing the written report by the radiologist.  ED MD interpretation:  No acute STEMI  Official radiology report(s): DG Chest 2 View  Result Date: 04/22/2020 CLINICAL DATA:  Chest pain and shortness of breath EXAM: CHEST - 2 VIEW COMPARISON:  03/29/2019 FINDINGS: The heart size and mediastinal contours are within normal limits. Both lungs are clear. The visualized skeletal structures are unremarkable. IMPRESSION: No active cardiopulmonary  disease. Electronically Signed   By: Gaylyn Rong M.D.   On: 04/22/2020 11:12    ____________________________________________   PROCEDURES  Procedure(s) performed (including Critical Care):  Procedures   ____________________________________________   INITIAL IMPRESSION / ASSESSMENT AND PLAN / ED COURSE  As part of my medical decision making, I reviewed the following data within the electronic MEDICAL RECORD NUMBER Labs reviewed WNL, EKG interpreted NSR, Notes from prior ED visits and [place] Controlled Substance Database  DDX: MSK, PE, ACS, CAP  Patient with ED evaluation of 4 days of persistent bilateral reproducible pain to the upper chest and mid back.  Patient reports symptoms are consistent with palpation across the rib and scapulothoracic musculature.  She endorses preceding trauma at onset.  Exam is overall  benign reassuring at this time.  No signs of acute respiratory distress, dehydration, or toxic appearance.  I doubt a cardiac etiology as patient does not endorse any substernal pain, diaphoresis, or syncope.  She also has had symptoms for greater than 3 days.  Normal labs including troponin and EKG is also reassuring.  No radiologic evidence of any acute intrathoracic process on the chest x-ray reviewed by me.  Patient overall is reassured by her work-up and is agreeable to a trial of antispasmodics in addition to prescription anti-inflammatory.  She will follow-up with her primary provider or local urgent care and return if necessary.  Return precautions have been discussed. ____________________________________________   FINAL CLINICAL IMPRESSION(S) / ED DIAGNOSES  Final diagnoses:  Chest wall pain  Spasm of thoracic back muscle     ED Discharge Orders         Ordered    ibuprofen (ADVIL) 800 MG tablet  Every 8 hours PRN        04/22/20 1245    cyclobenzaprine (FLEXERIL) 5 MG tablet  3 times daily PRN        04/22/20 1245    famotidine (PEPCID) 20 MG tablet  2 times daily        04/22/20 1246          *Please note:  Jasmine Gray was evaluated in Emergency Department on 04/22/2020 for the symptoms described in the history of present illness. She was evaluated in the context of the global COVID-19 pandemic, which necessitated consideration that the patient might be at risk for infection with the SARS-CoV-2 virus that causes COVID-19. Institutional protocols and algorithms that pertain to the evaluation of patients at risk for COVID-19 are in a state of rapid change based on information released by regulatory bodies including the CDC and federal and state organizations. These policies and algorithms were followed during the patient's care in the ED.  Some ED evaluations and interventions may be delayed as a result of limited staffing during and the pandemic.*   Note:  This document was  prepared using Dragon voice recognition software and may include unintentional dictation errors.    Lissa Hoard, PA-C 04/22/20 1552    Chesley Noon, MD 04/23/20 1023

## 2020-04-22 NOTE — Discharge Instructions (Signed)
Take the prescription meds as directed. Follow-up with your selected provider for continued symptoms.

## 2020-05-30 ENCOUNTER — Telehealth: Payer: Medicaid Other | Admitting: Physician Assistant

## 2020-05-30 DIAGNOSIS — R300 Dysuria: Secondary | ICD-10-CM | POA: Diagnosis not present

## 2020-05-30 MED ORDER — NITROFURANTOIN MONOHYD MACRO 100 MG PO CAPS: 100.0000 mg | ORAL_CAPSULE | Freq: Two times a day (BID) | ORAL | 0 refills | Status: DC

## 2020-05-30 NOTE — Progress Notes (Signed)
I have spent 5 minutes in review of e-visit questionnaire, review and updating patient chart, medical decision making and response to patient.   Brittannie Tawney Cody Merryn Thaker, PA-C    

## 2020-05-30 NOTE — Progress Notes (Signed)

## 2020-07-21 ENCOUNTER — Telehealth: Payer: Medicaid Other | Admitting: Physician Assistant

## 2020-07-21 DIAGNOSIS — Z20818 Contact with and (suspected) exposure to other bacterial communicable diseases: Secondary | ICD-10-CM | POA: Diagnosis not present

## 2020-07-21 DIAGNOSIS — J029 Acute pharyngitis, unspecified: Secondary | ICD-10-CM

## 2020-07-21 MED ORDER — AZITHROMYCIN 250 MG PO TABS: ORAL_TABLET | ORAL | 0 refills | Status: AC

## 2020-07-21 NOTE — Progress Notes (Signed)
I have spent 5 minutes in review of e-visit questionnaire, review and updating patient chart, medical decision making and response to patient.   Allizon Woznick Cody Willaim Mode, PA-C    

## 2020-07-21 NOTE — Progress Notes (Signed)

## 2020-07-21 NOTE — Progress Notes (Signed)
Message sent to patient requesting further input regarding current symptoms. Awaiting patient response.  

## 2020-08-01 ENCOUNTER — Telehealth: Payer: Medicaid Other | Admitting: Physician Assistant

## 2020-08-01 DIAGNOSIS — B379 Candidiasis, unspecified: Secondary | ICD-10-CM | POA: Diagnosis not present

## 2020-08-01 DIAGNOSIS — T3695XA Adverse effect of unspecified systemic antibiotic, initial encounter: Secondary | ICD-10-CM | POA: Diagnosis not present

## 2020-08-01 MED ORDER — FLUCONAZOLE 150 MG PO TABS: 150.0000 mg | ORAL_TABLET | Freq: Once | ORAL | 0 refills | Status: AC

## 2020-08-01 NOTE — Progress Notes (Signed)
I have spent 5 minutes in review of e-visit questionnaire, review and updating patient chart, medical decision making and response to patient.   Aarika Moon Cody Nykolas Bacallao, PA-C    

## 2020-08-01 NOTE — Progress Notes (Signed)

## 2020-10-20 ENCOUNTER — Telehealth: Payer: Medicaid Other | Admitting: Family Medicine

## 2020-10-20 DIAGNOSIS — Z3200 Encounter for pregnancy test, result unknown: Secondary | ICD-10-CM

## 2020-10-20 NOTE — Progress Notes (Signed)
[place] Needs in person testing and pregnancy rule out

## 2020-12-07 ENCOUNTER — Telehealth: Payer: Medicaid Other | Admitting: Nurse Practitioner

## 2020-12-07 DIAGNOSIS — R197 Diarrhea, unspecified: Secondary | ICD-10-CM

## 2020-12-07 MED ORDER — LOPERAMIDE HCL 2 MG PO CAPS: 2.0000 mg | ORAL_CAPSULE | ORAL | 0 refills | Status: DC | PRN

## 2020-12-07 NOTE — Progress Notes (Signed)
I have spent 5 minutes in review of e-visit questionnaire, review and updating patient chart, medical decision making and response to patient.  ° °Davida Falconi W Jordy Hewins, NP ° °  °

## 2020-12-07 NOTE — Progress Notes (Signed)
We are sorry that you are not feeling well.  Here is how we plan to help!  Based on what you have shared with me it looks like you have Acute Infectious Diarrhea.  Most cases of acute diarrhea are due to infections with virus and bacteria and are self-limited conditions lasting less than 14 days.  For your symptoms you may take Imodium 2 mg tablets. Take two tablet now and then one after each loose stool up to 6 a day.  Antibiotics are not needed for most people with diarrhea.    HOME CARE We recommend changing your diet to help with your symptoms for the next few days. Drink plenty of fluids that contain water salt and sugar. Sports drinks such as Gatorade may help.  You may try broths, soups, bananas, applesauce, soft breads, mashed potatoes or crackers.  You are considered infectious for as long as the diarrhea continues. Hand washing or use of alcohol based hand sanitizers is recommend. It is best to stay out of work or school until your symptoms stop.   GET HELP RIGHT AWAY If you have dark yellow colored urine or do not pass urine frequently you should drink more fluids.   If your symptoms worsen  If you feel like you are going to pass out (faint) You have a new problem  MAKE SURE YOU  Understand these instructions. Will watch your condition. Will get help right away if you are not doing well or get worse.  Thank you for choosing an e-visit.  Your e-visit answers were reviewed by a board certified advanced clinical practitioner to complete your personal care plan. Depending upon the condition, your plan could have included both over the counter or prescription medications.  Please review your pharmacy choice. Make sure the pharmacy is open so you can pick up prescription now. If there is a problem, you may contact your provider through MyChart messaging and have the prescription routed to another pharmacy.  Your safety is important to us. If you have drug allergies check your  prescription carefully.   For the next 24 hours you can use MyChart to ask questions about today's visit, request a non-urgent call back, or ask for a work or school excuse. You will get an email in the next two days asking about your experience. I hope that your e-visit has been valuable and will speed your recovery.  

## 2020-12-11 ENCOUNTER — Telehealth: Payer: Medicaid Other | Admitting: Physician Assistant

## 2020-12-11 DIAGNOSIS — B9689 Other specified bacterial agents as the cause of diseases classified elsewhere: Secondary | ICD-10-CM

## 2020-12-11 MED ORDER — METRONIDAZOLE 500 MG PO TABS: 500.0000 mg | ORAL_TABLET | Freq: Two times a day (BID) | ORAL | 0 refills | Status: DC

## 2020-12-11 NOTE — Progress Notes (Signed)
I have spent 5 minutes in review of e-visit questionnaire, review and updating patient chart, medical decision making and response to patient.   Kinisha Soper Cody Julyan Gales, PA-C    

## 2020-12-11 NOTE — Progress Notes (Signed)

## 2020-12-13 MED ORDER — METRONIDAZOLE 0.75 % VA GEL
1.0000 | Freq: Two times a day (BID) | VAGINAL | 0 refills | Status: DC
Start: 2020-12-13 — End: 2021-08-01

## 2020-12-13 NOTE — Addendum Note (Signed)
Addended by: Margaretann Loveless on: 12/13/2020 04:25 PM   Modules accepted: Orders

## 2021-01-12 ENCOUNTER — Ambulatory Visit: Payer: Medicaid Other

## 2021-01-12 ENCOUNTER — Telehealth: Payer: Medicaid Other | Admitting: Physician Assistant

## 2021-01-12 DIAGNOSIS — R6889 Other general symptoms and signs: Secondary | ICD-10-CM

## 2021-01-12 NOTE — Progress Notes (Signed)
E visit for Flu like symptoms   We are sorry that you are not feeling well.  Here is how we plan to help! Based on what you have shared with me it looks like you may have flu-like symptoms that should be watched but do not seem to indicate anti-viral treatment.  Influenza or "the flu" is   an infection caused by a respiratory virus. The flu virus is highly contagious and persons who did not receive their yearly flu vaccination may "catch" the flu from close contact.  We have anti-viral medications to treat the viruses that cause this infection. They are not a "cure" and only shorten the course of the infection. These prescriptions are most effective when they are given within the first 2 days of "flu" symptoms. Antiviral medication are indicated if you have a high risk of complications from the flu. You should  also consider an antiviral medication if you are in close contact with someone who is at risk. These medications can help patients avoid complications from the flu  but have side effects that you should know. Possible side effects from Tamiflu or oseltamivir include nausea, vomiting, diarrhea, dizziness, headaches, eye redness, sleep problems or other respiratory symptoms. You should not take Tamiflu if you have an allergy to oseltamivir or any to the ingredients in Tamiflu.  Based upon your symptoms and potential risk factors I recommend that you follow the flu symptoms recommendation that I have listed below.  ANYONE WHO HAS FLU SYMPTOMS SHOULD: Stay home. The flu is highly contagious and going out or to work exposes others! Be sure to drink plenty of fluids. Water is fine as well as fruit juices, sodas and electrolyte beverages. You may want to stay away from caffeine or alcohol. If you are nauseated, try taking small sips of liquids. How do you know if you are getting enough fluid? Your urine should be a pale yellow or almost colorless. Get rest. Taking a steamy shower or using a  humidifier may help nasal congestion and ease sore throat pain. Using a saline nasal spray works much the same way. Cough drops, hard candies and sore throat lozenges may ease your cough. You can also use Tylenol and Ibuprofen for fever, aches or sore throat.  OTC Mucinex-DM for cough Line up a caregiver. Have someone check on you regularly.   GET HELP RIGHT AWAY IF: You cannot keep down liquids or your medications. You become short of breath Your fell like you are going to pass out or loose consciousness. Your symptoms persist after you have completed your treatment plan MAKE SURE YOU  Understand these instructions. Will watch your condition. Will get help right away if you are not doing well or get worse.  Your e-visit answers were reviewed by a board certified advanced clinical practitioner to complete your personal care plan.  Depending on the condition, your plan could have included both over the counter or prescription medications.  If there is a problem please reply  once you have received a response from your provider.  Your safety is important to [place].  If you have drug allergies check your prescription carefully.    You can use MyChart to ask questions about today's visit, request a non-urgent call back, or ask for a work or school excuse for 24 hours related to this e-Visit. If it has been greater than 24 hours you will need to follow up with your provider, or enter a new e-Visit to address those concerns.  You will get an e-mail in the next two days asking about your experience.  I hope that your e-visit has been valuable and will speed your recovery. Thank you for using e-visits.

## 2021-01-12 NOTE — Progress Notes (Signed)
I have spent 5 minutes in review of e-visit questionnaire, review and updating patient chart, medical decision making and response to patient.   Nel Stoneking Cody Charliene Inoue, PA-C    

## 2021-04-01 ENCOUNTER — Encounter: Payer: Self-pay | Admitting: Emergency Medicine

## 2021-04-01 ENCOUNTER — Telehealth: Payer: Medicaid Other | Admitting: Emergency Medicine

## 2021-04-01 DIAGNOSIS — H109 Unspecified conjunctivitis: Secondary | ICD-10-CM

## 2021-04-01 MED ORDER — POLYMYXIN B-TRIMETHOPRIM 10000-0.1 UNIT/ML-% OP SOLN: 1.0000 [drp] | Freq: Four times a day (QID) | OPHTHALMIC | 0 refills | Status: AC

## 2021-04-01 NOTE — Progress Notes (Signed)
I have spent 5 minutes in review of e-visit questionnaire, review and updating patient chart, medical decision making and response to patient.   Vito Beg, PA-C    

## 2021-04-01 NOTE — Progress Notes (Signed)
E-Visit for [employer]  As a side note: pink eye, or bacterial conjunctivitis, will not cause dizziness or fatigue.  Please seek in person evaluation for further evaluation and management of those symptoms  We are sorry that you are not feeling well.  Here is how we plan to help!  Based on what you have shared with me it looks like you have conjunctivitis.  Conjunctivitis is a common inflammatory or infectious condition of the eye that is often referred to as "pink eye".  In most cases it is contagious (viral or bacterial). However, not all conjunctivitis requires antibiotics (ex. Allergic).  We have made appropriate suggestions for you based upon your presentation.  I have prescribed Polytrim Ophthalmic drops 1-2 drops 4 times a day times 5 days  Pink eye can be highly contagious.  It is typically spread through direct contact with secretions, or contaminated objects or surfaces that one may have touched.  Strict handwashing is suggested with soap and water is urged.  If not available, use alcohol based had sanitizer.  Avoid unnecessary touching of the eye.  If you wear contact lenses, you will need to refrain from wearing them until you see no white discharge from the eye for at least 24 hours after being on medication.  You should see symptom improvement in 1-2 days after starting the medication regimen.  Call us if symptoms are not improved in 1-2 days.  Home Care: Wash your hands often! Do not wear your contacts until you complete your treatment plan. Avoid sharing towels, bed linen, personal items with a person who has pink eye. See attention for anyone in your home with similar symptoms.  Get Help Right Away If: Your symptoms do not improve. You develop blurred or loss of vision. Your symptoms worsen (increased discharge, pain or redness)   Thank you for choosing an e-visit.  Your e-visit answers were reviewed by a board certified advanced clinical practitioner to complete your  personal care plan. Depending upon the condition, your plan could have included both over the counter or prescription medications.  Please review your pharmacy choice. Make sure the pharmacy is open so you can pick up prescription now. If there is a problem, you may contact your provider through [employer] and have the prescription routed to another pharmacy.  Your safety is important to [place]. If you have drug allergies check your prescription carefully.   For the next 24 hours you can use MyChart to ask questions about today's visit, request a non-urgent call back, or ask for a work or school excuse. You will get an email in the next two days asking about your experience. I hope that your e-visit has been valuable and will speed your recovery.

## 2021-04-18 ENCOUNTER — Telehealth: Payer: Medicaid Other | Admitting: Physician Assistant

## 2021-04-18 DIAGNOSIS — N760 Acute vaginitis: Secondary | ICD-10-CM

## 2021-04-18 DIAGNOSIS — N3090 Cystitis, unspecified without hematuria: Secondary | ICD-10-CM

## 2021-04-18 MED ORDER — FLUCONAZOLE 150 MG PO TABS
150.0000 mg | ORAL_TABLET | Freq: Once | ORAL | 0 refills | Status: AC
Start: 2021-04-18 — End: 2021-04-18

## 2021-04-18 MED ORDER — CEPHALEXIN 500 MG PO CAPS: 500.0000 mg | ORAL_CAPSULE | Freq: Two times a day (BID) | ORAL | 0 refills | Status: AC

## 2021-04-18 NOTE — Progress Notes (Signed)
E-Visit for Vaginal Symptoms ? ?We are sorry that you are not feeling well. Here is how we plan to help! ?Based on what you shared with me it looks like you: May have a yeast vaginosis ? ?Vaginosis is an inflammation of the vagina that can result in discharge, itching and pain. The cause is usually a change in the normal balance of vaginal bacteria or an infection. Vaginosis can also result from reduced estrogen levels after menopause. ? ?The most common causes of vaginosis are: ? ? Bacterial vaginosis which results from an overgrowth of one on several organisms that are normally present in your vagina. ? ? Yeast infections which are caused by a naturally occurring fungus called candida. ? ? Vaginal atrophy (atrophic vaginosis) which results from the thinning of the vagina from reduced estrogen levels after menopause. ? ? Trichomoniasis which is caused by a parasite and is commonly transmitted by sexual intercourse. ? ?Factors that increase your risk of developing vaginosis include: ?Medications, such as antibiotics and steroids ?Uncontrolled diabetes ?Use of hygiene products such as bubble bath, vaginal spray or vaginal deodorant ?Douching ?Wearing damp or tight-fitting clothing ?Using an intrauterine device (IUD) for birth control ?Hormonal changes, such as those associated with pregnancy, birth control pills or menopause ?Sexual activity ?Having a sexually transmitted infection ? ?Your treatment plan is A single Diflucan (fluconazole) 150mg  tablet once.  I have electronically sent this prescription into the pharmacy that you have chosen. ? ?I have also prescribed an antibiotic called keflex for a suspected urinary tract infection.  ? ?Be sure to take all of the medication as directed. Stop taking any medication if you develop a rash, tongue swelling or shortness of breath. Mothers who are breast feeding should consider pumping and discarding their breast milk while on these antibiotics. However, there is no  consensus that infant exposure at these doses would be harmful.  ?Remember that medication creams can weaken latex condoms. ?. ? ? ?HOME CARE: ? ?Good hygiene may prevent some types of vaginosis from recurring and may relieve some symptoms: ? ?Avoid baths, hot tubs and whirlpool spas. Rinse soap from your outer genital area after a shower, and dry the area well to prevent irritation. Don't use scented or harsh soaps, such as those with deodorant or antibacterial action. ?Avoid irritants. These include scented tampons and pads. ?Wipe from front to back after using the toilet. Doing so avoids spreading fecal bacteria to your vagina. ? ?Other things that may help prevent vaginosis include: ? ?Don't douche. Your vagina doesn't require cleansing other than normal bathing. Repetitive douching disrupts the normal organisms that reside in the vagina and can actually increase your risk of vaginal infection. Douching won't clear up a vaginal infection. ?Use a latex condom. Both female and female latex condoms may help you avoid infections spread by sexual contact. ?Wear cotton underwear. Also wear pantyhose with a cotton crotch. If you feel comfortable without it, skip wearing underwear to bed. Yeast thrives in moist environments ?Your symptoms should improve in the next day or two. ? ?GET HELP RIGHT AWAY IF: ? ?You have pain in your lower abdomen ( pelvic area or over your ovaries) ?You develop nausea or vomiting ?You develop a fever ?Your discharge changes or worsens ?You have persistent pain with intercourse ?You develop shortness of breath, a rapid pulse, or you faint. ? ?These symptoms could be signs of problems or infections that need to be evaluated by a medical provider now. ? ?MAKE SURE YOU  ? ?  Understand these instructions. ?Will watch your condition. ?Will get help right away if you are not doing well or get worse. ? ?Thank you for choosing an e-visit. ? ?Your e-visit answers were reviewed by a board certified  advanced clinical practitioner to complete your personal care plan. Depending upon the condition, your plan could have included both over the counter or prescription medications. ? ?Please review your pharmacy choice. Make sure the pharmacy is open so you can pick up prescription now. If there is a problem, you may contact your provider through [employer] and have the prescription routed to another pharmacy.  Your safety is important to [place]. If you have drug allergies check your prescription carefully.  ? ?For the next 24 hours you can use MyChart to ask questions about today's visit, request a non-urgent call back, or ask for a work or school excuse. ?You will get an email in the next two days asking about your experience. I hope that your e-visit has been valuable and will speed your recovery. ? ?Approximately 5 minutes was spent documenting and reviewing patient's chart. ? ?

## 2021-04-20 ENCOUNTER — Telehealth: Payer: Medicaid Other | Admitting: Physician Assistant

## 2021-04-20 DIAGNOSIS — R3989 Other symptoms and signs involving the genitourinary system: Secondary | ICD-10-CM

## 2021-04-20 NOTE — Progress Notes (Signed)
Based on what you shared with me, I feel your condition warrants further evaluation and I recommend that you be seen in a face to face visit. ? ?Being that you had an antibiotic (Cephalexin/Keflex) and diflucan (for yeast) sent in on 04/18/21 for the same symptoms and are still having symptoms you should be seen in person to rule out other causes of your symptoms.  ?  ?NOTE: There will be NO CHARGE for this eVisit ?  ?If you are having a true medical emergency please call 911.   ?  ? For an urgent face to face visit, [facility] has six urgent care centers for your convenience:  ?  ? [facility] at [facility] ?Get Driving Directions ?[phone] ?[phone] ?[place], [place] [address] ?  ? [facility]) ?Get Driving Directions ?[phone] ?[address] ?[place], [place] [address] ? ?[facility]) ?Get Driving Directions ?[phone] ?[address] ?[place],  [place]  [address] ? ?[facility] ?Get Driving Directions ?[phone] ?[address] ?[place], [place] [address] ?  ?[facility] [place] ?Get Driving Directions  ?[phone] ?[address] ?[address] ?[place], [place] [address] ?  ?[facility] ?Get Driving Directions ?[phone] ?[address] ?[place], [place] [address] ? ?Your MyChart E-visit questionnaire answers were reviewed by a board certified advanced clinical practitioner to complete your personal care plan based on your specific symptoms.  Thank you for using e-Visits. ?  ?I provided 5 minutes of non face-to-face time during this encounter for chart review and documentation.  ? ?

## 2021-04-24 ENCOUNTER — Telehealth: Payer: Medicaid Other | Admitting: Physician Assistant

## 2021-04-24 DIAGNOSIS — H60332 Swimmer's ear, left ear: Secondary | ICD-10-CM

## 2021-04-24 MED ORDER — NEOMYCIN-POLYMYXIN-HC 3.5-10000-1 OT SOLN: 3.0000 [drp] | Freq: Four times a day (QID) | OTIC | 0 refills | Status: DC

## 2021-04-24 NOTE — Progress Notes (Signed)

## 2021-04-28 ENCOUNTER — Telehealth: Payer: Medicaid Other | Admitting: Family Medicine

## 2021-04-28 DIAGNOSIS — N3000 Acute cystitis without hematuria: Secondary | ICD-10-CM | POA: Diagnosis not present

## 2021-04-28 DIAGNOSIS — N390 Urinary tract infection, site not specified: Secondary | ICD-10-CM | POA: Insufficient documentation

## 2021-04-28 MED ORDER — SULFAMETHOXAZOLE-TRIMETHOPRIM 800-160 MG PO TABS: 1.0000 | ORAL_TABLET | Freq: Two times a day (BID) | ORAL | 0 refills | Status: AC

## 2021-04-28 NOTE — Progress Notes (Signed)
E-Visit for Urinary Problems  We are sorry that you are not feeling well.  Here is how we plan to help!  Based on what you shared with me it looks like you most likely have a simple urinary tract infection.  A UTI (Urinary Tract Infection) is a bacterial infection of the bladder.  Most cases of urinary tract infections are simple to treat but a key part of your care is to encourage you to drink plenty of fluids and watch your symptoms carefully.  I have prescribed Bactrim DS One tablet twice a day for 5 days.  Your symptoms should gradually improve. Call us if the burning in your urine worsens, you develop worsening fever, back pain or pelvic pain or if your symptoms do not resolve after completing the antibiotic.  Urinary tract infections can be prevented by drinking plenty of water to keep your body hydrated.  Also be sure when you wipe, wipe from front to back and don't hold it in!  If possible, empty your bladder every 4 hours.  HOME CARE Drink plenty of fluids Compete the full course of the antibiotics even if the symptoms resolve Remember, when you need to go.go. Holding in your urine can increase the likelihood of getting a UTI! GET HELP RIGHT AWAY IF: You cannot urinate You get a high fever Worsening back pain occurs You see blood in your urine You feel sick to your stomach or throw up You feel like you are going to pass out  MAKE SURE YOU  Understand these instructions. Will watch your condition. Will get help right away if you are not doing well or get worse.   Thank you for choosing an e-visit.  Your e-visit answers were reviewed by a board certified advanced clinical practitioner to complete your personal care plan. Depending upon the condition, your plan could have included both over the counter or prescription medications.  Please review your pharmacy choice. Make sure the pharmacy is open so you can pick up prescription now. If there is a problem, you may contact  your provider through MyChart messaging and have the prescription routed to another pharmacy.  Your safety is important to us. If you have drug allergies check your prescription carefully.   For the next 24 hours you can use MyChart to ask questions about today's visit, request a non-urgent call back, or ask for a work or school excuse. You will get an email in the next two days asking about your experience. I hope that your e-visit has been valuable and will speed your recovery.    have provided 5 minutes of non face to face time during this encounter for chart review and documentation.   

## 2021-05-03 ENCOUNTER — Telehealth: Payer: Medicaid Other | Admitting: Physician Assistant

## 2021-05-03 ENCOUNTER — Ambulatory Visit
Admission: RE | Admit: 2021-05-03 | Discharge: 2021-05-03 | Disposition: A | Discharge status: Home / Self Care | Payer: Medicaid Other | Source: Ambulatory Visit | Attending: Emergency Medicine | Admitting: Emergency Medicine

## 2021-05-03 VITALS — BP 139/90 | HR 106 | Temp 98.4°F | Resp 18

## 2021-05-03 DIAGNOSIS — R350 Frequency of micturition: Secondary | ICD-10-CM

## 2021-05-03 DIAGNOSIS — M5442 Lumbago with sciatica, left side: Secondary | ICD-10-CM

## 2021-05-03 DIAGNOSIS — N390 Urinary tract infection, site not specified: Secondary | ICD-10-CM

## 2021-05-03 LAB — POCT URINALYSIS DIP (MANUAL ENTRY)
Bilirubin, UA: NEGATIVE
Glucose, UA: NEGATIVE mg/dL
Ketones, POC UA: NEGATIVE mg/dL
Leukocytes, UA: NEGATIVE
Nitrite, UA: NEGATIVE
Protein Ur, POC: NEGATIVE mg/dL
Spec Grav, UA: <=1.005 — AB (ref 1.010–1.025)
Urobilinogen, UA: 0.2 E.U./dL
pH, UA: 5.5 (ref 5.0–8.0)

## 2021-05-03 LAB — POCT URINE PREGNANCY: Preg Test, Ur: NEGATIVE

## 2021-05-03 MED ORDER — IBUPROFEN 600 MG PO TABS: 600.0000 mg | ORAL_TABLET | Freq: Four times a day (QID) | ORAL | 0 refills | Status: DC | PRN

## 2021-05-03 MED ORDER — METHOCARBAMOL 500 MG PO TABS: 500.0000 mg | ORAL_TABLET | Freq: Two times a day (BID) | ORAL | 0 refills | Status: DC | PRN

## 2021-05-03 NOTE — Discharge Instructions (Addendum)
Take the ibuprofen as directed.  Take the muscle relaxer as needed for muscle spasm; Do not drive, operate machinery, or drink alcohol with this medication as it can cause drowsiness.   Follow up with your primary care provider or an orthopedist if your symptoms are not improving.     

## 2021-05-03 NOTE — ED Triage Notes (Signed)
Triaged by provider  

## 2021-05-03 NOTE — Progress Notes (Signed)
Based on what you shared with me, I feel your condition warrants further evaluation and I recommend that you be seen in a face to face visit. ?  ?Giving worsening symptoms despite current treatment, you need to be evaluated in person for examination and urine testing/culture so that we can determine most appropriate changes to antibiotics, etc. Please do not delay care.  ? ?NOTE: There will be NO CHARGE for this eVisit ?  ?If you are having a true medical emergency please call 911.   ?  ? For an urgent face to face visit, [facility] has six urgent care centers for your convenience:  ?  ? [facility] at [facility] ?Get Driving Directions ?[phone] ?[phone] [address] ?[place], [place] [address] ?  ? [facility]) ?Get Driving Directions ?[phone] ?[address] ?[place], [place] [address] ? ?[facility]) ?Get Driving Directions ?[phone] ?[address] ?[place],  [place]  [address] ? ?[facility] ?Get Driving Directions ?[phone] ?[address] ?[place], [place] [address] ?  ?[facility] [place] ?Get Driving Directions  ?[phone] ?[address] ?[address] ?[place], [place] [address] ?  ?[facility] ?Get Driving Directions ?[phone] ?[address] ?[place], [place] [address] ? ?Your MyChart E-visit questionnaire answers were reviewed by a board certified advanced clinical practitioner to complete your personal care plan based on your specific symptoms.  Thank you for using e-Visits. ?  ? ?

## 2021-05-03 NOTE — ED Provider Notes (Signed)
?[facility] ? ? ? ?CSN: [id number] ?Arrival date & time: 05/03/21  1031 ? ? ?  ? ?History   ?Chief Complaint ?Chief Complaint  ?Patient presents with  ? Urinary Frequency  ? Back Pain  ? ? ?HPI ?Jasmine Gray is a 31 y.o. female.  Patient presents with urinary frequency and bilateral low back pain x2 weeks.  She had dysuria but this has resolved.  The low back pain radiates down to her left thigh.  Treatment at home with ibuprofen and Azo.  She denies fever, chills, abdominal pain, dysuria, hematuria, flank pain, vaginal discharge, pelvic pain, numbness, weakness, saddle anesthesia, loss of bowel/bladder control, or other symptoms.  Last bowel movement this morning.  Patient had an E-visit on 04/18/2021; diagnosed with vaginosis and cystitis; treated with cephalexin and Diflucan.  She had another ED visit on 04/20/2021; diagnosed with suspected UTI; and was instructed to be seen in person because of she had already been prescribed medication 2 days prior.  She had another ED visit on 04/28/2021; diagnosed with acute cystitis; treated with Bactrim DS because she reported she had not been able to get the cephalexin.  She had an E-visit again today and was instructed to be seen in person.  LMP: ended yesterday.  ? ?The history is provided by the patient and medical records.  ? ?Past Medical History:  ?Diagnosis Date  ? Acid reflux   ? on no RX at present  ? Hemorrhoid 11/2015  ? starting on past Sat. Pt states it is on the outside  ? Tobacco abuse   ? ? ?Patient Active Problem List  ? Diagnosis Date Noted  ? UTI (urinary tract infection) 04/28/2021  ? History of premature delivery 04/29/2016  ? Overweight (BMI 25.0-29.9) 07/28/2015  ? Tobacco user 05/31/2014  ? ? ?Past Surgical History:  ?Procedure Laterality Date  ? NO PAST SURGERIES    ? ? ?OB History   ? ? Gravida  ?3  ? Para  ?2  ? Term  ?1  ? Preterm  ?1  ? AB  ?1  ? Living  ?2  ?  ? ? SAB  ?1  ? IAB  ?   ? Ectopic  ?   ? Multiple  ?0  ? Live Births  ?2  ?   ?   ? Obstetric Comments  ?States at beginning of pregnancy felt light headed and ill, resolved  ?  ? ?  ? ? ? ?Home Medications   ? ?Prior to Admission medications   ?Medication Sig Start Date End Date Taking? Authorizing Provider  ?ibuprofen (ADVIL) 600 MG tablet Take 1 tablet (600 mg total) by mouth every 6 (six) hours as needed. 05/03/21  Yes Mickie Bail, NP  ?methocarbamol (ROBAXIN) 500 MG tablet Take 1 tablet (500 mg total) by mouth 2 (two) times daily as needed for muscle spasms. 05/03/21  Yes Mickie Bail, NP  ?famotidine (PEPCID) 20 MG tablet Take 1 tablet (20 mg total) by mouth 2 (two) times daily. 04/22/20 05/22/20  Menshew, Charlesetta Ivory, PA-C  ?loperamide (IMODIUM) 2 MG capsule Take 1 capsule (2 mg total) by mouth as needed for diarrhea or loose stools. 12/07/20   Claiborne Rigg, NP  ?metroNIDAZOLE (METROGEL VAGINAL) 0.75 % vaginal gel Place 1 Applicatorful vaginally 2 (two) times daily. 12/13/20   Margaretann Loveless, PA-C  ?neomycin-polymyxin-hydrocortisone (CORTISPORIN) OTIC solution Place 3 drops into the left ear 4 (four) times daily. X 7 days 04/24/21  Joycelyn ManBurnette, Jennifer M, PA-C  ?sulfamethoxazole-trimethoprim (BACTRIM DS) 800-160 MG tablet Take 1 tablet by mouth 2 (two) times daily for 5 days. 04/28/21 05/03/21  Delorse LekBlair, Diane W, FNP  ?albuterol (VENTOLIN HFA) 108 (90 Base) MCG/ACT inhaler Inhale 2 puffs into the lungs every 6 (six) hours as needed for wheezing or shortness of breath. 02/14/20 04/22/20  Junie SpencerHawks, Christy A, FNP  ?fluticasone (FLONASE) 50 MCG/ACT nasal spray Place 2 sprays into both nostrils daily. 02/14/20 04/22/20  Junie SpencerHawks, Christy A, FNP  ?metoprolol tartrate (LOPRESSOR) 25 MG tablet Take 0.5 tablets (12.5 mg total) by mouth 2 (two) times daily as needed (Palpitations). ?Patient not taking: Reported on 07/08/2019 03/29/19 04/22/20  Shaune PollackIsaacs, Cameron, MD  ?omeprazole (PRILOSEC) 20 MG capsule Take 1 capsule (20 mg total) by mouth daily. 07/17/19 04/22/20  McVey, Madelaine BhatElizabeth Whitney, PA-C  ? ? ?Family  History ?Family History  ?Problem Relation Age of Onset  ? Heart disease Father   ? Hypertension Father   ? Congestive Heart Failure Father   ? Stroke Father   ? Hypertension Mother   ? Migraines Mother   ? Cancer Maternal Grandmother   ? Alcohol abuse Maternal Grandfather   ? Depression Neg Hx   ? ? ?Social History ?Social History  ? ?Tobacco Use  ? Smoking status: Every Day  ?  Packs/day: 0.25  ?  Years: 5.00  ?  Pack years: 1.25  ?  Types: Cigarettes  ? Smokeless tobacco: Never  ?Vaping Use  ? Vaping Use: Never used  ?Substance Use Topics  ? Alcohol use: No  ? Drug use: No  ? ? ? ?Allergies   ?Penicillins ? ? ?Review of Systems ?Review of Systems  ?Constitutional:  Negative for chills and fever.  ?Respiratory:  Negative for cough and shortness of breath.   ?Cardiovascular:  Negative for chest pain and palpitations.  ?Gastrointestinal:  Negative for abdominal pain, constipation, diarrhea, nausea and vomiting.  ?Genitourinary:  Positive for frequency. Negative for dysuria, flank pain, hematuria, pelvic pain and vaginal discharge.  ?Musculoskeletal:  Positive for back pain. Negative for gait problem.  ?Skin:  Negative for color change and rash.  ?All other systems reviewed and are negative. ? ? ?Physical Exam ?Triage Vital Signs ?ED Triage Vitals  ?Enc Vitals Group  ?   BP   ?   Pulse   ?   Resp   ?   Temp   ?   Temp src   ?   SpO2   ?   Weight   ?   Height   ?   Head Circumference   ?   Peak Flow   ?   Pain Score   ?   Pain Loc   ?   Pain Edu?   ?   Excl. in GC?   ? ?No data found. ? ?Updated Vital Signs ?BP 139/90   Pulse (!) 106   Temp 98.4 ?F (36.9 ?C)   Resp 18   SpO2 99%  ? ?Visual Acuity ?Right Eye Distance:   ?Left Eye Distance:   ?Bilateral Distance:   ? ?Right Eye Near:   ?Left Eye Near:    ?Bilateral Near:    ? ?Physical Exam ?Vitals and nursing note reviewed.  ?Constitutional:   ?   General: She is not in acute distress. ?   Appearance: She is well-developed. She is not ill-appearing.  ?HENT:  ?    Mouth/Throat:  ?   Mouth: Mucous membranes are moist.  ?Cardiovascular:  ?  Rate and Rhythm: Normal rate and regular rhythm.  ?   Heart sounds: Normal heart sounds.  ?Pulmonary:  ?   Effort: Pulmonary effort is normal. No respiratory distress.  ?   Breath sounds: Normal breath sounds.  ?Abdominal:  ?   General: Bowel sounds are normal.  ?   Palpations: Abdomen is soft.  ?   Tenderness: There is no abdominal tenderness. There is no right CVA tenderness, left CVA tenderness, guarding or rebound.  ?Musculoskeletal:     ?   General: No swelling, tenderness, deformity or signs of injury. Normal range of motion.  ?   Cervical back: Neck supple.  ?     Back: ? ?Skin: ?   General: Skin is warm and dry.  ?   Capillary Refill: Capillary refill takes less than 2 seconds.  ?   Findings: No bruising, erythema, lesion or rash.  ?Neurological:  ?   General: No focal deficit present.  ?   Mental Status: She is alert and oriented to person, place, and time.  ?   Sensory: No sensory deficit.  ?   Motor: No weakness.  ?   Gait: Gait normal.  ?Psychiatric:     ?   Mood and Affect: Mood normal.     ?   Behavior: Behavior normal.  ? ? ? ?UC Treatments / Results  ?Labs ?(all labs ordered are listed, but only abnormal results are displayed) ?Labs Reviewed  ?POCT URINALYSIS DIP (MANUAL ENTRY) - Abnormal; Notable for the following components:  ?    Result Value  ? Spec Grav, UA <=1.005 (*)   ? Blood, UA small (*)   ? All other components within normal limits  ?POCT URINE PREGNANCY  ? ? ?EKG ? ? ?Radiology ?No results found. ? ?Procedures ?Procedures (including critical care time) ? ?Medications Ordered in UC ?Medications - No data to display ? ?Initial Impression / Assessment and Plan / UC Course  ?I have reviewed the triage vital signs and the nursing notes. ? ?Pertinent labs & imaging results that were available during my care of the patient were reviewed by me and considered in my medical decision making (see chart for details). ? ? Low  back pain with left side sciatica, Urinary frequency.  Patient is currently on Bactrim that was prescribed on an e-visit for UTI symptoms; she has 2 doses left.  The dysuria she was having has resolved.  She

## 2021-06-10 ENCOUNTER — Telehealth: Payer: Medicaid Other | Admitting: Nurse Practitioner

## 2021-06-10 DIAGNOSIS — M5442 Lumbago with sciatica, left side: Secondary | ICD-10-CM | POA: Diagnosis not present

## 2021-06-10 MED ORDER — METHOCARBAMOL 500 MG PO TABS
500.0000 mg | ORAL_TABLET | Freq: Two times a day (BID) | ORAL | 0 refills | Status: DC | PRN
Start: 2021-06-10 — End: 2021-08-01

## 2021-06-10 MED ORDER — IBUPROFEN 600 MG PO TABS: 600.0000 mg | ORAL_TABLET | Freq: Four times a day (QID) | ORAL | 0 refills | Status: DC | PRN

## 2021-06-10 NOTE — Progress Notes (Signed)
We are sorry that you are not feeling well.  Here is how we plan to help! ? ?Based on what you have shared with me it looks like you mostly have acute back pain. ? ?Acute back pain is defined as musculoskeletal pain that can resolve in 1-3 weeks with conservative treatment. It looks like you were also treated for back pain in March. If your back pain continues you may likely need to be seen for a face to face visit with your PCP or need to get established with one for further evaluation.  ? ?I have prescribed Ibuprofen 600 mg take one by mouth three times a day as needed (non-steroid anti-inflammatory (NSAID) as well as methocarbamol which is a muscle relaxer.   Some patients experience stomach irritation or in increased heartburn with anti-inflammatory drugs.  Please keep in mind that muscle relaxer's can cause fatigue and should not be taken while at work or driving.  Back pain is very common.  The pain often gets better over time.  The cause of back pain is usually not dangerous.  Most people can learn to manage their back pain on their own. ? ?Home Care ?Stay active.  Start with short walks on flat ground if you can.  Try to walk farther each day. ?Do not sit, drive or stand in one place for more than 30 minutes.  Do not stay in bed. ?Do not avoid exercise or work.  Activity can help your back heal faster. ?Be careful when you bend or lift an object.  Bend at your knees, keep the object close to you, and do not twist. ?Sleep on a firm mattress.  Lie on your side, and bend your knees.  If you lie on your back, put a pillow under your knees. ?Only take medicines as told by your doctor. ?Put ice on the injured area. ?Put ice in a plastic bag ?Place a towel between your skin and the bag ?Leave the ice on for 15-20 minutes, 3-4 times a day for the first 2-3 days. 210 After that, you can switch between ice and heat packs. ?Ask your doctor about back exercises or massage. ?Avoid feeling anxious or stressed.  Find good  ways to deal with stress, such as exercise. ? ?Get Help Right Way If: ?Your pain does not go away with rest or medicine. ?Your pain does not go away in 1 week. ?You have new problems. ?You do not feel well. ?The pain spreads into your legs. ?You cannot control when you poop (bowel movement) or pee (urinate) ?You feel sick to your stomach (nauseous) or throw up (vomit) ?You have belly (abdominal) pain. ?You feel like you may pass out (faint). ?If you develop a fever. ? ?Make Sure you: ?Understand these instructions. ?Will watch your condition ?Will get help right away if you are not doing well or get worse. ? ?Your e-visit answers were reviewed by a board certified advanced clinical practitioner to complete your personal care plan.  Depending on the condition, your plan could have included both over the counter or prescription medications. ? ?If there is a problem please reply  once you have received a response from your provider. ? ?Your safety is important to [place].  If you have drug allergies check your prescription carefully.   ? ?You can use MyChart to ask questions about today?s visit, request a non-urgent call back, or ask for a work or school excuse for 24 hours related to this e-Visit. If it has been  greater than 24 hours you will need to follow up with your provider, or enter a new e-Visit to address those concerns. ? ?You will get an e-mail in the next two days asking about your experience.  I hope that your e-visit has been valuable and will speed your recovery. Thank you for using e-visits.  ?

## 2021-06-10 NOTE — Progress Notes (Signed)
I have spent 5 minutes in review of e-visit questionnaire, review and updating patient chart, medical decision making and response to patient.  ° °Charlii Yost W Kiaria Quinnell, NP ° °  °

## 2021-06-18 ENCOUNTER — Ambulatory Visit: Payer: Medicaid Other | Admitting: Internal Medicine

## 2021-06-21 ENCOUNTER — Telehealth: Payer: Medicaid Other | Admitting: Emergency Medicine

## 2021-06-21 DIAGNOSIS — N898 Other specified noninflammatory disorders of vagina: Secondary | ICD-10-CM

## 2021-06-22 NOTE — Progress Notes (Signed)
I am sorry are not feeling well. The symptoms you describe don't sound like a vaginal infection and don't quite sound like a urinary tract infection either. Based on what you shared with me, I feel your condition warrants further evaluation and I recommend that you be seen in a face to face visit either with a video visit through Virtual Urgent Care or at an in person urgent care. [facility] locations are listed below. Or you can get a Virtual Urgent Care visit through MyChart or through the [facility] Virtual Care website: [email or web address]    NOTE: There will be NO CHARGE for this eVisit   If you are having a true medical emergency please call 911.      For an urgent face to face visit, [place] has six urgent care centers for your convenience:     [facility] at [facility] Directions [phone] [address] [place], [place] [address]    [facility] Health Ur[facility] Care Center [facility]) Get Driving Directions [phone] [address] [place], [place] [address]   [facility] Health Urgent Care Center [facility] [place]) Get Driving Directions [phone] [address] [place],  [place]  [address]   [facility] Care at [facility] Get Driving Directions [phone] [address], [address] [place], [place] [address]    [facility] at [facility] Get Driving Directions  [phone] [address] [place], [place] [address]   [facility] Health Urgent Care at [facility] Directions [phone] [address] [place], [place] [address]   Your MyChart E-visit questionnaire answers were reviewed by a board certified advanced clinical practitioner to complete your personal care plan based on your specific symptoms.  Thank you for using e-Visits.

## 2021-06-23 ENCOUNTER — Telehealth: Payer: Medicaid Other | Admitting: Nurse Practitioner

## 2021-06-23 DIAGNOSIS — B3731 Acute candidiasis of vulva and vagina: Secondary | ICD-10-CM

## 2021-06-23 MED ORDER — FLUCONAZOLE 150 MG PO TABS: 150.0000 mg | ORAL_TABLET | Freq: Once | ORAL | 1 refills | Status: AC

## 2021-06-23 NOTE — Progress Notes (Signed)

## 2021-06-23 NOTE — Progress Notes (Signed)
I have spent 5 minutes in review of e-visit questionnaire, review and updating patient chart, medical decision making and response to patient.  ° °Smriti Barkow W Avenir Lozinski, NP ° °  °

## 2021-07-22 ENCOUNTER — Ambulatory Visit: Payer: Medicaid Other

## 2021-07-28 ENCOUNTER — Telehealth: Payer: Medicaid Other | Admitting: Nurse Practitioner

## 2021-07-28 DIAGNOSIS — J069 Acute upper respiratory infection, unspecified: Secondary | ICD-10-CM

## 2021-07-29 ENCOUNTER — Encounter: Payer: Self-pay | Admitting: Emergency Medicine

## 2021-07-29 ENCOUNTER — Emergency Department: Payer: Medicaid Other

## 2021-07-29 ENCOUNTER — Emergency Department
Admission: EM | Admit: 2021-07-29 | Discharge: 2021-07-30 | Disposition: A | Discharge status: Home / Self Care | Payer: Medicaid Other | Attending: Emergency Medicine | Admitting: Emergency Medicine

## 2021-07-29 ENCOUNTER — Other Ambulatory Visit: Payer: Self-pay

## 2021-07-29 DIAGNOSIS — N390 Urinary tract infection, site not specified: Secondary | ICD-10-CM

## 2021-07-29 DIAGNOSIS — E876 Hypokalemia: Secondary | ICD-10-CM | POA: Insufficient documentation

## 2021-07-29 DIAGNOSIS — Z3491 Encounter for supervision of normal pregnancy, unspecified, first trimester: Secondary | ICD-10-CM

## 2021-07-29 DIAGNOSIS — R109 Unspecified abdominal pain: Secondary | ICD-10-CM | POA: Diagnosis present

## 2021-07-29 DIAGNOSIS — K8020 Calculus of gallbladder without cholecystitis without obstruction: Secondary | ICD-10-CM

## 2021-07-29 DIAGNOSIS — K8050 Calculus of bile duct without cholangitis or cholecystitis without obstruction: Secondary | ICD-10-CM

## 2021-07-29 DIAGNOSIS — Z79899 Other long term (current) drug therapy: Secondary | ICD-10-CM | POA: Insufficient documentation

## 2021-07-29 DIAGNOSIS — O418X10 Other specified disorders of amniotic fluid and membranes, first trimester, not applicable or unspecified: Secondary | ICD-10-CM

## 2021-07-29 DIAGNOSIS — R1013 Epigastric pain: Secondary | ICD-10-CM

## 2021-07-29 LAB — CBC WITH DIFFERENTIAL/PLATELET
Abs Immature Granulocytes: 0.01 10*3/uL (ref 0.00–0.07)
Basophils Absolute: 0 10*3/uL (ref 0.0–0.1)
Basophils Relative: 0 %
Eosinophils Absolute: 0.1 10*3/uL (ref 0.0–0.5)
Eosinophils Relative: 1 %
HCT: 42.2 % (ref 36.0–46.0)
Hemoglobin: 13.9 g/dL (ref 12.0–15.0)
Immature Granulocytes: 0 %
Lymphocytes Relative: 41 %
Lymphs Abs: 2.6 10*3/uL (ref 0.7–4.0)
MCH: 30.3 pg (ref 26.0–34.0)
MCHC: 32.9 g/dL (ref 30.0–36.0)
MCV: 92.1 fL (ref 80.0–100.0)
Monocytes Absolute: 0.7 10*3/uL (ref 0.1–1.0)
Monocytes Relative: 10 %
Neutro Abs: 3 10*3/uL (ref 1.7–7.7)
Neutrophils Relative %: 48 %
Platelets: 202 10*3/uL (ref 150–400)
RBC: 4.58 MIL/uL (ref 3.87–5.11)
RDW: 13.2 % (ref 11.5–15.5)
WBC: 6.3 10*3/uL (ref 4.0–10.5)
nRBC: 0 % (ref 0.0–0.2)

## 2021-07-29 LAB — URINALYSIS, ROUTINE W REFLEX MICROSCOPIC
Bacteria, UA: NONE SEEN
Bilirubin Urine: NEGATIVE
Glucose, UA: NEGATIVE mg/dL
Ketones, ur: NEGATIVE mg/dL
Nitrite: NEGATIVE
Protein, ur: NEGATIVE mg/dL
Specific Gravity, Urine: 1.004 — ABNORMAL LOW (ref 1.005–1.030)
pH: 6 (ref 5.0–8.0)

## 2021-07-29 LAB — COMPREHENSIVE METABOLIC PANEL
ALT: 62 U/L — ABNORMAL HIGH (ref 0–44)
AST: 42 U/L — ABNORMAL HIGH (ref 15–41)
Albumin: 4 g/dL (ref 3.5–5.0)
Alkaline Phosphatase: 56 U/L (ref 38–126)
Anion gap: 5 (ref 5–15)
BUN: <5 mg/dL — ABNORMAL LOW (ref 6–20)
CO2: 24 mmol/L (ref 22–32)
Calcium: 9.3 mg/dL (ref 8.9–10.3)
Chloride: 109 mmol/L (ref 98–111)
Creatinine, Ser: 0.63 mg/dL (ref 0.44–1.00)
GFR, Estimated: >60 mL/min (ref >60)
Glucose, Bld: 93 mg/dL (ref 70–99)
Potassium: 3 mmol/L — ABNORMAL LOW (ref 3.5–5.1)
Sodium: 138 mmol/L (ref 135–145)
Total Bilirubin: 0.1 mg/dL — ABNORMAL LOW (ref 0.3–1.2)
Total Protein: 6.9 g/dL (ref 6.5–8.1)

## 2021-07-29 LAB — TROPONIN I (HIGH SENSITIVITY): Troponin I (High Sensitivity): <2 ng/L (ref <18)

## 2021-07-29 LAB — HCG, QUANTITATIVE, PREGNANCY: hCG, Beta Chain, Quant, S: 3828 m[IU]/mL — ABNORMAL HIGH (ref <5)

## 2021-07-29 LAB — LIPASE, BLOOD: Lipase: 23 U/L (ref 11–51)

## 2021-07-29 LAB — POC URINE PREG, ED: Preg Test, Ur: POSITIVE — AB

## 2021-07-29 MED ORDER — ACETAMINOPHEN 500 MG PO TABS
1000.0000 mg | ORAL_TABLET | Freq: Once | ORAL | Status: AC
  Administered 2021-07-30: 1000 mg via ORAL
  Filled 2021-07-29: qty 2

## 2021-07-30 ENCOUNTER — Emergency Department: Payer: Medicaid Other

## 2021-07-30 MED ORDER — OXYCODONE-ACETAMINOPHEN 5-325 MG PO TABS: 1.0000 | ORAL_TABLET | ORAL | 0 refills | Status: DC | PRN

## 2021-07-30 MED ORDER — POTASSIUM CHLORIDE CRYS ER 20 MEQ PO TBCR
40.0000 meq | EXTENDED_RELEASE_TABLET | Freq: Once | ORAL | Status: AC
  Administered 2021-07-30: 20 meq via ORAL
  Filled 2021-07-30: qty 2

## 2021-07-30 MED ORDER — FOSFOMYCIN TROMETHAMINE 3 G PO PACK
3.0000 g | PACK | Freq: Once | ORAL | Status: AC
  Administered 2021-07-30: 3 g via ORAL
  Filled 2021-07-30: qty 3

## 2021-07-30 MED ORDER — ONDANSETRON 4 MG PO TBDP: 4.0000 mg | ORAL_TABLET | Freq: Three times a day (TID) | ORAL | 0 refills | Status: DC | PRN

## 2021-08-01 ENCOUNTER — Telehealth: Payer: Medicaid Other | Admitting: Physician Assistant

## 2021-08-01 DIAGNOSIS — J029 Acute pharyngitis, unspecified: Secondary | ICD-10-CM

## 2021-08-01 NOTE — Progress Notes (Signed)
?  E-Visit for Sore Throat ? ?We are sorry that you are not feeling well.  Here is how we plan to help! ? ?Your symptoms indicate a likely viral infection (Pharyngitis).   Pharyngitis is inflammation in the back of the throat which can cause a sore throat, scratchiness and sometimes difficulty swallowing.   Pharyngitis is typically caused by a respiratory virus and will just run its course.  Please keep in mind that your symptoms could last up to 10 days.  For throat pain, we recommend over the counter oral pain relief medications such as acetaminophen or aspirin, or anti-inflammatory medications such as ibuprofen or naproxen sodium.  Topical treatments such as oral throat lozenges or sprays may be used as needed.  Avoid close contact with loved ones, especially the very young and elderly.  Remember to wash your hands thoroughly throughout the day as this is the number one way to prevent the spread of infection and wipe down door knobs and counters with disinfectant. ? ?After careful review of your answers, I would not recommend an antibiotic for your condition.  Antibiotics should not be used to treat conditions that we suspect are caused by viruses like the virus that causes the common cold or flu. However, some people can have Strep with atypical symptoms. You may need formal testing in clinic or office to confirm if your symptoms continue or worsen. ? ?Providers prescribe antibiotics to treat infections caused by bacteria. Antibiotics are very powerful in treating bacterial infections when they are used properly.  To maintain their effectiveness, they should be used only when necessary.  Overuse of antibiotics has resulted in the development of super bugs that are resistant to treatment!   ? ?Home Care: ?Only take medications as instructed by your medical team. ?Do not drink alcohol while taking these medications. ?A steam or ultrasonic humidifier can help congestion.  You can place a towel over your head and  breathe in the steam from hot water coming from a faucet. ?Avoid close contacts especially the very young and the elderly. ?Cover your mouth when you cough or sneeze. ?Always remember to wash your hands. ? ?Get Help Right Away If: ?You develop worsening fever or throat pain. ?You develop a severe head ache or visual changes. ?Your symptoms persist after you have completed your treatment plan. ? ?Make sure you ?Understand these instructions. ?Will watch your condition. ?Will get help right away if you are not doing well or get worse. ? ? ?Thank you for choosing an e-visit. ? ?Your e-visit answers were reviewed by a board certified advanced clinical practitioner to complete your personal care plan. Depending upon the condition, your plan could have included both over the counter or prescription medications. ? ?Please review your pharmacy choice. Make sure the pharmacy is open so you can pick up prescription now. If there is a problem, you may contact your provider through MyChart messaging and have the prescription routed to another pharmacy.  Your safety is important to us. If you have drug allergies check your prescription carefully.  ? ?For the next 24 hours you can use MyChart to ask questions about today's visit, request a non-urgent call back, or ask for a work or school excuse. ?You will get an email in the next two days asking about your experience. I hope that your e-visit has been valuable and will speed your recovery. ? ? ?I provided 5 minutes of non face-to-face time during this encounter for chart review and documentation.  ? ?

## 2021-08-03 IMAGING — CR DG CHEST 2V
2 series · 2 of 2 positions shown · non-contrast
Comparison: None.

CLINICAL DATA: Chest pain and cardiac arrhythmia

EXAM:
CHEST - 2 VIEW

[chest pa]
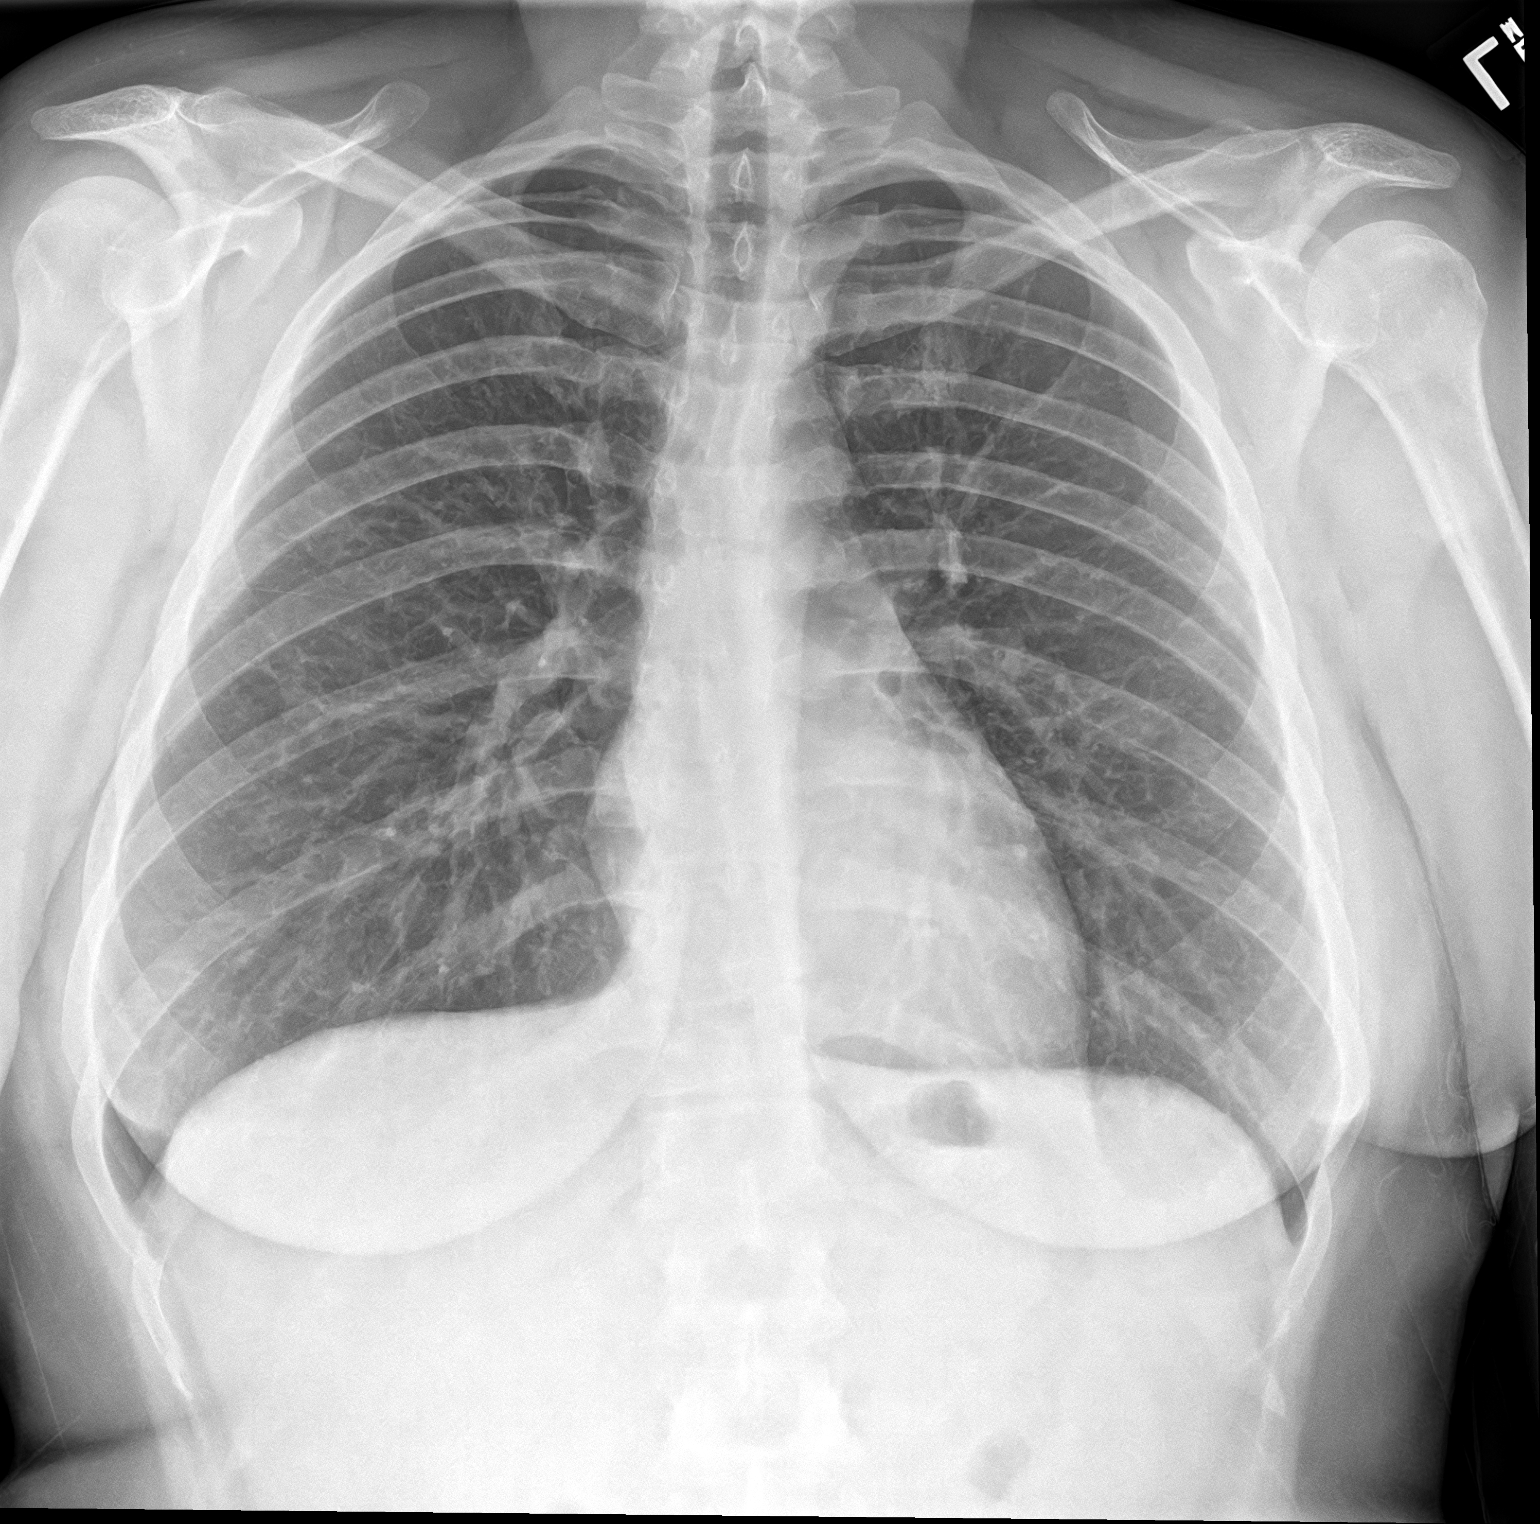

[chest lat]
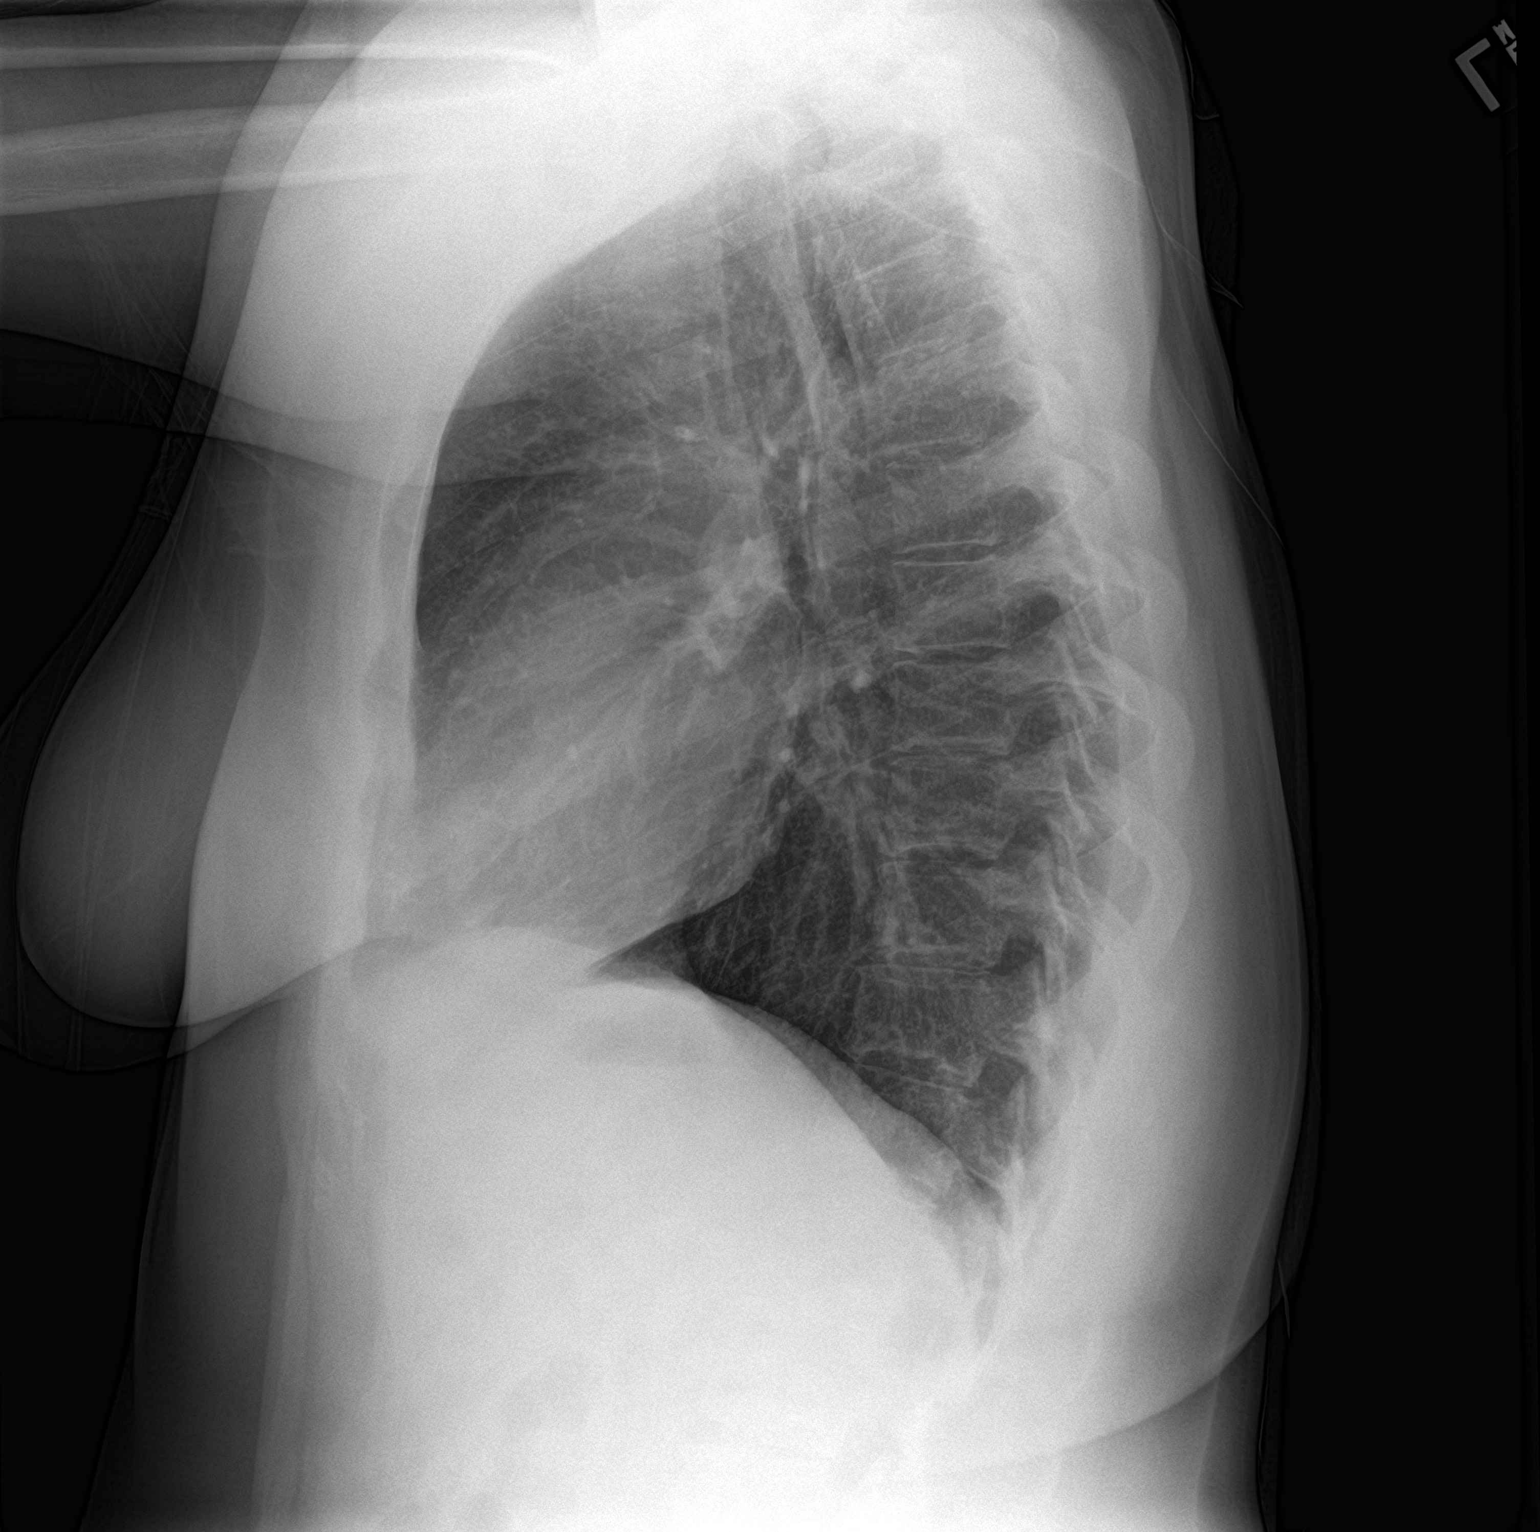

[2 of 2 positions shown; findings below may reference images not displayed]

FINDINGS: Lungs are clear. Heart size and pulmonary vascularity are normal. No
adenopathy. No pneumothorax. There is slight midthoracic
dextroscoliosis.
IMPRESSION: Lungs clear.  Cardiac silhouette within normal limits.

## 2021-08-04 ENCOUNTER — Telehealth: Payer: Medicaid Other | Admitting: Family

## 2021-08-04 DIAGNOSIS — B3731 Acute candidiasis of vulva and vagina: Secondary | ICD-10-CM

## 2021-08-05 MED ORDER — MICONAZOLE 3 4 % VA CREA: 1.0000 | TOPICAL_CREAM | Freq: Every day | VAGINAL | 0 refills | Status: DC

## 2021-08-05 NOTE — Progress Notes (Signed)

## 2021-08-13 ENCOUNTER — Encounter: Payer: Self-pay | Admitting: Student

## 2021-08-13 ENCOUNTER — Ambulatory Visit (INDEPENDENT_AMBULATORY_CARE_PROVIDER_SITE_OTHER): Payer: Medicaid Other | Admitting: Student

## 2021-08-13 VITALS — BP 129/82 | HR 98 | Wt 220.7 lb

## 2021-08-13 DIAGNOSIS — Z3A01 Less than 8 weeks gestation of pregnancy: Secondary | ICD-10-CM

## 2021-08-13 DIAGNOSIS — Z331 Pregnant state, incidental: Secondary | ICD-10-CM

## 2021-08-13 DIAGNOSIS — Z7689 Persons encountering health services in other specified circumstances: Secondary | ICD-10-CM | POA: Diagnosis not present

## 2021-08-13 DIAGNOSIS — Z3481 Encounter for supervision of other normal pregnancy, first trimester: Secondary | ICD-10-CM

## 2021-08-13 DIAGNOSIS — O468X1 Other antepartum hemorrhage, first trimester: Secondary | ICD-10-CM

## 2021-08-13 DIAGNOSIS — O418X10 Other specified disorders of amniotic fluid and membranes, first trimester, not applicable or unspecified: Secondary | ICD-10-CM | POA: Diagnosis not present

## 2021-08-13 NOTE — Progress Notes (Signed)
  History:  [name] is a 31 y.o. [id number] who presents to clinic today for follow up from ED visit on 07/30/2021 after experiencing abdominal pain. She was found to be pregnant at that visit; she was told to follow-up at an OB-GYN.   She is well-feeling; no complaints.  She reports that her abdominal pain is resolved and that she has no cramping, bleeding, dysuria or vaginal discharge.   The following portions of the patient's history were reviewed and updated as appropriate: allergies, current medications, family history, past medical history, social history, past surgical history and problem list.  Review of Systems:  Review of Systems  Constitutional: Negative.   HENT: Negative.    Gastrointestinal: Negative.   Genitourinary: Negative.   Skin: Negative.   Neurological: Negative.   Endo/Heme/Allergies: Negative.       Objective:  Physical Exam BP 129/82   Pulse 98   Wt 220 lb 11.2 oz (100.1 kg)   LMP 06/22/2021 (Exact Date)   BMI 34.57 kg/m  Physical Exam Constitutional:      Appearance: Normal appearance.  HENT:     Head: Normocephalic.  Cardiovascular:     Rate and Rhythm: Normal rate.  Musculoskeletal:        General: Normal range of motion.  Skin:    General: Skin is warm and dry.  Neurological:     General: No focal deficit present.     Mental Status: She is alert.  Psychiatric:        Mood and Affect: Mood normal.       Labs and Imaging No results found for this or any previous visit (from the past 24 hour(s)).  No results found.  Health Maintenance Due  Topic Date Due   COVID-19 Vaccine (1) Never done   Hepatitis C Screening  Never done   PAP SMEAR-Modifier  05/02/2018   PAP-Cervical Cytology Screening  05/01/2020    Labs, imaging and previous visits in Epic and Care Everywhere reviewed  Assessment & Plan:  1. Establishing care with new doctor, encounter for -patient is well-appearing, excited about being pregnant. Will defer labs, pap,  NOB intake for future appointments.   2. Encounter for supervision of other normal pregnancy in first trimester -viability scan ordered - US OB Comp Less 14 Wks; Future  3. Subchorionic hemorrhage of placenta in first trimester, single or unspecified fetus  - US OB Comp Less 14 Wks; Future  4. [redacted] weeks gestation of pregnancy  - US OB Comp Less 14 Wks; Future    Approximately 15 minutes of total time was spent with this patient on care and exam.   Marylene Land, CNM 08/13/2021 1:25 PM

## 2021-08-15 ENCOUNTER — Telehealth: Payer: Medicaid Other | Admitting: Physician Assistant

## 2021-08-15 DIAGNOSIS — R350 Frequency of micturition: Secondary | ICD-10-CM

## 2021-08-15 DIAGNOSIS — R102 Pelvic and perineal pain: Secondary | ICD-10-CM

## 2021-08-15 NOTE — Progress Notes (Signed)
For the safety of you and your child, I recommend a face to face office visit with a health care provider.  Many mothers need to take medicines during their pregnancy and while nursing.  Almost all medicines pass into the breast milk in small quantities.  Most are generally considered safe for a mother to take but some medicines must be avoided.  After reviewing your E-Visit request, I recommend that you consult your OB/GYN or pediatrician for medical advice in relation to your condition and prescription medications while pregnant or breastfeeding.  NOTE:  There will be NO CHARGE for this eVisit  If you are having a true medical emergency please call 911.    For an urgent face to face visit, [place] has six urgent care centers for your convenience:     [facility] [place] Get Driving Directions [phone] [address] , [place] [address]    [facility] ([place]) Get Driving Directions [phone] [address] [place], [place] [address]  [facility] ([place] [facility] [place]) Get Driving Directions [phone] [address] [place],  [place]  [address]  [facility] Get Driving Directions [phone] [address] [place], [place] [address]   [facility] [place] Get Driving Directions  [phone] [address] [place], [place] [address]   [facility] [place] Get Driving Directions [phone] [address] ,  [address]  Your MyChart E-visit questionnaire answers were reviewed by a board certified advanced clinical practitioner to complete your personal care plan based on your specific symptoms.  Thank you for using e-Visits.   I provided 5 minutes of non face-to-face time during this encounter for chart review and documentation.   

## 2021-08-17 ENCOUNTER — Encounter: Payer: Self-pay | Admitting: Obstetrics

## 2021-08-17 ENCOUNTER — Ambulatory Visit (INDEPENDENT_AMBULATORY_CARE_PROVIDER_SITE_OTHER): Payer: Medicaid Other | Admitting: Obstetrics

## 2021-08-17 VITALS — BP 137/84 | HR 102 | Wt 218.0 lb

## 2021-08-17 DIAGNOSIS — R1030 Lower abdominal pain, unspecified: Secondary | ICD-10-CM

## 2021-08-17 DIAGNOSIS — F17210 Nicotine dependence, cigarettes, uncomplicated: Secondary | ICD-10-CM | POA: Diagnosis not present

## 2021-08-17 DIAGNOSIS — Z3A08 8 weeks gestation of pregnancy: Secondary | ICD-10-CM

## 2021-08-17 DIAGNOSIS — R399 Unspecified symptoms and signs involving the genitourinary system: Secondary | ICD-10-CM | POA: Diagnosis not present

## 2021-08-17 MED ORDER — NICOTINE 7 MG/24HR TD PT24: 7.0000 mg | MEDICATED_PATCH | Freq: Every day | TRANSDERMAL | 0 refills | Status: DC

## 2021-08-17 MED ORDER — NICOTINE 14 MG/24HR TD PT24: 14.0000 mg | MEDICATED_PATCH | Freq: Every day | TRANSDERMAL | 0 refills | Status: DC

## 2021-08-17 NOTE — Progress Notes (Signed)
GYN ENCOUNTER  Subjective  HPI: Jasmine Gray is a 31 y.o. [id number] at [redacted]w[redacted]d gestation who presents today for evaluation of lowe abdominal discomfort. She reports that she has had pain in her side when she urinates. It happened for two days but has since resolved. She denies dysuria, vaginal irritation, back/flank pain, suprapubic pain, hematuria, and other UTI symptoms. She has no history of kidney stones. She had UTIs in the past. She would also like to discuss nicotine patches for smoking cessation.  Past Medical History:  Diagnosis Date   Acid reflux    on no RX at present   Hemorrhoid 11/2015   starting on past Sat. Pt states it is on the outside   Tobacco abuse    Past Surgical History:  Procedure Laterality Date   NO PAST SURGERIES     OB History     Gravida  4   Para  2   Term  1   Preterm  1   AB  1   Living  2      SAB  1   IAB      Ectopic      Multiple  0   Live Births  2        Obstetric Comments  States at beginning of pregnancy felt light headed and ill, resolved        Allergies  Allergen Reactions   Penicillins Hives   ROS: Negative except as noted in HPI History obtained from the patient  Objective  BP 137/84   Pulse (!) 102   Wt 218 lb (98.9 kg)   LMP 06/22/2021 (Exact Date)   BMI 34.14 kg/m  UA unremarkable.  General appearance: alert, cooperative Physical exam not indicated Assessment 1) Round ligament pain 2) Desires smoking cessation  Plan 1) Urine culture sent but pain has resolved and Romie's description sounds more like round ligament pain. Discussed comfort measures. 2) Nicotine patches sent. Proper use discussed.  Return for scheduled NOB visit or PRN.   Guadlupe Spanish, CNM

## 2021-08-19 LAB — URINE CULTURE

## 2021-08-21 ENCOUNTER — Ambulatory Visit
Admission: RE | Admit: 2021-08-21 | Discharge: 2021-08-21 | Disposition: A | Discharge status: Home / Self Care | Payer: Medicaid Other | Source: Ambulatory Visit | Attending: Student | Admitting: Student

## 2021-08-21 DIAGNOSIS — Z3A08 8 weeks gestation of pregnancy: Secondary | ICD-10-CM | POA: Insufficient documentation

## 2021-08-21 DIAGNOSIS — Z3687 Encounter for antenatal screening for uncertain dates: Secondary | ICD-10-CM | POA: Insufficient documentation

## 2021-08-21 DIAGNOSIS — Z3A01 Less than 8 weeks gestation of pregnancy: Secondary | ICD-10-CM | POA: Insufficient documentation

## 2021-08-21 DIAGNOSIS — Z3481 Encounter for supervision of other normal pregnancy, first trimester: Secondary | ICD-10-CM | POA: Insufficient documentation

## 2021-08-21 DIAGNOSIS — O418X10 Other specified disorders of amniotic fluid and membranes, first trimester, not applicable or unspecified: Secondary | ICD-10-CM | POA: Insufficient documentation

## 2021-08-21 DIAGNOSIS — O468X1 Other antepartum hemorrhage, first trimester: Secondary | ICD-10-CM | POA: Insufficient documentation

## 2021-08-23 ENCOUNTER — Ambulatory Visit (INDEPENDENT_AMBULATORY_CARE_PROVIDER_SITE_OTHER): Payer: Medicaid Other

## 2021-08-23 DIAGNOSIS — O0990 Supervision of high risk pregnancy, unspecified, unspecified trimester: Secondary | ICD-10-CM | POA: Insufficient documentation

## 2021-08-23 DIAGNOSIS — Z3480 Encounter for supervision of other normal pregnancy, unspecified trimester: Secondary | ICD-10-CM

## 2021-08-23 DIAGNOSIS — Z3A00 Weeks of gestation of pregnancy not specified: Secondary | ICD-10-CM

## 2021-08-23 NOTE — Progress Notes (Signed)
New OB Intake  I connected with  Angie Fava on 08/23/21 at 10:15 AM EDT by telephone Video Visit and verified that I am speaking with the correct person using two identifiers. Nurse is located at [facility] and pt is located at home.  I explained I am completing New OB Intake today. We discussed her EDD of 03/29/2022 that is based on LMP of 06/22/2021. Pt is G1/P1112. I reviewed her allergies, medications, Medical/Surgical/OB history, and appropriate screenings. Based on history, this is a/an pregnancy uncomplicated .   Patient Active Problem List   Diagnosis Date Noted   Supervision of other normal pregnancy, antepartum 08/23/2021   UTI (urinary tract infection) 04/28/2021   History of premature delivery 04/29/2016   Overweight (BMI 25.0-29.9) 07/28/2015   Tobacco user 05/31/2014    Concerns addressed today Pt inquired about subchorionic hemorrhage; adv it is small and not to worry about it; to d/w provider at NOB appt.  Delivery Plans:  Plans to deliver at [facility].  Anatomy US Explained first scheduled [place] will be around 20 weeks.   Labs Discussed genetic screening with patient. Patient desires genetic testing to be drawn with new OB labs. Discussed possible labs to be drawn at new OB appointment.  COVID Vaccine Patient has not had COVID vaccine.   Social Determinants of Health Food Insecurity: denies food insecurity Transportation: Patient denies transportation needs. Childcare: Discussed no children allowed at ultrasound appointments.   First visit review I reviewed new OB appt with pt. I explained she will have ob bloodwork and pap smear/pelvic exam if indicated. Explained pt will be seen by Hildred Laser, MD at first visit; encounter routed to appropriate provider.   Loran Senters, [facility] 08/23/2021  10:34 AM  Clinical Staff Provider  Office Location  [facility] Dating    Language  English Anatomy US    Flu Vaccine  offer Genetic  Screen  NIPS:   TDaP vaccine   offer Hgb A1C or  GTT Early : Third trimester :   Covid declines   LAB RESULTS   Rhogam   Blood Type     Feeding Plan breast Antibody    Contraception depo Rubella    Circumcision no RPR     Pediatrician  [facility] HBsAg     Support Person Devaris; sister HIV    Prenatal Classes yes Varicella     GBS  (For PCN allergy, check sensitivities)   BTL Consent  Hep C   VBAC Consent  Pap      Hgb Electro      CF      SMA

## 2021-08-24 ENCOUNTER — Telehealth: Payer: Medicaid Other | Admitting: Physician Assistant

## 2021-08-24 DIAGNOSIS — O26891 Other specified pregnancy related conditions, first trimester: Secondary | ICD-10-CM

## 2021-08-24 DIAGNOSIS — Z3A09 9 weeks gestation of pregnancy: Secondary | ICD-10-CM

## 2021-08-24 DIAGNOSIS — R3989 Other symptoms and signs involving the genitourinary system: Secondary | ICD-10-CM

## 2021-08-24 NOTE — Progress Notes (Signed)
For the safety of you and your child, I recommend a face to face office visit with a health care provider.  Many mothers need to take medicines during their pregnancy and while nursing.  Almost all medicines pass into the breast milk in small quantities.  Most are generally considered safe for a mother to take but some medicines must be avoided.  After reviewing your E-Visit request, I recommend that you consult your OB/GYN or pediatrician for medical advice in relation to your condition and prescription medications while pregnant or breastfeeding.  NOTE:  There will be NO CHARGE for this eVisit  If you are having a true medical emergency please call 911.    For an urgent face to face visit, [place] has six urgent care centers for your convenience:     [facility] [place] Get Driving Directions [phone] [address] [place], [place] [address]    [facility] ([place]) Get Driving Directions [phone] [address] [place], [place] [address]  [facility] [facility] [place]) Get Driving Directions [phone] [address] [place],  [place]  [address]  [facility] Get Driving Directions [phone] [address] [place], [place] [address]   [facility] [place] Get Driving Directions  [phone] [address] [place], [place] [address]   [facility] [place] Get Driving Directions [phone] [address] [place], [place] [address]  Your MyChart E-visit questionnaire answers were reviewed by a board certified advanced clinical practitioner to complete your personal care plan based on your specific symptoms.  Thank you for using e-Visits.   I provided 5 minutes of non face-to-face time during this encounter for chart review and documentation.   

## 2021-08-31 ENCOUNTER — Other Ambulatory Visit: Payer: Medicaid Other

## 2021-09-03 ENCOUNTER — Other Ambulatory Visit: Payer: Medicaid Other

## 2021-09-03 DIAGNOSIS — Z3A10 10 weeks gestation of pregnancy: Secondary | ICD-10-CM

## 2021-09-03 DIAGNOSIS — Z0189 Encounter for other specified special examinations: Secondary | ICD-10-CM

## 2021-09-03 DIAGNOSIS — Z113 Encounter for screening for infections with a predominantly sexual mode of transmission: Secondary | ICD-10-CM

## 2021-09-03 DIAGNOSIS — Z3480 Encounter for supervision of other normal pregnancy, unspecified trimester: Secondary | ICD-10-CM

## 2021-09-03 DIAGNOSIS — Z0283 Encounter for blood-alcohol and blood-drug test: Secondary | ICD-10-CM

## 2021-09-04 LAB — MICROSCOPIC EXAMINATION
Bacteria, UA: NONE SEEN
Casts: NONE SEEN /lpf
RBC, Urine: NONE SEEN /hpf (ref 0–2)

## 2021-09-04 LAB — URINALYSIS, ROUTINE W REFLEX MICROSCOPIC
Bilirubin, UA: NEGATIVE
Glucose, UA: NEGATIVE
Ketones, UA: NEGATIVE
Nitrite, UA: NEGATIVE
Protein,UA: NEGATIVE
RBC, UA: NEGATIVE
Specific Gravity, UA: 1.009 (ref 1.005–1.030)
Urobilinogen, Ur: 0.2 mg/dL (ref 0.2–1.0)
pH, UA: 6 (ref 5.0–7.5)

## 2021-09-04 LAB — CBC/D/PLT+RPR+RH+ABO+RUBIGG...
Antibody Screen: NEGATIVE
Basophils Absolute: 0 10*3/uL (ref 0.0–0.2)
Basos: 0 %
EOS (ABSOLUTE): 0.1 10*3/uL (ref 0.0–0.4)
Eos: 1 %
HCV Ab: NONREACTIVE
HIV Screen 4th Generation wRfx: NONREACTIVE
Hematocrit: 42.7 % (ref 34.0–46.6)
Hemoglobin: 14.6 g/dL (ref 11.1–15.9)
Hepatitis B Surface Ag: NEGATIVE
Immature Grans (Abs): 0 10*3/uL (ref 0.0–0.1)
Immature Granulocytes: 0 %
Lymphocytes Absolute: 1.8 10*3/uL (ref 0.7–3.1)
Lymphs: 31 %
MCH: 31.7 pg (ref 26.6–33.0)
MCHC: 34.2 g/dL (ref 31.5–35.7)
MCV: 93 fL (ref 79–97)
Monocytes Absolute: 0.4 10*3/uL (ref 0.1–0.9)
Monocytes: 7 %
Neutrophils Absolute: 3.6 10*3/uL (ref 1.4–7.0)
Neutrophils: 61 %
Platelets: 203 10*3/uL (ref 150–450)
RBC: 4.6 x10E6/uL (ref 3.77–5.28)
RDW: 13.9 % (ref 11.7–15.4)
RPR Ser Ql: NONREACTIVE
Rh Factor: POSITIVE
Rubella Antibodies, IGG: 2.88 index (ref >0.99)
Varicella zoster IgG: 199 index (ref >165)
WBC: 5.9 10*3/uL (ref 3.4–10.8)

## 2021-09-04 LAB — PAIN MGT SCRN (14 DRUGS), UR
Amphetamine Scrn, Ur: NEGATIVE ng/mL
BARBITURATE SCREEN URINE: NEGATIVE ng/mL
BENZODIAZEPINE SCREEN, URINE: NEGATIVE ng/mL
Buprenorphine, Urine: NEGATIVE ng/mL
CANNABINOIDS UR QL SCN: NEGATIVE ng/mL
Cocaine (Metab) Scrn, Ur: NEGATIVE ng/mL
Creatinine(Crt), U: 64.9 mg/dL (ref 20.0–300.0)
Fentanyl, Urine: NEGATIVE pg/mL
Meperidine Screen, Urine: NEGATIVE ng/mL
Methadone Screen, Urine: NEGATIVE ng/mL
OXYCODONE+OXYMORPHONE UR QL SCN: NEGATIVE ng/mL
Opiate Scrn, Ur: NEGATIVE ng/mL
Ph of Urine: 5.6 (ref 4.5–8.9)
Phencyclidine Qn, Ur: NEGATIVE ng/mL
Propoxyphene Scrn, Ur: NEGATIVE ng/mL
Tramadol Screen, Urine: NEGATIVE ng/mL

## 2021-09-04 LAB — NICOTINE SCREEN, URINE: Cotinine Ql Scrn, Ur: POSITIVE ng/mL — AB

## 2021-09-04 LAB — HCV INTERPRETATION

## 2021-09-05 LAB — GC/CHLAMYDIA PROBE AMP
Chlamydia trachomatis, NAA: NEGATIVE
Neisseria Gonorrhoeae by PCR: NEGATIVE

## 2021-09-05 LAB — CULTURE, OB URINE

## 2021-09-05 LAB — URINE CULTURE, OB REFLEX

## 2021-09-06 ENCOUNTER — Encounter: Payer: Medicaid Other | Admitting: Obstetrics and Gynecology

## 2021-09-06 ENCOUNTER — Encounter: Payer: Self-pay | Admitting: Obstetrics and Gynecology

## 2021-09-09 LAB — MATERNIT 21 PLUS CORE, BLOOD
Fetal Fraction: 4
Result (T21): NEGATIVE
Trisomy 13 (Patau syndrome): NEGATIVE
Trisomy 18 (Edwards syndrome): NEGATIVE
Trisomy 21 (Down syndrome): NEGATIVE

## 2021-09-11 ENCOUNTER — Telehealth: Payer: Self-pay | Admitting: Obstetrics and Gynecology

## 2021-09-11 NOTE — Telephone Encounter (Signed)
Patient is calling wishing to be seen today. Patient reports she is having clear discharge and lower back pain. Please advise?

## 2021-09-12 ENCOUNTER — Telehealth: Payer: Medicaid Other | Admitting: Physician Assistant

## 2021-09-12 DIAGNOSIS — N898 Other specified noninflammatory disorders of vagina: Secondary | ICD-10-CM

## 2021-09-12 NOTE — Progress Notes (Signed)
For the safety of you and your child, I recommend a face to face office visit with a health care provider.  Many mothers need to take medicines during their pregnancy and while nursing.  Almost all medicines pass into the breast milk in small quantities.  Most are generally considered safe for a mother to take but some medicines must be avoided.  After reviewing your E-Visit request, I recommend that you consult your OB/GYN or pediatrician for medical advice in relation to your condition and prescription medications while pregnant or breastfeeding.  Even topical medications for yeast infection should only be used when absolutely necessary during the first trimester of pregnancy. I would recommend to be seen for a vaginal swab to confirm.   NOTE:  There will be NO CHARGE for this eVisit  If you are having a true medical emergency please call 911.    For an urgent face to face visit, [place] has six urgent care centers for your convenience:     [facility] at [facility] Directions [phone] [address] [place], [place] [address]    [facility]) Get Driving Directions [phone] [address] [place], [place] [address]  [facility] Health Urgent Care Center [facility] [place]) Get Driving Directions [phone] [address] [place],  [place]  [address]  [facility] Health Urgent Care at [facility] Get Driving Directions [phone] [address] [place], [place] [address]   [address] [facility] Health Urgent Care at [facility] Get Driving Directions  [phone] [address] [place], [place] [address]   [facility] Health Urgent Care at [facility] Directions [phone] [address] [place], [place] [address]  Your MyChart E-visit questionnaire answers were reviewed by a board certified advanced clinical practitioner to complete your  personal care plan based on your specific symptoms.  Thank you for using e-Visits.   I provided 5 minutes of non face-to-face time during this encounter for chart review and documentation.

## 2021-09-13 ENCOUNTER — Ambulatory Visit: Payer: Medicaid Other

## 2021-09-13 ENCOUNTER — Ambulatory Visit
Admission: RE | Admit: 2021-09-13 | Discharge: 2021-09-13 | Disposition: A | Discharge status: Home / Self Care | Payer: Medicaid Other | Source: Ambulatory Visit | Attending: Emergency Medicine | Admitting: Emergency Medicine

## 2021-09-13 VITALS — BP 137/83 | HR 97 | Temp 98.2°F | Resp 16

## 2021-09-13 DIAGNOSIS — R300 Dysuria: Secondary | ICD-10-CM | POA: Diagnosis present

## 2021-09-13 DIAGNOSIS — Z3A11 11 weeks gestation of pregnancy: Secondary | ICD-10-CM | POA: Insufficient documentation

## 2021-09-13 DIAGNOSIS — O98811 Other maternal infectious and parasitic diseases complicating pregnancy, first trimester: Secondary | ICD-10-CM | POA: Insufficient documentation

## 2021-09-13 DIAGNOSIS — R350 Frequency of micturition: Secondary | ICD-10-CM | POA: Diagnosis present

## 2021-09-13 DIAGNOSIS — B3731 Acute candidiasis of vulva and vagina: Secondary | ICD-10-CM | POA: Insufficient documentation

## 2021-09-13 LAB — POCT URINALYSIS DIP (MANUAL ENTRY)
Bilirubin, UA: NEGATIVE
Blood, UA: NEGATIVE
Glucose, UA: NEGATIVE mg/dL
Ketones, POC UA: NEGATIVE mg/dL
Nitrite, UA: NEGATIVE
Protein Ur, POC: NEGATIVE mg/dL
Spec Grav, UA: >=1.03 — AB (ref 1.010–1.025)
Urobilinogen, UA: 0.2 E.U./dL
pH, UA: 6.5 (ref 5.0–8.0)

## 2021-09-13 MED ORDER — CEPHALEXIN 500 MG PO CAPS: 500.0000 mg | ORAL_CAPSULE | Freq: Two times a day (BID) | ORAL | 0 refills | Status: AC

## 2021-09-13 NOTE — ED Provider Notes (Signed)
[facility]    CSN: [id number] Arrival date & time: 09/13/21  1012      History   Chief Complaint Chief Complaint  Patient presents with   Dysuria   Vaginal Discharge   Vaginal Itching    HPI Jasmine Gray is a 31 y.o. female.   Patient presents with clear vaginal discharge with fishy odor, vaginal itching, irritation, dysuria, urinary frequency and urgency for 3 days.  Has not attempted treatment of symptoms.  Denies hematuria or vaginal spotting, lower abdominal pain or pressure, flank pain, new rash or lesions, fever or chills.  Currently [redacted] weeks pregnant.  Past Medical History:  Diagnosis Date   Acid reflux    on no RX at present   Hemorrhoid 11/2015   starting on past Sat. Pt states it is on the outside   Tobacco abuse     Patient Active Problem List   Diagnosis Date Noted   Supervision of other normal pregnancy, antepartum 08/23/2021   UTI (urinary tract infection) 04/28/2021   History of premature delivery 04/29/2016   Overweight (BMI 25.0-29.9) 07/28/2015   Tobacco user 05/31/2014    Past Surgical History:  Procedure Laterality Date   NO PAST SURGERIES      OB History     Gravida  4   Para  2   Term  1   Preterm  1   AB  1   Living  2      SAB  1   IAB      Ectopic      Multiple  0   Live Births  2        Obstetric Comments  States at beginning of pregnancy felt light headed and ill, resolved          Home Medications    Prior to Admission medications   Medication Sig Start Date End Date Taking? Authorizing Provider  nicotine (NICODERM CQ - DOSED IN MG/24 HOURS) 14 mg/24hr patch Place 1 patch (14 mg total) onto the skin daily. Use one patch daily for 4 weeks 08/17/21   Glenetta Borg, CNM  Prenatal Vit-Fe Fumarate-FA (PRENATAL VITAMINS) 28-0.8 MG TABS Take 1 tablet by mouth daily.    [provider]  albuterol (VENTOLIN HFA) 108 (90 Base) MCG/ACT inhaler Inhale 2 puffs into the lungs every 6 (six)  hours as needed for wheezing or shortness of breath. 02/14/20 04/22/20  Junie Spencer, FNP  fluticasone (FLONASE) 50 MCG/ACT nasal spray Place 2 sprays into both nostrils daily. 02/14/20 04/22/20  Junie Spencer, FNP  metoprolol tartrate (LOPRESSOR) 25 MG tablet Take 0.5 tablets (12.5 mg total) by mouth 2 (two) times daily as needed (Palpitations). Patient not taking: Reported on 07/08/2019 03/29/19 04/22/20  Shaune Pollack, MD  omeprazole (PRILOSEC) 20 MG capsule Take 1 capsule (20 mg total) by mouth daily. 07/17/19 04/22/20  McVey, Madelaine Bhat, PA-C    Family History Family History  Problem Relation Age of Onset   Hypertension Mother    Migraines Mother    Heart disease Father    Hypertension Father    Congestive Heart Failure Father    Stroke Father    Factor V Leiden deficiency Father    Cancer Maternal Grandmother 13       breast   Alcohol abuse Maternal Grandfather    Hypertension Paternal Grandfather    Depression Neg Hx     Social History Social History   Tobacco Use   Smoking status:  Every Day    Packs/day: 0.25    Years: 5.00    Total pack years: 1.25    Types: Cigarettes   Smokeless tobacco: Never   Tobacco comments:    On nicotine patch  Vaping Use   Vaping Use: Never used  Substance Use Topics   Alcohol use: No   Drug use: No     Allergies   Penicillins   Review of Systems Review of Systems  Constitutional: Negative.   Respiratory: Negative.    Genitourinary:  Positive for dysuria, frequency, urgency and vaginal discharge. Negative for decreased urine volume, difficulty urinating, dyspareunia, enuresis, flank pain, genital sores, hematuria, menstrual problem, pelvic pain, vaginal bleeding and vaginal pain.  Musculoskeletal: Negative.   Skin: Negative.      Physical Exam Triage Vital Signs ED Triage Vitals  Enc Vitals Group     BP 09/13/21 1038 137/83     Pulse Rate 09/13/21 1038 97     Resp 09/13/21 1038 16     Temp 09/13/21 1038 98.2 F  (36.8 C)     Temp Source 09/13/21 1038 Temporal     SpO2 09/13/21 1038 99 %     Weight --      Height --      Head Circumference --      Peak Flow --      Pain Score 09/13/21 1032 0     Pain Loc --      Pain Edu? --      Excl. in GC? --    No data found.  Updated Vital Signs BP 137/83 (BP Location: Left Arm)   Pulse 97   Temp 98.2 F (36.8 C) (Temporal)   Resp 16   LMP 06/22/2021 (Exact Date)   SpO2 99%   Visual Acuity Right Eye Distance:   Left Eye Distance:   Bilateral Distance:    Right Eye Near:   Left Eye Near:    Bilateral Near:     Physical Exam Constitutional:      Appearance: Normal appearance.  HENT:     Head: Normocephalic.  Eyes:     Extraocular Movements: Extraocular movements intact.  Abdominal:     General: Abdomen is flat. Bowel sounds are normal.     Palpations: Abdomen is soft.     Tenderness: There is no abdominal tenderness. There is no right CVA tenderness or left CVA tenderness.  Skin:    General: Skin is warm and dry.  Neurological:     Mental Status: She is alert and oriented to person, place, and time. Mental status is at baseline.  Psychiatric:        Mood and Affect: Mood normal.        Behavior: Behavior normal.      UC Treatments / Results  Labs (all labs ordered are listed, but only abnormal results are displayed) Labs Reviewed  POCT URINALYSIS DIP (MANUAL ENTRY)    EKG   Radiology No results found.  Procedures Procedures (including critical care time)  Medications Ordered in UC Medications - No data to display  Initial Impression / Assessment and Plan / UC Course  I have reviewed the triage vital signs and the nursing notes.  Pertinent labs & imaging results that were available during my care of the patient were reviewed by me and considered in my medical decision making (see chart for details).  Urinary frequency, dysuria  Urinalysis showing large amount of [name] blood cells which is an increase from 1  week ago, negative for nitrates, sent for culture, discussed findings with patient, as she is pregnant and symptomatic we will begin prophylactic treatment, Keflex 5-day course prescribed, low risk penicillin allergy confirmed, STI labs checking for yeast and bacterial vaginosis are pending, will treat further per protocol, advised good hygiene and increase fluid intake for additional supportive measures, given strict precautions for persisting or reoccurring symptoms to follow-up with urgent care or obstetrician for reevaluation Final Clinical Impressions(s) / UC Diagnoses   Final diagnoses:  None   Discharge Instructions   None    ED Prescriptions   None    PDMP not reviewed this encounter.   Valinda Hoar, NP 09/13/21 1059

## 2021-09-13 NOTE — ED Triage Notes (Signed)
Patient presents to UC for vaginal itching, discharge, and dysuria x 3 days ago. She states she was seen her provider and urine was obtained but staff has not called her about her results. She is concerned with UTI. Has a hx of bv and yeast.

## 2021-09-13 NOTE — Discharge Instructions (Addendum)
Your urinalysis shows Jasmine Gray blood cells but did not show nitrates which is an enzyme released from bacteria, therefore your urine has been sent to the lab to determine if bacteria will grow if any changes need to be made to your medications you will be notified  Your vaginal swab checking for yeast and bacterial vaginosis is pending, you will be notified of positive test results only and at time of notification medication sent to pharmacy  Begin use of Keflex every morning and every evening for 5 days  Increase your fluid intake through use of water  As always practice good hygiene, wiping front to back and avoidance of scented vaginal products to prevent further irritation  If symptoms continue to persist after use of medication or recur please follow-up with urgent care or your primary doctor as needed

## 2021-09-14 LAB — URINE CULTURE

## 2021-09-14 LAB — CERVICOVAGINAL ANCILLARY ONLY
Bacterial Vaginitis (gardnerella): POSITIVE — AB
Candida Glabrata: NEGATIVE
Candida Vaginitis: POSITIVE — AB
Comment: NEGATIVE
Comment: NEGATIVE
Comment: NEGATIVE

## 2021-09-14 NOTE — Telephone Encounter (Signed)
I tried to call patient yesterday. Her boyfriend answered the phone. He reported that she is feeling better and he would have her call the office.

## 2021-09-15 ENCOUNTER — Telehealth: Payer: Self-pay

## 2021-09-15 NOTE — Telephone Encounter (Signed)
Patient contacted [facility] to review lab results seen in My-Chart. Patient advised to continue prescribed antibiotic, and will be contacted by [facility] RN about new results.   [facility] pharmacy verified in chart.

## 2021-09-17 ENCOUNTER — Telehealth (HOSPITAL_COMMUNITY): Payer: Self-pay | Admitting: Emergency Medicine

## 2021-09-17 MED ORDER — CLOTRIMAZOLE 1 % VA CREA: 1.0000 | TOPICAL_CREAM | Freq: Every day | VAGINAL | 0 refills | Status: DC

## 2021-09-17 MED ORDER — METRONIDAZOLE 0.75 % VA GEL: 1.0000 | Freq: Every day | VAGINAL | 0 refills | Status: AC

## 2021-09-27 ENCOUNTER — Encounter: Payer: Self-pay | Admitting: Obstetrics and Gynecology

## 2021-09-27 ENCOUNTER — Ambulatory Visit (INDEPENDENT_AMBULATORY_CARE_PROVIDER_SITE_OTHER): Payer: Medicaid Other | Admitting: Obstetrics and Gynecology

## 2021-09-27 ENCOUNTER — Other Ambulatory Visit (HOSPITAL_COMMUNITY)
Admission: RE | Admit: 2021-09-27 | Discharge: 2021-09-27 | Disposition: A | Discharge status: Home / Self Care | Payer: Medicaid Other | Source: Ambulatory Visit | Attending: Obstetrics and Gynecology | Admitting: Obstetrics and Gynecology

## 2021-09-27 VITALS — BP 147/88 | HR 99 | Ht 67.0 in | Wt 214.0 lb

## 2021-09-27 DIAGNOSIS — B9689 Other specified bacterial agents as the cause of diseases classified elsewhere: Secondary | ICD-10-CM | POA: Insufficient documentation

## 2021-09-27 DIAGNOSIS — A5901 Trichomonal vulvovaginitis: Secondary | ICD-10-CM | POA: Insufficient documentation

## 2021-09-27 DIAGNOSIS — O98311 Other infections with a predominantly sexual mode of transmission complicating pregnancy, first trimester: Secondary | ICD-10-CM | POA: Diagnosis not present

## 2021-09-27 DIAGNOSIS — O23591 Infection of other part of genital tract in pregnancy, first trimester: Secondary | ICD-10-CM | POA: Diagnosis not present

## 2021-09-27 DIAGNOSIS — Z3A13 13 weeks gestation of pregnancy: Secondary | ICD-10-CM | POA: Insufficient documentation

## 2021-09-27 DIAGNOSIS — O09899 Supervision of other high risk pregnancies, unspecified trimester: Secondary | ICD-10-CM

## 2021-09-27 DIAGNOSIS — R030 Elevated blood-pressure reading, without diagnosis of hypertension: Secondary | ICD-10-CM

## 2021-09-27 DIAGNOSIS — N898 Other specified noninflammatory disorders of vagina: Secondary | ICD-10-CM | POA: Diagnosis present

## 2021-09-27 DIAGNOSIS — N760 Acute vaginitis: Secondary | ICD-10-CM

## 2021-09-27 DIAGNOSIS — R350 Frequency of micturition: Secondary | ICD-10-CM | POA: Diagnosis not present

## 2021-09-27 DIAGNOSIS — O0992 Supervision of high risk pregnancy, unspecified, second trimester: Secondary | ICD-10-CM

## 2021-09-27 LAB — POCT URINALYSIS DIPSTICK OB
Bilirubin, UA: NEGATIVE
Glucose, UA: NEGATIVE
Ketones, UA: NEGATIVE
Nitrite, UA: NEGATIVE
Spec Grav, UA: 1.02 (ref 1.010–1.025)
Urobilinogen, UA: 0.2 E.U./dL
pH, UA: 6 (ref 5.0–8.0)

## 2021-09-27 NOTE — Progress Notes (Signed)
ROB. Patient states "lime/bright green" discharge was seen in urgent care was told she had BV UTI and yeast. Patint states she feel like she still has vaginal ittching as well. Patient states no other  questions or concerns at this time.

## 2021-09-27 NOTE — Progress Notes (Signed)
OBSTETRIC INITIAL PRENATAL VISIT  Subjective:    Jasmine Gray is being seen today for her first obstetrical visit.  This is not a planned pregnancy. She is a 31 y.o. [id number] female at [redacted]w[redacted]d gestation, Estimated Date of Delivery: 03/29/22 with last menstrual period was 06/22/2021 (exact date),  consistent with 8 week sono. Her obstetrical history is significant for  preterm labor in last pregnancy, delivery at 35 weeks . Relationship with FOB: significant other, living together. Patient does intend to breast feed. Pregnancy history fully reviewed.  Patient reports complaints of vaginal irritation and itching.  Notes that she was seen in urgent care approximately 1-2 weeks ago was diagnosed with bacterial vaginosis and UTI.  She was given treatments for BV, yeast, and a UTI which she completed approximately 3 days ago, however still notes symptoms are present.  OB History  Gravida Para Term Preterm AB Living  4 2 1 1 1 2   SAB IAB Ectopic Multiple Live Births  1 0 0 0 2    # Outcome Date GA Lbr Len/2nd Weight Sex Delivery Anes PTL Lv  4 Current           3 Preterm 01/10/16 [redacted]w[redacted]d / 00:08 4 lb 13.6 oz (2.2 kg) M Vag-Spont EPI  LIV     Name: Cantu,PENDINGBABY     Apgar1: 8  Apgar5: 9  2 Term 2016   5 lb 1.9 oz (2.322 kg) F Vag-Spont   LIV  1 SAB 2014            Obstetric Comments  States at beginning of pregnancy felt light headed and ill, resolved    Gynecologic History:  Last pap smear was 07/28/2015.  Results were Normal.  Denies h/o abnormal pap smears in the past.  Denies history of STIs.  Contraception prior to conception: Depo Provera, stopped 6 months prior to pregnancy, wanted to give her body a break   Past Medical History:  Diagnosis Date  . Acid reflux    on no RX at present  . Hemorrhoid 11/2015   starting on past Sat. Pt states it is on the outside  . Tobacco abuse     Family History  Problem Relation Age of Onset  . Hypertension Mother   . Migraines Mother   .  Heart disease Father   . Hypertension Father   . Congestive Heart Failure Father   . Stroke Father   . Factor V Leiden deficiency Father   . Cancer Maternal Grandmother 21       breast  . Alcohol abuse Maternal Grandfather   . Hypertension Paternal Grandfather   . Depression Neg Hx     Past Surgical History:  Procedure Laterality Date  . NO PAST SURGERIES      Social History   Socioeconomic History  . Marital status: Significant Other    Spouse name: Not on file  . Number of children: 2  . Years of education: 27  . Highest education level: Not on file  Occupational History  . Occupation: stay at home mom  Tobacco Use  . Smoking status: Every Day    Packs/day: 0.25    Years: 5.00    Total pack years: 1.25    Types: Cigarettes  . Smokeless tobacco: Never  . Tobacco comments:    On nicotine patch  Vaping Use  . Vaping Use: Never used  Substance and Sexual Activity  . Alcohol use: No  . Drug use: No  . Sexual  activity: Yes    Partners: Male    Birth control/protection: None  Other Topics Concern  . Not on file  Social History Narrative  . Not on file   Social Determinants of Health   Financial Resource Strain: Medium Risk (08/23/2021)   Overall Financial Resource Strain (CARDIA)   . Difficulty of Paying Living Expenses: Somewhat hard  Food Insecurity: No Food Insecurity (08/23/2021)   Hunger Vital Sign   . Worried About Programme researcher, broadcasting/film/video in the Last Year: Never true   . Ran Out of Food in the Last Year: Never true  Transportation Needs: No Transportation Needs (08/23/2021)   PRAPARE - Transportation   . Lack of Transportation (Medical): No   . Lack of Transportation (Non-Medical): No  Physical Activity: Insufficiently Active (08/23/2021)   Exercise Vital Sign   . Days of Exercise per Week: 2 days   . Minutes of Exercise per Session: 40 min  Stress: No Stress Concern Present (08/23/2021)   [employer] of Occupational Health - Occupational Stress  Questionnaire   . Feeling of Stress : Not at all  Social Connections: Unknown (08/23/2021)   Social Connection and Isolation Panel [NHANES]   . Frequency of Communication with Friends and Family: More than three times a week   . Frequency of Social Gatherings with Friends and Family: More than three times a week   . Attends Religious Services: Never   . Active Member of Clubs or Organizations: No   . Attends Banker Meetings: Never   . Marital Status: Not on file  Intimate Partner Violence: Not At Risk (08/23/2021)   Humiliation, Afraid, Rape, and Kick questionnaire   . Fear of Current or Ex-Partner: No   . Emotionally Abused: No   . Physically Abused: No   . Sexually Abused: No    Current Outpatient Medications on File Prior to Visit  Medication Sig Dispense Refill  . clotrimazole (GYNE-LOTRIMIN) 1 % vaginal cream Place 1 Applicatorful vaginally at bedtime. 45 g 0  . Prenatal Vit-Fe Fumarate-FA (PRENATAL VITAMINS) 28-0.8 MG TABS Take 1 tablet by mouth daily.    . [DISCONTINUED] albuterol (VENTOLIN HFA) 108 (90 Base) MCG/ACT inhaler Inhale 2 puffs into the lungs every 6 (six) hours as needed for wheezing or shortness of breath. 8 g 0  . [DISCONTINUED] fluticasone (FLONASE) 50 MCG/ACT nasal spray Place 2 sprays into both nostrils daily. 16 g 6  . [DISCONTINUED] metoprolol tartrate (LOPRESSOR) 25 MG tablet Take 0.5 tablets (12.5 mg total) by mouth 2 (two) times daily as needed (Palpitations). (Patient not taking: Reported on 07/08/2019) 20 tablet 0  . [DISCONTINUED] omeprazole (PRILOSEC) 20 MG capsule Take 1 capsule (20 mg total) by mouth daily. 30 capsule 0   No current facility-administered medications on file prior to visit.    Allergies  Allergen Reactions  . Penicillins Hives     Review of Systems General: Not Present- Fever, Weight Loss and Weight Gain. Skin: Not Present- Rash. HEENT: Not Present- Blurred Vision, Headache and Bleeding Gums. Respiratory: Not  Present- Difficulty Breathing. Breast: Not Present- Breast Mass. Cardiovascular: Not Present- Chest Pain, Elevated Blood Pressure, Fainting / Blacking Out and Shortness of Breath. Gastrointestinal: Not Present- Abdominal Pain, Constipation, Nausea and Vomiting. Female Genitourinary: Not Present- Frequency, Painful Urination, Pelvic Pain, Vaginal Bleeding, Vaginal Discharge, Contractions, regular, Fetal Movements Decreased, Urinary Complaints and Vaginal Fluid. Musculoskeletal: Not Present- Back Pain and Leg Cramps. Neurological: Not Present- Dizziness. Psychiatric: Not Present- Depression.     Objective:  Blood pressure (!) 153/92, repeat 147/88, pulse 99, weight 214 lb (97.1 kg), last menstrual period 06/22/2021.  Body mass index is 33.52 kg/m.  General Appearance:    Alert, cooperative, no distress, appears stated age, mild obesity  Head:    Normocephalic, without obvious abnormality, atraumatic  Eyes:    PERRL, conjunctiva/corneas clear, EOM's intact, both eyes  Ears:    Normal external ear canals, both ears  Nose:   Nares normal, septum midline, mucosa normal, no drainage or sinus tenderness  Throat:   Lips, mucosa, and tongue normal; teeth and gums normal  Neck:   Supple, symmetrical, trachea midline, no adenopathy; thyroid: no enlargement/tenderness/nodules; no carotid bruit or JVD  Back:     Symmetric, no curvature, ROM normal, no CVA tenderness  Lungs:     Clear to auscultation bilaterally, respirations unlabored  Chest Wall:    No tenderness or deformity   Heart:    Regular rate and rhythm, S1 and S2 normal, no murmur, rub or gallop  Breast Exam:    No tenderness, masses, or nipple abnormality  Abdomen:     Soft, non-tender, bowel sounds active all four quadrants, no masses, no organomegaly.  FHT 155 bpm.  Genitalia:    Pelvic:external genitalia normal, vagina without lesions, or tenderness, rectovaginal septum normal.  Moderate thin white vaginal discharge present in vaginal  vault.  Cervix normal in appearance, no cervical motion tenderness, no adnexal masses or tenderness.  Pregnancy positive findings: uterine enlargement: 14 wk size, nontender.   Rectal:    Normal external sphincter.  No hemorrhoids appreciated. Internal exam not done.   Extremities:   Extremities normal, atraumatic, no cyanosis or edema  Pulses:   2+ and symmetric all extremities  Skin:   Skin color, texture, turgor normal, no rashes or lesions  Lymph nodes:   Cervical, supraclavicular, and axillary nodes normal  Neurologic:   CNII-XII intact, normal strength, sensation and reflexes throughout     Assessment:   1. Supervision of high risk pregnancy in second trimester   2. [redacted] weeks gestation of pregnancy   3. Frequent urination   4. Acute vaginitis   5. History of preterm delivery, currently pregnant   6. Elevated BP without diagnosis of hypertension     Plan:   1. Supervision of high risk high risk pregnancy  - Initial labs reviewed. - Prenatal vitamins encouraged. - Problem list reviewed and updated. - New OB counseling:  The patient has been given an overview regarding routine prenatal care.  Recommendations regarding diet, weight gain, and exercise in pregnancy were given. - Prenatal testing, optional genetic testing, and ultrasound use in pregnancy were reviewed.  Traditional genetic screening vs cell-fee DNA genetic screening discussed, including risks and benefits. Testing results reviewed, normal [id number].  For AFP next visit. - Benefits of Breast Feeding were discussed. The patient is encouraged to consider nursing her baby post partum. -The patient has Medicaid.  [facility] Medicaid Risk Screening Form completed today    2. Frequent urination -UA performed today normal, urine culture repeated.  3. Acute vaginitis -Patient is status post recent treatment for BV, yeast, and UTI.  Has completed antibiotics.  Still noting symptoms.  Will read culture again today.  Can utilize OTC  Vagisil as needed.   4. History of preterm delivery, currently pregnant -Patient with history of preterm delivery at 35 weeks.  No known underlying cause as first pregnancy went postdates.  We will continue to monitor during this pregnancy.    5.  Elevated blood pressure without diagnosis of hypertension  - Elevated blood pressures noted on today's visit.  Patient with no previous history of hypertension.  Reports that she was nervous today.  We will follow-up next visit.  If BPs remain elevated likely has diagnosis of chronic hypertension, will need to begin baby aspirin.   Follow up in 4 weeks.    Hildred Laser, MD [facility]

## 2021-09-28 LAB — CERVICOVAGINAL ANCILLARY ONLY
Bacterial Vaginitis (gardnerella): POSITIVE — AB
Candida Glabrata: NEGATIVE
Candida Vaginitis: NEGATIVE
Chlamydia: NEGATIVE
Comment: NEGATIVE
Comment: NEGATIVE
Comment: NEGATIVE
Comment: NEGATIVE
Comment: NEGATIVE
Comment: NORMAL
Neisseria Gonorrhea: NEGATIVE
Trichomonas: POSITIVE — AB

## 2021-09-29 LAB — URINE CULTURE

## 2021-09-30 ENCOUNTER — Encounter: Payer: Self-pay | Admitting: Student

## 2021-09-30 DIAGNOSIS — O468X9 Other antepartum hemorrhage, unspecified trimester: Secondary | ICD-10-CM | POA: Insufficient documentation

## 2021-09-30 DIAGNOSIS — O418X90 Other specified disorders of amniotic fluid and membranes, unspecified trimester, not applicable or unspecified: Secondary | ICD-10-CM | POA: Insufficient documentation

## 2021-10-01 ENCOUNTER — Encounter: Payer: Self-pay | Admitting: Obstetrics and Gynecology

## 2021-10-01 ENCOUNTER — Other Ambulatory Visit: Payer: Self-pay | Admitting: Obstetrics and Gynecology

## 2021-10-01 DIAGNOSIS — A599 Trichomoniasis, unspecified: Secondary | ICD-10-CM | POA: Insufficient documentation

## 2021-10-01 MED ORDER — METRONIDAZOLE 500 MG PO TABS: 500.0000 mg | ORAL_TABLET | Freq: Two times a day (BID) | ORAL | 1 refills | Status: DC

## 2021-10-03 LAB — CYTOLOGY - PAP: Diagnosis: NEGATIVE

## 2021-10-25 ENCOUNTER — Encounter: Payer: Medicaid Other | Admitting: Obstetrics and Gynecology

## 2021-10-25 DIAGNOSIS — Z113 Encounter for screening for infections with a predominantly sexual mode of transmission: Secondary | ICD-10-CM

## 2021-10-25 DIAGNOSIS — O0992 Supervision of high risk pregnancy, unspecified, second trimester: Secondary | ICD-10-CM

## 2021-10-25 DIAGNOSIS — Z3A17 17 weeks gestation of pregnancy: Secondary | ICD-10-CM

## 2021-10-25 DIAGNOSIS — Z1379 Encounter for other screening for genetic and chromosomal anomalies: Secondary | ICD-10-CM

## 2021-10-30 ENCOUNTER — Other Ambulatory Visit (HOSPITAL_COMMUNITY)
Admission: RE | Admit: 2021-10-30 | Discharge: 2021-10-30 | Disposition: A | Discharge status: Home / Self Care | Payer: Medicaid Other | Source: Ambulatory Visit | Attending: Obstetrics and Gynecology | Admitting: Obstetrics and Gynecology

## 2021-10-30 ENCOUNTER — Encounter: Payer: Self-pay | Admitting: Obstetrics and Gynecology

## 2021-10-30 ENCOUNTER — Ambulatory Visit (INDEPENDENT_AMBULATORY_CARE_PROVIDER_SITE_OTHER): Payer: Medicaid Other | Admitting: Obstetrics and Gynecology

## 2021-10-30 VITALS — Wt 213.8 lb

## 2021-10-30 DIAGNOSIS — Z113 Encounter for screening for infections with a predominantly sexual mode of transmission: Secondary | ICD-10-CM

## 2021-10-30 DIAGNOSIS — Z3480 Encounter for supervision of other normal pregnancy, unspecified trimester: Secondary | ICD-10-CM

## 2021-10-30 DIAGNOSIS — Z3A18 18 weeks gestation of pregnancy: Secondary | ICD-10-CM

## 2021-10-30 DIAGNOSIS — Z1379 Encounter for other screening for genetic and chromosomal anomalies: Secondary | ICD-10-CM

## 2021-10-30 DIAGNOSIS — O0992 Supervision of high risk pregnancy, unspecified, second trimester: Secondary | ICD-10-CM

## 2021-10-30 LAB — POCT URINALYSIS DIPSTICK OB
Bilirubin, UA: NEGATIVE
Blood, UA: NEGATIVE
Glucose, UA: NEGATIVE
Leukocytes, UA: NEGATIVE
Nitrite, UA: NEGATIVE
POC,PROTEIN,UA: NEGATIVE
Spec Grav, UA: 1.02 (ref 1.010–1.025)
Urobilinogen, UA: 0.2 E.U./dL
pH, UA: 6.5 (ref 5.0–8.0)

## 2021-10-30 NOTE — Progress Notes (Signed)
ROB. Patient states starting to feel fetal movement, denies pain or pressure. TOC for trich due today. AFP ordered. Patient states no questions or concerns at this time.

## 2021-10-30 NOTE — Progress Notes (Signed)
ROB: Doing well-has no complaints.  Desires AFP, drawn today.  Repeat test for trichomoniasis done today.  Ultrasound for fetal anatomy next visit.  Blood pressures normal today.

## 2021-11-01 LAB — CERVICOVAGINAL ANCILLARY ONLY
Comment: NEGATIVE
Trichomonas: POSITIVE — AB

## 2021-11-02 LAB — AFP, SERUM, OPEN SPINA BIFIDA
AFP MoM: 1
AFP Value: 38.3 ng/mL
Gest. Age on Collection Date: 18.6 weeks
Maternal Age At EDD: 30.2 yr
OSBR Risk 1 IN: 10000
Test Results:: NEGATIVE
Weight: 214 [lb_av]

## 2021-11-08 ENCOUNTER — Other Ambulatory Visit: Payer: Self-pay

## 2021-11-08 DIAGNOSIS — A599 Trichomoniasis, unspecified: Secondary | ICD-10-CM

## 2021-11-08 MED ORDER — METRONIDAZOLE 500 MG PO TABS: ORAL_TABLET | ORAL | 1 refills | Status: DC

## 2021-11-21 ENCOUNTER — Encounter: Payer: Self-pay | Admitting: Obstetrics and Gynecology

## 2021-11-21 ENCOUNTER — Other Ambulatory Visit: Payer: Self-pay

## 2021-11-21 ENCOUNTER — Ambulatory Visit (INDEPENDENT_AMBULATORY_CARE_PROVIDER_SITE_OTHER): Payer: Medicaid Other | Admitting: Obstetrics and Gynecology

## 2021-11-21 ENCOUNTER — Other Ambulatory Visit (HOSPITAL_COMMUNITY)
Admission: RE | Admit: 2021-11-21 | Discharge: 2021-11-21 | Disposition: A | Discharge status: Home / Self Care | Payer: Medicaid Other | Source: Ambulatory Visit | Attending: Obstetrics and Gynecology | Admitting: Obstetrics and Gynecology

## 2021-11-21 ENCOUNTER — Ambulatory Visit (INDEPENDENT_AMBULATORY_CARE_PROVIDER_SITE_OTHER): Payer: Medicaid Other

## 2021-11-21 VITALS — BP 136/73 | HR 85 | Wt 212.2 lb

## 2021-11-21 DIAGNOSIS — O0990 Supervision of high risk pregnancy, unspecified, unspecified trimester: Secondary | ICD-10-CM

## 2021-11-21 DIAGNOSIS — Z716 Tobacco abuse counseling: Secondary | ICD-10-CM

## 2021-11-21 DIAGNOSIS — O0992 Supervision of high risk pregnancy, unspecified, second trimester: Secondary | ICD-10-CM

## 2021-11-21 DIAGNOSIS — F17210 Nicotine dependence, cigarettes, uncomplicated: Secondary | ICD-10-CM

## 2021-11-21 DIAGNOSIS — A599 Trichomoniasis, unspecified: Secondary | ICD-10-CM

## 2021-11-21 DIAGNOSIS — Z3A21 21 weeks gestation of pregnancy: Secondary | ICD-10-CM

## 2021-11-21 DIAGNOSIS — Z3689 Encounter for other specified antenatal screening: Secondary | ICD-10-CM

## 2021-11-21 DIAGNOSIS — O99332 Smoking (tobacco) complicating pregnancy, second trimester: Secondary | ICD-10-CM

## 2021-11-21 DIAGNOSIS — Z3A18 18 weeks gestation of pregnancy: Secondary | ICD-10-CM

## 2021-11-21 DIAGNOSIS — O09899 Supervision of other high risk pregnancies, unspecified trimester: Secondary | ICD-10-CM

## 2021-11-21 DIAGNOSIS — O98312 Other infections with a predominantly sexual mode of transmission complicating pregnancy, second trimester: Secondary | ICD-10-CM

## 2021-11-21 LAB — POCT URINALYSIS DIPSTICK OB
Bilirubin, UA: NEGATIVE
Blood, UA: NEGATIVE
Glucose, UA: NEGATIVE
Ketones, UA: NEGATIVE
Leukocytes, UA: NEGATIVE
Nitrite, UA: NEGATIVE
POC,PROTEIN,UA: NEGATIVE
Spec Grav, UA: 1.01 (ref 1.010–1.025)
Urobilinogen, UA: 0.2 E.U./dL
pH, UA: 6.5 (ref 5.0–8.0)

## 2021-11-21 MED ORDER — NICOTINE 7 MG/24HR TD PT24: 7.0000 mg | MEDICATED_PATCH | Freq: Every day | TRANSDERMAL | 3 refills | Status: DC

## 2021-11-21 MED ORDER — NICOTINE 14 MG/24HR TD PT24: 14.0000 mg | MEDICATED_PATCH | Freq: Every day | TRANSDERMAL | 0 refills | Status: DC

## 2021-11-21 NOTE — Progress Notes (Signed)
ROB: Patient overall provide major complaints.  Had anatomy scan today, appears normal.  Has questions regarding tobacco use and breast-feeding.  Discussed risks of tobacco use during pregnancy (growth restriction, prematurity) and effects on breast-feeding (decreased milk supply, colic, and milk let-down issues).  Encouraged smoking cessation.patient notes that she would like to try to wean down, would like to try the patch.  Prescribed NicoDerm CQ.  Patient notes that she is currently smoking approximately 1/4 to 1/2 pack/day.  We will start with 14 mg dosing and titrate down to 7 mg dosing.  Test of cure performed again for recent trichomonas infection (was still positive after last TOC performed last month, retreated). Discussed circumcision for female infant, is desired. Patient and partner have questions regarding their other child as he was not circumcised and desire to look into procedure for him as well.  Discussed contraception, patient notes that she is debating between tubal ligation and LARC.  Given handouts on both.  To discuss further at future visits.  RTC in 4 weeks.

## 2021-11-21 NOTE — Progress Notes (Signed)
ROB [redacted]w[redacted]d: She is doing well, she has good fetal movement and no new concerns.

## 2021-11-22 LAB — CERVICOVAGINAL ANCILLARY ONLY
Comment: NEGATIVE
Trichomonas: NEGATIVE

## 2021-12-19 ENCOUNTER — Encounter: Payer: Self-pay | Admitting: Licensed Practical Nurse

## 2021-12-19 ENCOUNTER — Ambulatory Visit (INDEPENDENT_AMBULATORY_CARE_PROVIDER_SITE_OTHER): Payer: Medicaid Other | Admitting: Licensed Practical Nurse

## 2021-12-19 VITALS — BP 144/75 | HR 95 | Wt 212.0 lb

## 2021-12-19 DIAGNOSIS — O09292 Supervision of pregnancy with other poor reproductive or obstetric history, second trimester: Secondary | ICD-10-CM

## 2021-12-19 DIAGNOSIS — O09212 Supervision of pregnancy with history of pre-term labor, second trimester: Secondary | ICD-10-CM

## 2021-12-19 DIAGNOSIS — E663 Overweight: Secondary | ICD-10-CM

## 2021-12-19 DIAGNOSIS — F17210 Nicotine dependence, cigarettes, uncomplicated: Secondary | ICD-10-CM

## 2021-12-19 DIAGNOSIS — Z131 Encounter for screening for diabetes mellitus: Secondary | ICD-10-CM

## 2021-12-19 DIAGNOSIS — O162 Unspecified maternal hypertension, second trimester: Secondary | ICD-10-CM

## 2021-12-19 DIAGNOSIS — O99212 Obesity complicating pregnancy, second trimester: Secondary | ICD-10-CM

## 2021-12-19 DIAGNOSIS — O99332 Smoking (tobacco) complicating pregnancy, second trimester: Secondary | ICD-10-CM

## 2021-12-19 DIAGNOSIS — Z3A25 25 weeks gestation of pregnancy: Secondary | ICD-10-CM

## 2021-12-19 DIAGNOSIS — Z3480 Encounter for supervision of other normal pregnancy, unspecified trimester: Secondary | ICD-10-CM

## 2021-12-19 LAB — POCT URINALYSIS DIPSTICK
Bilirubin, UA: NEGATIVE
Blood, UA: NEGATIVE
Glucose, UA: NEGATIVE
Ketones, UA: NEGATIVE
Leukocytes, UA: NEGATIVE
Nitrite, UA: NEGATIVE
Protein, UA: NEGATIVE
Spec Grav, UA: 1.01 (ref 1.010–1.025)
Urobilinogen, UA: 0.2 E.U./dL
pH, UA: 6 (ref 5.0–8.0)

## 2021-12-19 NOTE — Progress Notes (Signed)
Routine Prenatal Care Visit  Subjective  Jasmine Gray is a 31 y.o. [id number] at [redacted]w[redacted]d being seen today for ongoing prenatal care.  She is currently monitored for the following issues for this low-risk pregnancy and has Tobacco user; Overweight (BMI 25.0-29.9); History of premature delivery; UTI (urinary tract infection); Supervision of high risk pregnancy, antepartum; Subchorionic hemorrhage; and Trichomonal infection on their problem list.  ----------------------------------------------------------------------------------- Patient reports no complaints.   BP elevated today, has never been told that she has CHTN, denies HA, visual disturbances or RUQ pain. Will do PIH labs and return in 1 week. Aware she may need medication.  -reviewed GDM screening in 3 weeks -Quit smoking and no longer uses patches, feels good.  -Is considering BTL Contractions: Not present. Vag. Bleeding: None.  Movement: Present. Leaking Fluid denies.  ----------------------------------------------------------------------------------- The following portions of the patient's history were reviewed and updated as appropriate: allergies, current medications, past family history, past medical history, past social history, past surgical history and problem list. Problem list updated.  Objective  Blood pressure (!) 144/75, pulse 95, weight 212 lb (96.2 kg), last menstrual period 06/22/2021. Pregravid weight 220 lb (99.8 kg) Total Weight Gain -8 lb (-3.629 kg) Urinalysis: Urine Protein    Urine Glucose    Fetal Status:     Movement: Present     General:  Alert, oriented and cooperative. Patient is in no acute distress.  Skin: Skin is warm and dry. No rash noted.   Cardiovascular: Normal heart rate noted  Respiratory: Normal respiratory effort, no problems with respiration noted  Abdomen: Soft, gravid, appropriate for gestational age. Pain/Pressure: Absent     Pelvic:  Cervical exam deferred        Extremities: Normal range of  motion.     Mental Status: Normal mood and affect. Normal behavior. Normal judgment and thought content.   Assessment   31 y.o. [id number] at [redacted]w[redacted]d by  03/29/2022, by Last Menstrual Period presenting for routine prenatal visit  Elevated blood Pressure  Plan   G4 Problems (from 08/13/21 to present)     Problem Noted Resolved   Supervision of high risk pregnancy, antepartum 08/23/2021 by Loran Senters, CMA No   Overview Addendum 11/21/2021  7:33 PM by Hildred Laser, MD     Clinical Staff Provider  Office Location  [facility] Dating  03/29/2022, by Last Menstrual Period  Language  English Anatomy US  Normal, 11/21/2021  Flu Vaccine  Declines Genetic Screen  NIPS: MaterniT21 normal   TDaP vaccine    Hgb A1C or  GTT Early : Third trimester :   Covid    LAB RESULTS   Rhogam  A/Positive/-- (07/31 1002)  Blood Type A/Positive/-- (07/31 1002)   Feeding Plan Breast Antibody Negative (07/31 1002)  Contraception Undecided (possible BTL) Rubella 2.88 (07/31 1002)  Circumcision Desired RPR Non Reactive (07/31 1002)   Pediatrician  [facility] HBsAg Negative (07/31 1002)   Support Person Devarius HIV Non Reactive (07/31 1002)  Prenatal Classes Declines Varicella Immune (07/31 1002)    GBS  (For PCN allergy, check sensitivities)   BTL Consent Considering Hep C Non Reactive (07/31 1002)   VBAC Consent  Pap Diagnosis  Date Value Ref Range Status  09/27/2021   Final   - Negative for intraepithelial lesion or malignancy (NILM)      Hgb Electro      CF      SMA  Preterm labor symptoms and general obstetric precautions including but not limited to vaginal bleeding, contractions, leaking of fluid and fetal movement were reviewed in detail with the patient. Please refer to After Visit Summary for other counseling recommendations.   Return in about 1 week (around 12/26/2021) for BP check . Declines flu vaccine  Carie Caddy, CNM  Domingo Pulse, [facility]   12/19/21  10:54 AM

## 2021-12-20 LAB — COMPREHENSIVE METABOLIC PANEL
ALT: 6 IU/L (ref 0–32)
AST: 9 IU/L (ref 0–40)
Albumin/Globulin Ratio: 1.5 (ref 1.2–2.2)
Albumin: 3.9 g/dL — ABNORMAL LOW (ref 4.0–5.0)
Alkaline Phosphatase: 84 IU/L (ref 44–121)
BUN/Creatinine Ratio: 6 — ABNORMAL LOW (ref 9–23)
BUN: 3 mg/dL — ABNORMAL LOW (ref 6–20)
Bilirubin Total: <0.2 mg/dL (ref 0.0–1.2)
CO2: 20 mmol/L (ref 20–29)
Calcium: 9.1 mg/dL (ref 8.7–10.2)
Chloride: 101 mmol/L (ref 96–106)
Creatinine, Ser: 0.47 mg/dL — ABNORMAL LOW (ref 0.57–1.00)
Globulin, Total: 2.6 g/dL (ref 1.5–4.5)
Glucose: 68 mg/dL — ABNORMAL LOW (ref 70–99)
Potassium: 3.9 mmol/L (ref 3.5–5.2)
Sodium: 137 mmol/L (ref 134–144)
Total Protein: 6.5 g/dL (ref 6.0–8.5)
eGFR: 132 mL/min/{1.73_m2} (ref >59)

## 2021-12-20 LAB — CBC WITH DIFFERENTIAL/PLATELET
Basophils Absolute: 0 10*3/uL (ref 0.0–0.2)
Basos: 0 %
EOS (ABSOLUTE): 0 10*3/uL (ref 0.0–0.4)
Eos: 0 %
Hematocrit: 38.7 % (ref 34.0–46.6)
Hemoglobin: 13.1 g/dL (ref 11.1–15.9)
Immature Grans (Abs): 0.1 10*3/uL (ref 0.0–0.1)
Immature Granulocytes: 1 %
Lymphocytes Absolute: 1.6 10*3/uL (ref 0.7–3.1)
Lymphs: 21 %
MCH: 31.5 pg (ref 26.6–33.0)
MCHC: 33.9 g/dL (ref 31.5–35.7)
MCV: 93 fL (ref 79–97)
Monocytes Absolute: 0.5 10*3/uL (ref 0.1–0.9)
Monocytes: 7 %
Neutrophils Absolute: 5.4 10*3/uL (ref 1.4–7.0)
Neutrophils: 71 %
Platelets: 237 10*3/uL (ref 150–450)
RBC: 4.16 x10E6/uL (ref 3.77–5.28)
RDW: 14.5 % (ref 11.7–15.4)
WBC: 7.7 10*3/uL (ref 3.4–10.8)

## 2021-12-20 LAB — PROTEIN / CREATININE RATIO, URINE
Creatinine, Urine: 86.3 mg/dL
Protein, Ur: 16.1 mg/dL
Protein/Creat Ratio: 187 mg/g creat (ref 0–200)

## 2022-01-01 NOTE — Progress Notes (Unsigned)
    NURSE VISIT NOTE  Subjective:    Patient ID: Jasmine Gray, female    DOB: 1990/10/02, 31 y.o.   MRN: [id number]  HPI  Patient is a 31 y.o. [id number] female who presents for BP check per order from Carie Caddy, [place]. She reports her bp's have been normal at home. She has a manual cuff and requests prescription for a digital monitor.  Patient reports compliance with prescribed BP medications: N/A  Last dose of BP medication: N/A  BP Readings from Last 3 Encounters:  01/02/22 120/68  12/19/21 (!) 144/75  11/21/21 136/73   Pulse Readings from Last 3 Encounters:  01/02/22 97  12/19/21 95  11/21/21 85    Objective:    BP 120/68   Pulse 97   Ht 5\' 7"  (1.702 m)   Wt 216 lb 14.4 oz (98.4 kg)   LMP 06/22/2021 (Exact Date)   BMI 33.97 kg/m    Assessment:   1. Elevated blood pressure affecting pregnancy in second trimester, antepartum      Plan:   Per Dr. 06/24/2021, CNM: Patient to continue monitoring bp at home. She will contact [place] with any readings >140/90 or with any associated symptoms.   Patient verbalized understanding of instructions.   [place], LPN

## 2022-01-02 ENCOUNTER — Ambulatory Visit (INDEPENDENT_AMBULATORY_CARE_PROVIDER_SITE_OTHER): Payer: Medicaid Other

## 2022-01-02 VITALS — BP 120/68 | HR 97 | Ht 67.0 in | Wt 216.9 lb

## 2022-01-02 DIAGNOSIS — Z3A27 27 weeks gestation of pregnancy: Secondary | ICD-10-CM

## 2022-01-02 DIAGNOSIS — O162 Unspecified maternal hypertension, second trimester: Secondary | ICD-10-CM

## 2022-01-02 MED ORDER — BD ASSURE BPM/AUTO ARM CUFF MISC: 1.0000 [IU] | Freq: Every day | 0 refills | Status: AC | PRN

## 2022-01-10 ENCOUNTER — Telehealth: Payer: Self-pay

## 2022-01-16 ENCOUNTER — Other Ambulatory Visit: Payer: Medicaid Other

## 2022-01-16 ENCOUNTER — Encounter: Payer: Self-pay | Admitting: Obstetrics and Gynecology

## 2022-01-16 ENCOUNTER — Ambulatory Visit (INDEPENDENT_AMBULATORY_CARE_PROVIDER_SITE_OTHER): Payer: Medicaid Other | Admitting: Obstetrics and Gynecology

## 2022-01-16 VITALS — BP 128/77 | HR 79 | Wt 221.0 lb

## 2022-01-16 DIAGNOSIS — Z3480 Encounter for supervision of other normal pregnancy, unspecified trimester: Secondary | ICD-10-CM

## 2022-01-16 DIAGNOSIS — Z131 Encounter for screening for diabetes mellitus: Secondary | ICD-10-CM

## 2022-01-16 DIAGNOSIS — O139 Gestational [pregnancy-induced] hypertension without significant proteinuria, unspecified trimester: Secondary | ICD-10-CM

## 2022-01-16 DIAGNOSIS — Z3A29 29 weeks gestation of pregnancy: Secondary | ICD-10-CM

## 2022-01-16 DIAGNOSIS — O09899 Supervision of other high risk pregnancies, unspecified trimester: Secondary | ICD-10-CM

## 2022-01-16 DIAGNOSIS — O0990 Supervision of high risk pregnancy, unspecified, unspecified trimester: Secondary | ICD-10-CM

## 2022-01-16 DIAGNOSIS — O0992 Supervision of high risk pregnancy, unspecified, second trimester: Secondary | ICD-10-CM

## 2022-01-16 LAB — POCT URINALYSIS DIPSTICK OB
Bilirubin, UA: NEGATIVE
Blood, UA: NEGATIVE
Glucose, UA: NEGATIVE
Ketones, UA: NEGATIVE
Leukocytes, UA: NEGATIVE
Nitrite, UA: NEGATIVE
POC,PROTEIN,UA: NEGATIVE
Spec Grav, UA: 1.01 (ref 1.010–1.025)
Urobilinogen, UA: 0.2 E.U./dL
pH, UA: 6.5 (ref 5.0–8.0)

## 2022-01-16 NOTE — Progress Notes (Signed)
ROB.Patient states no questions or concerns at this time.  

## 2022-01-16 NOTE — Progress Notes (Addendum)
ROB: Jasmine Gray is a 31 y.o. [phone] female at [redacted]w[redacted]d. Patient doing well, no issues.  For 28 week labs today.  Plans to  plans to breastfeed, desires Depo Provera for contraception. For Tdap next visit, signed blood consent. Discussed pain management in labor, considering natural labor. Reveiwed BPs, has now had 2 elevated BPs during pregnancy, meets criteria for diagnosis of HTN in pregnancy. BP today however normal. Has concerns for fetal growth as last visit was noted to be small. Today's growth normal.  For growth scan in 2-3 weeks for newly dx GHTN, also to begin antenatal testing at 32 weeks. RTC in 2-3 weeks.

## 2022-01-17 ENCOUNTER — Encounter: Payer: Self-pay | Admitting: Obstetrics and Gynecology

## 2022-01-17 LAB — 28 WEEK RH+PANEL
Basophils Absolute: 0 10*3/uL (ref 0.0–0.2)
Basos: 0 %
EOS (ABSOLUTE): 0.1 10*3/uL (ref 0.0–0.4)
Eos: 1 %
Gestational Diabetes Screen: 114 mg/dL (ref 70–139)
HIV Screen 4th Generation wRfx: NONREACTIVE
Hematocrit: 38.7 % (ref 34.0–46.6)
Hemoglobin: 13.3 g/dL (ref 11.1–15.9)
Immature Grans (Abs): 0.1 10*3/uL (ref 0.0–0.1)
Immature Granulocytes: 1 %
Lymphocytes Absolute: 2.2 10*3/uL (ref 0.7–3.1)
Lymphs: 20 %
MCH: 32.8 pg (ref 26.6–33.0)
MCHC: 34.4 g/dL (ref 31.5–35.7)
MCV: 95 fL (ref 79–97)
Monocytes Absolute: 0.3 10*3/uL (ref 0.1–0.9)
Monocytes: 3 %
Neutrophils Absolute: 8.1 10*3/uL — ABNORMAL HIGH (ref 1.4–7.0)
Neutrophils: 75 %
Platelets: 243 10*3/uL (ref 150–450)
RBC: 4.06 x10E6/uL (ref 3.77–5.28)
RDW: 14.2 % (ref 11.7–15.4)
RPR Ser Ql: NONREACTIVE
WBC: 10.6 10*3/uL (ref 3.4–10.8)

## 2022-01-23 ENCOUNTER — Ambulatory Visit: Payer: Medicaid Other

## 2022-01-25 ENCOUNTER — Ambulatory Visit: Payer: Medicaid Other

## 2022-02-06 ENCOUNTER — Encounter: Payer: Self-pay | Admitting: Licensed Practical Nurse

## 2022-02-06 ENCOUNTER — Ambulatory Visit (INDEPENDENT_AMBULATORY_CARE_PROVIDER_SITE_OTHER): Payer: Medicaid Other | Admitting: Licensed Practical Nurse

## 2022-02-06 ENCOUNTER — Ambulatory Visit: Payer: Medicaid Other

## 2022-02-06 VITALS — BP 165/93 | HR 106 | Ht 67.0 in | Wt 225.9 lb

## 2022-02-06 VITALS — BP 134/93 | HR 99 | Wt 225.9 lb

## 2022-02-06 DIAGNOSIS — O0992 Supervision of high risk pregnancy, unspecified, second trimester: Secondary | ICD-10-CM

## 2022-02-06 DIAGNOSIS — O0990 Supervision of high risk pregnancy, unspecified, unspecified trimester: Secondary | ICD-10-CM

## 2022-02-06 DIAGNOSIS — Z23 Encounter for immunization: Secondary | ICD-10-CM | POA: Diagnosis not present

## 2022-02-06 DIAGNOSIS — O133 Gestational [pregnancy-induced] hypertension without significant proteinuria, third trimester: Secondary | ICD-10-CM

## 2022-02-06 DIAGNOSIS — Z3A32 32 weeks gestation of pregnancy: Secondary | ICD-10-CM

## 2022-02-06 LAB — POCT URINALYSIS DIPSTICK
Bilirubin, UA: NEGATIVE
Blood, UA: NEGATIVE
Glucose, UA: NEGATIVE
Ketones, UA: NEGATIVE
Leukocytes, UA: NEGATIVE
Nitrite, UA: NEGATIVE
Protein, UA: NEGATIVE
Spec Grav, UA: 1.01 (ref 1.010–1.025)
Urobilinogen, UA: 1 E.U./dL
pH, UA: 6 (ref 5.0–8.0)

## 2022-02-06 MED ORDER — LABETALOL HCL 100 MG PO TABS: 100.0000 mg | ORAL_TABLET | Freq: Two times a day (BID) | ORAL | 1 refills | Status: DC

## 2022-02-06 MED ORDER — BLOOD PRESSURE KIT DEVI: 1.0000 | Freq: Two times a day (BID) | 0 refills | Status: DC

## 2022-02-06 NOTE — Progress Notes (Signed)
Routine Prenatal Care Visit  Subjective  Jasmine Gray is a 32 y.o. [phone] at [redacted]w[redacted]d being seen today for ongoing prenatal care.  She is currently monitored for the following issues for this high-risk pregnancy and has Tobacco user; Overweight (BMI 25.0-29.9); History of premature delivery; UTI (urinary tract infection); Supervision of high risk pregnancy, antepartum; Subchorionic hemorrhage; Trichomonal infection; Gestational hypertension without significant proteinuria, antepartum; Supervision of high risk pregnancy in second trimester; Gestational hypertension, third trimester; and [redacted] weeks gestation of pregnancy on their problem list.  ----------------------------------------------------------------------------------- Patient reports  had a sinus Headache earlier this morning, took Tylenol Sinus and it improved  Denies visual disturbances or RUQ pain. Reviewed GHTN, preeclampsia severe preeclampsia   -reviewed Blood pressures with Dr Amalia Hailey, will do labs and start Labetalol 100mg  BID  -will do weekly NST, plan birth between 37-39 weeks, has growth [place] scheduled for tomorrow.   Contractions: Not present. Vag. Bleeding: None.  Movement: Present. Leaking Fluid denies.  ----------------------------------------------------------------------------------- The following portions of the patient's history were reviewed and updated as appropriate: allergies, current medications, past family history, past medical history, past social history, past surgical history and problem list. Problem list updated.  Objective  Blood pressure (!) 165/93, pulse (!) 106, weight 225 lb 14.4 oz (102.5 kg), last menstrual period 06/22/2021. Pregravid weight 220 lb (99.8 kg) Total Weight Gain 5 lb 14.4 oz (2.676 kg) Urinalysis: Urine Protein    Urine Glucose    Fetal Status: Fetal Heart Rate (bpm): 155 Fundal Height: 32 cm Movement: Present     NST: initially Non reactive as only 1 15 x15 acceleration was present, pt only had  a banana all morning, pt left the office and ate lunch and returned for a repeat NST. NST reactive  Baseline 140, moderate variability, poss accel (15x15 x2) neg decel Fetus audibly  active during NST   General:  Alert, oriented and cooperative. Patient is in no acute distress.  Skin: Skin is warm and dry. No rash noted.   Cardiovascular: Normal heart rate noted  Respiratory: Normal respiratory effort, no problems with respiration noted  Abdomen: Soft, gravid, appropriate for gestational age. Pain/Pressure: Absent     Pelvic:  Cervical exam deferred        Extremities: Normal range of motion.  Edema: Trace  Mental Status: Normal mood and affect. Normal behavior. Normal judgment and thought content.   Assessment   32 y.o. [id number] at [redacted]w[redacted]d by  03/29/2022, by Last Menstrual Period presenting for routine prenatal visit  Plan   G4 Problems (from 08/13/21 to present)     Problem Noted Resolved   Supervision of high risk pregnancy, antepartum 08/23/2021 by Cleophas Dunker, CMA No   Overview Addendum 02/06/2022 10:45 AM by Inis Sizer, [place] Staff Provider  Office Location  [facility] Dating  03/29/2022, by Last Menstrual Period  Language  English Anatomy US  Normal, 11/21/2021  Flu Vaccine  Declines Genetic Screen  NIPS: MaterniT21 normal   TDaP vaccine   02/06/2022 Hgb A1C or  GTT Early : Third trimester :   Covid    LAB RESULTS   Rhogam  A/Positive/-- (07/31 1002)  Blood Type A/Positive/-- (07/31 1002)   Feeding Plan Breast Antibody Negative (07/31 1002)  Contraception Depo Provera Rubella 2.88 (07/31 1002)  Circumcision Desired RPR Non Reactive (07/31 1002)   Pediatrician  [employer] HBsAg Negative (07/31 1002)   Support Person Devarius HIV Non Reactive (07/31 1002)  Prenatal Classes Declines Varicella  Immune (07/31 1002)    GBS  (For PCN allergy, check sensitivities)   BTL Consent Considering Hep C Non Reactive (07/31 1002)   VBAC Consent  Pap Diagnosis  Date  Value Ref Range Status  09/27/2021   Final   - Negative for intraepithelial lesion or malignancy (NILM)      Hgb Electro      CF      SMA                   Preterm labor symptoms and general obstetric precautions including but not limited to vaginal bleeding, contractions, leaking of fluid and fetal movement were reviewed in detail with the patient. Please refer to After Visit Summary for other counseling recommendations.   Return in about 1 week (around 02/13/2022) for ROB, NST .  Growth [place] tomorrow  Labetalol 100mg  BID sent to [place]  02/06/22  5:22 PM

## 2022-02-06 NOTE — Patient Instructions (Signed)

## 2022-02-06 NOTE — Progress Notes (Signed)
    NURSE VISIT NOTE  Subjective:    Patient ID: Jasmine Gray, female    DOB: [birth year], 32 y.o.   MRN: [id number]  HPI  Patient is a 32 y.o. [id number] female who presents for fetal monitoring per order from Roberto Scales, [place].   Objective:    BP (!) 134/93   Pulse 99   Ht 5\' 7"  (1.702 m)   Wt 225 lb 14.4 oz (102.5 kg)   LMP 06/22/2021 (Exact Date)   BMI 35.38 kg/m  Estimated Date of Delivery: 03/29/22  Assessment:   1. Supervision of high risk pregnancy in second trimester   2. Gestational hypertension, third trimester   3. [redacted] weeks gestation of pregnancy      Plan:   Results reviewed and discussed with patient by  Roberto Scales, CNM.     Otila Kluver, LPN

## 2022-02-07 ENCOUNTER — Other Ambulatory Visit: Payer: Medicaid Other

## 2022-02-07 ENCOUNTER — Telehealth: Payer: Self-pay | Admitting: Licensed Practical Nurse

## 2022-02-07 ENCOUNTER — Other Ambulatory Visit: Payer: Self-pay

## 2022-02-07 ENCOUNTER — Ambulatory Visit
Admission: RE | Admit: 2022-02-07 | Discharge: 2022-02-07 | Disposition: A | Discharge status: Home / Self Care | Payer: Medicaid Other | Source: Ambulatory Visit | Attending: Licensed Practical Nurse | Admitting: Licensed Practical Nurse

## 2022-02-07 DIAGNOSIS — O133 Gestational [pregnancy-induced] hypertension without significant proteinuria, third trimester: Secondary | ICD-10-CM | POA: Insufficient documentation

## 2022-02-07 DIAGNOSIS — O139 Gestational [pregnancy-induced] hypertension without significant proteinuria, unspecified trimester: Secondary | ICD-10-CM | POA: Diagnosis not present

## 2022-02-07 DIAGNOSIS — Z3A32 32 weeks gestation of pregnancy: Secondary | ICD-10-CM | POA: Diagnosis present

## 2022-02-07 DIAGNOSIS — O0992 Supervision of high risk pregnancy, unspecified, second trimester: Secondary | ICD-10-CM | POA: Insufficient documentation

## 2022-02-07 LAB — COMPREHENSIVE METABOLIC PANEL
ALT: 7 IU/L (ref 0–32)
AST: 12 IU/L (ref 0–40)
Albumin/Globulin Ratio: 1.4 (ref 1.2–2.2)
Albumin: 3.7 g/dL — ABNORMAL LOW (ref 4.0–5.0)
Alkaline Phosphatase: 112 IU/L (ref 44–121)
BUN/Creatinine Ratio: 11 (ref 9–23)
BUN: 5 mg/dL — ABNORMAL LOW (ref 6–20)
Bilirubin Total: 0.2 mg/dL (ref 0.0–1.2)
CO2: 17 mmol/L — ABNORMAL LOW (ref 20–29)
Calcium: 9 mg/dL (ref 8.7–10.2)
Chloride: 103 mmol/L (ref 96–106)
Creatinine, Ser: 0.46 mg/dL — ABNORMAL LOW (ref 0.57–1.00)
Globulin, Total: 2.7 g/dL (ref 1.5–4.5)
Glucose: 74 mg/dL (ref 70–99)
Potassium: 4.1 mmol/L (ref 3.5–5.2)
Sodium: 136 mmol/L (ref 134–144)
Total Protein: 6.4 g/dL (ref 6.0–8.5)
eGFR: 132 mL/min/{1.73_m2} (ref >59)

## 2022-02-07 LAB — CBC
Hematocrit: 40.9 % (ref 34.0–46.6)
Hemoglobin: 13.9 g/dL (ref 11.1–15.9)
MCH: 32 pg (ref 26.6–33.0)
MCHC: 34 g/dL (ref 31.5–35.7)
MCV: 94 fL (ref 79–97)
Platelets: 264 10*3/uL (ref 150–450)
RBC: 4.35 x10E6/uL (ref 3.77–5.28)
RDW: 13.5 % (ref 11.7–15.4)
WBC: 8.8 10*3/uL (ref 3.4–10.8)

## 2022-02-07 LAB — PROTEIN / CREATININE RATIO, URINE
Creatinine, Urine: 62.1 mg/dL
Protein, Ur: 8.6 mg/dL
Protein/Creat Ratio: 138 mg/g creat (ref 0–200)

## 2022-02-07 NOTE — Progress Notes (Signed)
BPP added to Growth per Roberto Scales for Non Reactive NST.

## 2022-02-07 NOTE — Telephone Encounter (Signed)
I contacted patient via phone. Patient is aware I will be contacting her for rescheduled for ultrasound on Monday once I am able to confirm with ultrasound for rescheduled work in. I was able to adjust and keep rob follow up with NST for the next 3 weeks. Patient approved.

## 2022-02-07 NOTE — Telephone Encounter (Signed)
I contacted patient via phone , no answer.

## 2022-02-07 NOTE — Telephone Encounter (Signed)
Spoke with patient. Advised ultrasound has been rescheduled to today @345pm  [facility].

## 2022-02-07 NOTE — Telephone Encounter (Signed)
I contacted patient via phone. I left voicemail for patient to call back for [place] to rescheduled appointment due to no ultrasound tech 02/07/22.

## 2022-02-11 ENCOUNTER — Encounter: Payer: Self-pay | Admitting: Licensed Practical Nurse

## 2022-02-12 DIAGNOSIS — Z3A33 33 weeks gestation of pregnancy: Secondary | ICD-10-CM | POA: Insufficient documentation

## 2022-02-12 NOTE — Progress Notes (Signed)
    NURSE VISIT NOTE  Subjective:    Patient ID: Jasmine Gray, female    DOB: Jun 18, 1990, 32 y.o.   MRN: [id number]  HPI  Patient is a 32 y.o. [id number] female who presents for fetal monitoring per order from Roberto Scales, [place].   Objective:    BP 132/67   Pulse 85   Ht 5\' 7"  (1.702 m)   Wt 229 lb 9.6 oz (104.1 kg)   LMP 06/22/2021 (Exact Date)   BMI 35.96 kg/m  Estimated Date of Delivery: 03/29/22  Assessment:   1. Supervision of high risk pregnancy in second trimester   2. Gestational hypertension, third trimester   3. [redacted] weeks gestation of pregnancy      Plan:   Results reviewed and discussed with patient by  Jeannie Fend, MD.     Otila Kluver, LPN

## 2022-02-12 NOTE — Patient Instructions (Signed)

## 2022-02-13 ENCOUNTER — Other Ambulatory Visit: Payer: Medicaid Other

## 2022-02-14 ENCOUNTER — Ambulatory Visit (INDEPENDENT_AMBULATORY_CARE_PROVIDER_SITE_OTHER): Payer: Medicaid Other | Admitting: Obstetrics and Gynecology

## 2022-02-14 ENCOUNTER — Ambulatory Visit: Payer: Medicaid Other

## 2022-02-14 ENCOUNTER — Telehealth: Payer: Self-pay | Admitting: Obstetrics and Gynecology

## 2022-02-14 VITALS — BP 132/67 | HR 85 | Wt 229.6 lb

## 2022-02-14 VITALS — BP 132/67 | HR 85 | Ht 67.0 in | Wt 229.6 lb

## 2022-02-14 DIAGNOSIS — O0992 Supervision of high risk pregnancy, unspecified, second trimester: Secondary | ICD-10-CM

## 2022-02-14 DIAGNOSIS — Z3A33 33 weeks gestation of pregnancy: Secondary | ICD-10-CM

## 2022-02-14 DIAGNOSIS — O133 Gestational [pregnancy-induced] hypertension without significant proteinuria, third trimester: Secondary | ICD-10-CM

## 2022-02-14 LAB — POCT URINALYSIS DIPSTICK OB
Bilirubin, UA: NEGATIVE
Blood, UA: NEGATIVE
Glucose, UA: NEGATIVE
Ketones, UA: NEGATIVE
Leukocytes, UA: NEGATIVE
Nitrite, UA: NEGATIVE
POC,PROTEIN,UA: NEGATIVE
Spec Grav, UA: 1.01 (ref 1.010–1.025)
Urobilinogen, UA: 0.2 E.U./dL
pH, UA: 6 (ref 5.0–8.0)

## 2022-02-14 NOTE — Progress Notes (Addendum)
ROB: Doing well.  Taking antihypertensive as directed.  Feels much better.  NST reactive today.  Follow-up growth scan at 36 weeks (history of 15th percentile growth) would like to go as far as possible in gestation without being induced.  I have discussed this as long as her blood pressure remains stable on antihypertensives and her baby's growth does not lag further.

## 2022-02-14 NOTE — Telephone Encounter (Signed)
Reached out to pt to give her the number for Centralized Scheduling.  Left message and gave number to pt.  Asked pt to call back if she had any questions.

## 2022-02-14 NOTE — Progress Notes (Signed)
ROB [redacted]w[redacted]d: NST. Patient reports good fetal movement. Denies, vaginal bleeding leaking of fluid. Feels better/like a different person after starting labetalol.

## 2022-02-15 ENCOUNTER — Encounter: Payer: Self-pay | Admitting: Obstetrics and Gynecology

## 2022-02-20 DIAGNOSIS — Z3A34 34 weeks gestation of pregnancy: Secondary | ICD-10-CM | POA: Insufficient documentation

## 2022-02-20 NOTE — Patient Instructions (Signed)

## 2022-02-20 NOTE — Progress Notes (Signed)
    NURSE VISIT NOTE  Subjective:    Patient ID: Jasmine Gray, female    DOB: [birth year], 32 y.o.   MRN: [id number]  HPI  Patient is a 32 y.o. [phone] female who presents for fetal monitoring per order from Jeannie Fend, [place].   Objective:    BP 122/83   Pulse 89   Ht 5\' 7"  (1.702 m)   Wt 232 lb 1.6 oz (105.3 kg)   LMP 06/22/2021 (Exact Date)   BMI 36.35 kg/m  Estimated Date of Delivery: 03/29/22  Assessment:   1. Supervision of high risk pregnancy in second trimester   2. Gestational hypertension, third trimester   3. [redacted] weeks gestation of pregnancy      Plan:   Results reviewed with Roberto Scales, CNM and discussed with patient. Patient sent to L&D for extended monitoring due to non reactive NST.     Otila Kluver, LPN

## 2022-02-21 ENCOUNTER — Encounter: Payer: Medicaid Other | Admitting: Obstetrics

## 2022-02-21 ENCOUNTER — Other Ambulatory Visit: Payer: Self-pay

## 2022-02-21 ENCOUNTER — Encounter: Payer: Self-pay | Admitting: Obstetrics and Gynecology

## 2022-02-21 ENCOUNTER — Other Ambulatory Visit: Payer: Medicaid Other

## 2022-02-21 ENCOUNTER — Ambulatory Visit: Payer: Medicaid Other

## 2022-02-21 ENCOUNTER — Observation Stay
Admission: EM | Admit: 2022-02-21 | Discharge: 2022-02-21 | Disposition: A | Discharge status: Home / Self Care | Payer: Medicaid Other | Attending: Obstetrics | Admitting: Obstetrics

## 2022-02-21 VITALS — BP 122/83 | HR 89 | Ht 67.0 in | Wt 232.1 lb

## 2022-02-21 DIAGNOSIS — Z79899 Other long term (current) drug therapy: Secondary | ICD-10-CM | POA: Insufficient documentation

## 2022-02-21 DIAGNOSIS — O133 Gestational [pregnancy-induced] hypertension without significant proteinuria, third trimester: Secondary | ICD-10-CM

## 2022-02-21 DIAGNOSIS — O0993 Supervision of high risk pregnancy, unspecified, third trimester: Secondary | ICD-10-CM | POA: Diagnosis not present

## 2022-02-21 DIAGNOSIS — O368330 Maternal care for abnormalities of the fetal heart rate or rhythm, third trimester, not applicable or unspecified: Secondary | ICD-10-CM | POA: Diagnosis not present

## 2022-02-21 DIAGNOSIS — Z87891 Personal history of nicotine dependence: Secondary | ICD-10-CM | POA: Diagnosis not present

## 2022-02-21 DIAGNOSIS — Z3689 Encounter for other specified antenatal screening: Secondary | ICD-10-CM | POA: Diagnosis not present

## 2022-02-21 DIAGNOSIS — Z3A34 34 weeks gestation of pregnancy: Secondary | ICD-10-CM

## 2022-02-21 DIAGNOSIS — O0990 Supervision of high risk pregnancy, unspecified, unspecified trimester: Secondary | ICD-10-CM

## 2022-02-21 DIAGNOSIS — O0992 Supervision of high risk pregnancy, unspecified, second trimester: Secondary | ICD-10-CM

## 2022-02-21 NOTE — Discharge Summary (Signed)
   Please see Final progress note.  Imagene Riches, CNM  02/21/2022 11:58 AM

## 2022-02-21 NOTE — OB Triage Note (Signed)
Pt [id number] [redacted]w[redacted]d sent from office for NST. Pt reports +fm denies leaking of fluid, bleeding or contractions. VSS. Fetal monitors applied. Initial FHT 150. CNM made aware of pt.

## 2022-02-21 NOTE — Final Progress Note (Signed)
Final Progress Note  Patient ID: Jasmine Gray MRN: [id number] DOB/AGE: February 06, 1990 32 y.o.  Admit date: 02/21/2022 Admitting provider: Imagene Riches, CNM Discharge date: 02/21/2022   Admission Diagnoses: [redacted] weeks gestation   Discharge Diagnoses:  Principal Problem:   Indication for care in labor and delivery, antepartum Active Problems:   [redacted] weeks gestation of pregnancy  Category 1 fetal heart tracing  History of Present Illness: The patient is a 32 y.o. female [phone] at [redacted]w[redacted]d who presents for some EFM. She was seen in the office at [facility] today, and the staff had difficulty tracing her baby on the monitor. She was then sent to labor and Delivery for some ongoing monitoring.  Past Medical History:  Diagnosis Date   Acid reflux    on no RX at present   Hemorrhoid 11/2015   starting on past Sat. Pt states it is on the outside   Tobacco abuse     Past Surgical History:  Procedure Laterality Date   NO PAST SURGERIES      No current facility-administered medications on file prior to encounter.   Current Outpatient Medications on File Prior to Encounter  Medication Sig Dispense Refill   labetalol (NORMODYNE) 100 MG tablet Take 1 tablet (100 mg total) by mouth 2 (two) times daily. 30 tablet 1   Prenatal Vit-Fe Fumarate-FA (PRENATAL VITAMINS) 28-0.8 MG TABS Take 1 tablet by mouth daily.     Blood Pressure Monitoring (BLOOD PRESSURE KIT) DEVI 1 kit by Does not apply route 2 (two) times daily. Check blood pressure twice a day 1 each 0   [DISCONTINUED] albuterol (VENTOLIN HFA) 108 (90 Base) MCG/ACT inhaler Inhale 2 puffs into the lungs every 6 (six) hours as needed for wheezing or shortness of breath. 8 g 0   [DISCONTINUED] fluticasone (FLONASE) 50 MCG/ACT nasal spray Place 2 sprays into both nostrils daily. 16 g 6   [DISCONTINUED] metoprolol tartrate (LOPRESSOR) 25 MG tablet Take 0.5 tablets (12.5 mg total) by mouth 2 (two) times daily as needed (Palpitations). (Patient not  taking: Reported on 07/08/2019) 20 tablet 0   [DISCONTINUED] omeprazole (PRILOSEC) 20 MG capsule Take 1 capsule (20 mg total) by mouth daily. 30 capsule 0    Allergies  Allergen Reactions   Penicillins Hives    Social History   Socioeconomic History   Marital status: Significant Other    Spouse name: Not on file   Number of children: 2   Years of education: 99   Highest education level: Not on file  Occupational History   Occupation: stay at home mom  Tobacco Use   Smoking status: Former    Packs/day: 0.25    Years: 5.00    Total pack years: 1.25    Types: Cigarettes    Quit date: 11/04/2021    Years since quitting: 0.2   Smokeless tobacco: Never   Tobacco comments:    On nicotine patch  Vaping Use   Vaping Use: Never used  Substance and Sexual Activity   Alcohol use: No   Drug use: No   Sexual activity: Yes    Partners: Male    Birth control/protection: None  Other Topics Concern   Not on file  Social History Narrative   Not on file   Social Determinants of Health   Financial Resource Strain: Medium Risk (08/23/2021)   Overall Financial Resource Strain (CARDIA)    Difficulty of Paying Living Expenses: Somewhat hard  Food Insecurity: No Food Insecurity (08/23/2021)   Hunger  Vital Sign    Worried About Programme researcher, broadcasting/film/video in the Last Year: Never true    Ran Out of Food in the Last Year: Never true  Transportation Needs: No Transportation Needs (08/23/2021)   PRAPARE - Administrator, Civil Service (Medical): No    Lack of Transportation (Non-Medical): No  Physical Activity: Insufficiently Active (08/23/2021)   Exercise Vital Sign    Days of Exercise per Week: 2 days    Minutes of Exercise per Session: 40 min  Stress: No Stress Concern Present (08/23/2021)   [employer] of Occupational Health - Occupational Stress Questionnaire    Feeling of Stress : Not at all  Social Connections: Unknown (08/23/2021)   Social Connection and Isolation Panel  [NHANES]    Frequency of Communication with Friends and Family: More than three times a week    Frequency of Social Gatherings with Friends and Family: More than three times a week    Attends Religious Services: Never    Database administrator or Organizations: No    Attends Banker Meetings: Never    Marital Status: Not on file  Intimate Partner Violence: Not At Risk (08/23/2021)   Humiliation, Afraid, Rape, and Kick questionnaire    Fear of Current or Ex-Partner: No    Emotionally Abused: No    Physically Abused: No    Sexually Abused: No    Family History  Problem Relation Age of Onset   Hypertension Mother    Migraines Mother    Heart disease Father    Hypertension Father    Congestive Heart Failure Father    Stroke Father    Factor V Leiden deficiency Father    Cancer Maternal Grandmother 44       breast   Alcohol abuse Maternal Grandfather    Hypertension Paternal Grandfather    Depression Neg Hx      ROS   Physical Exam: BP 115/68 (BP Location: Left Arm)   Pulse 87   Temp 98.7 F (37.1 C) (Oral)   Resp 15   Ht 5\' 7"  (1.702 m)   Wt 105.3 kg   LMP 06/22/2021 (Exact Date)   BMI 36.36 kg/m   OBGyn Exam  Consults: None  Significant Findings/ Diagnostic Studies: none  Procedures: EFM NST Baseline FHR: 140 beats/min Variability: moderate Accelerations: present Decelerations: absent Tocometry: no contractions per toco, nor reported by the patient.  Interpretation:  INDICATIONS: patient reassurance and for NST RESULTS:  A NST procedure was performed with FHR monitoring and a normal baseline established, appropriate time of 20-40 minutes of evaluation, and accels >2 seen w 15x15 characteristics.  Results show a REACTIVE NST.    Hospital Course: The patient was admitted to Labor and Delivery Triage for observation. She was monitored and demonstrated a Category 1 tracing. With this reassurance, she was then discharged home.  Discharge Condition:  good  Disposition: Discharge disposition: 01-Home or Self Care       Diet: Regular diet  Discharge Activity: Activity as tolerated  Discharge Instructions     Fetal Kick Count:  Lie on our left side for one hour after a meal, and count the number of times your baby kicks.  If it is less than 5 times, get up, move around and drink some juice.  Repeat the test 30 minutes later.  If it is still less than 5 kicks in an hour, notify your doctor.   Complete by: As directed    LABOR:  When conractions begin, you should start to time them from the beginning of one contraction to the beginning  of the next.  When contractions are 5 - 10 minutes apart or less and have been regular for at least an hour, you should call your health care provider.   Complete by: As directed    Notify physician for bleeding from the vagina   Complete by: As directed    Notify physician for blurring of vision or spots before the eyes   Complete by: As directed    Notify physician for chills or fever   Complete by: As directed    Notify physician for fainting spells, "black outs" or loss of consciousness   Complete by: As directed    Notify physician for increase in vaginal discharge   Complete by: As directed    Notify physician for leaking of fluid   Complete by: As directed    Notify physician for pain or burning when urinating   Complete by: As directed    Notify physician for pelvic pressure (sudden increase)   Complete by: As directed    Notify physician for severe or continued nausea or vomiting   Complete by: As directed    Notify physician for sudden gushing of fluid from the vagina (with or without continued leaking)   Complete by: As directed    Notify physician for sudden, constant, or occasional abdominal pain   Complete by: As directed    Notify physician if baby moving less than usual   Complete by: As directed       Allergies as of 02/21/2022       Reactions   Penicillins Hives         Medication List     TAKE these medications    Blood Pressure Kit Devi 1 kit by Does not apply route 2 (two) times daily. Check blood pressure twice a day   labetalol 100 MG tablet Commonly known as: NORMODYNE Take 1 tablet (100 mg total) by mouth 2 (two) times daily.   Prenatal Vitamins 28-0.8 MG Tabs Take 1 tablet by mouth daily.         Total time spent taking care of this patient: 20 minutes  Signed: Imagene Riches, CNM  02/21/2022, 11:59 AM

## 2022-02-28 ENCOUNTER — Ambulatory Visit (INDEPENDENT_AMBULATORY_CARE_PROVIDER_SITE_OTHER): Payer: Medicaid Other | Admitting: Obstetrics and Gynecology

## 2022-02-28 ENCOUNTER — Other Ambulatory Visit (HOSPITAL_COMMUNITY)
Admission: RE | Admit: 2022-02-28 | Discharge: 2022-02-28 | Disposition: A | Discharge status: Home / Self Care | Payer: Medicaid Other | Source: Ambulatory Visit | Attending: Obstetrics and Gynecology | Admitting: Obstetrics and Gynecology

## 2022-02-28 ENCOUNTER — Observation Stay
Admission: RE | Admit: 2022-02-28 | Discharge: 2022-02-28 | Disposition: A | Discharge status: Home / Self Care | Payer: Medicaid Other | Source: Ambulatory Visit | Attending: Obstetrics | Admitting: Obstetrics

## 2022-02-28 ENCOUNTER — Observation Stay: Payer: Medicaid Other

## 2022-02-28 ENCOUNTER — Encounter: Payer: Self-pay | Admitting: Obstetrics

## 2022-02-28 ENCOUNTER — Observation Stay: Admission: RE | Admit: 2022-02-28 | Payer: Medicaid Other | Source: Ambulatory Visit

## 2022-02-28 ENCOUNTER — Other Ambulatory Visit: Payer: Self-pay

## 2022-02-28 ENCOUNTER — Ambulatory Visit: Payer: Medicaid Other

## 2022-02-28 VITALS — BP 142/91 | HR 80 | Wt 234.8 lb

## 2022-02-28 VITALS — BP 142/91 | HR 80 | Ht 67.0 in | Wt 234.8 lb

## 2022-02-28 DIAGNOSIS — O0990 Supervision of high risk pregnancy, unspecified, unspecified trimester: Secondary | ICD-10-CM

## 2022-02-28 DIAGNOSIS — Z3A35 35 weeks gestation of pregnancy: Secondary | ICD-10-CM

## 2022-02-28 DIAGNOSIS — O368330 Maternal care for abnormalities of the fetal heart rate or rhythm, third trimester, not applicable or unspecified: Principal | ICD-10-CM | POA: Insufficient documentation

## 2022-02-28 DIAGNOSIS — O99213 Obesity complicating pregnancy, third trimester: Secondary | ICD-10-CM

## 2022-02-28 DIAGNOSIS — O288 Other abnormal findings on antenatal screening of mother: Secondary | ICD-10-CM

## 2022-02-28 DIAGNOSIS — Z8751 Personal history of pre-term labor: Secondary | ICD-10-CM

## 2022-02-28 DIAGNOSIS — Z113 Encounter for screening for infections with a predominantly sexual mode of transmission: Secondary | ICD-10-CM | POA: Insufficient documentation

## 2022-02-28 DIAGNOSIS — O133 Gestational [pregnancy-induced] hypertension without significant proteinuria, third trimester: Secondary | ICD-10-CM | POA: Diagnosis not present

## 2022-02-28 DIAGNOSIS — Z3685 Encounter for antenatal screening for Streptococcus B: Secondary | ICD-10-CM

## 2022-02-28 MED ORDER — LACTATED RINGERS IV SOLN
500.0000 mL | INTRAVENOUS | Status: DC | PRN
  Administered 2022-02-28: 1000 mL via INTRAVENOUS

## 2022-02-28 MED ORDER — ACETAMINOPHEN 325 MG PO TABS: 650.0000 mg | ORAL_TABLET | ORAL | Status: DC | PRN

## 2022-02-28 MED ORDER — SOD CITRATE-CITRIC ACID 500-334 MG/5ML PO SOLN: 30.0000 mL | ORAL | Status: DC | PRN

## 2022-02-28 NOTE — Patient Instructions (Signed)

## 2022-02-28 NOTE — Progress Notes (Signed)
ROB [redacted]w[redacted]d: NST/Cultures today. Patient reports good fetal movement. She had a headache on Tuesday relieved with Tylenol. Denies blurred vision/elevated BP.

## 2022-02-28 NOTE — Progress Notes (Signed)
Pt off unit for US

## 2022-02-28 NOTE — Progress Notes (Signed)
ROB: Doing well, no major issues. Noted a headache 2 days ago that resolved with Tylenol. Taking Labetalol 100 mg BID with no side effects.  NST performed today was reviewed but was non-reactive.  Will send to hospital for further monitoring.  This also occurred last week. If this continues, can schedule weekly fetal monitoring at hospital (NST or BPP). Continue recommended antenatal testing and prenatal care.  Plan will be for IOL around 37-38 weeks due to HTN in pregnancy on meds.  Last growth scan 15%ile on 02/07/22.  Is due for next scan on 1/30. Patient allowed to self-collect 36 week cultures. RTC in 1 week.    Fetus A Non-Stress Test Interpretation for 02/28/22  Indication:  Gestational HTN  Fetal Heart Rate A Mode: External Baseline Rate (A): 145 bpm Accelerations: 10 x 10 (x 1) Decelerations: None Multiple birth?: No  Uterine Activity Mode: Toco Contraction Frequency (min): None  Interpretation (Fetal Testing) Nonstress Test Interpretation: Non-reactive Overall Impression: Non-reassuring

## 2022-02-28 NOTE — OB Triage Note (Signed)
LABOR & DELIVERY OB TRIAGE NOTE  SUBJECTIVE  HPI Jasmine Gray is a 32 y.o. [id number] at [redacted]w[redacted]d who presents to Labor & Delivery for extended monitoring following a non-reactive NST in the office. Her tracing was initially Category 1 but non-reactive in triage. She orally hydrated and received a fluid bolus. The tracing did become reactive, and she had a BPP 8/8.  OB History     Gravida  4   Para  2   Term  1   Preterm  1   AB  1   Living  2      SAB  1   IAB      Ectopic      Multiple  0   Live Births  2        Obstetric Comments  States at beginning of pregnancy felt light headed and ill, resolved         Scheduled Meds: Continuous Infusions:  lactated ringers Stopped (02/28/22 1330)   PRN Meds:.acetaminophen, lactated ringers, sodium citrate-citric acid  OBJECTIVE  BP 129/68 (BP Location: Left Arm)   Pulse 74   Temp 98.2 F (36.8 C) (Oral)   Resp 16   Ht 5\' 7"  (1.702 m)   Wt 106.1 kg   LMP 06/22/2021 (Exact Date)   BMI 36.65 kg/m    NST I reviewed the NST and it was reactive.  Baseline: 130 Variability: moderate Accelerations: present Decelerations:none Toco: no ctx Category 1  ASSESSMENT Impression  1) Pregnancy at [id number], [redacted]w[redacted]d, Estimated Date of Delivery: 03/29/22 2) Reassuring maternal/fetal status 3) BPP 8/8 plus reactive NST  PLAN 1) Discharge home with standard return/labor precautions 2) Keep scheduled ROB appts 3) Discussed findings with Dr. Marcelline Mates. Will schedule f/u BPP   Lloyd Huger, CNM 02/28/22  2:49 PM

## 2022-02-28 NOTE — OB Triage Note (Signed)
Pt sent from office for monitoring

## 2022-02-28 NOTE — Progress Notes (Signed)
    NURSE VISIT NOTE  Subjective:    Patient ID: MICCA MATURA, female    DOB: [birth year], 32 y.o.   MRN: [id number]  HPI  Patient is a 32 y.o. [phone] female who presents for fetal monitoring per order from Jeannie Fend, MD.   Objective:    BP (!) 142/91   Pulse 80   Ht 5\' 7"  (1.702 m)   Wt 234 lb 12.8 oz (106.5 kg)   LMP 06/22/2021 (Exact Date)   BMI 36.77 kg/m  Estimated Date of Delivery: 03/29/22  Assessment:   1. Supervision of high risk pregnancy, antepartum   2. Gestational hypertension, third trimester   3. [redacted] weeks gestation of pregnancy      Plan:   Results reviewed and discussed with patient by  Rubie Maid, MD. NST non-reactive. Patient sent to L&D for extended monitoring.    Otila Kluver, LPN

## 2022-03-01 LAB — CERVICOVAGINAL ANCILLARY ONLY
Chlamydia: NEGATIVE
Comment: NEGATIVE
Comment: NORMAL
Neisseria Gonorrhea: NEGATIVE

## 2022-03-02 ENCOUNTER — Inpatient Hospital Stay: Payer: Medicaid Other

## 2022-03-02 ENCOUNTER — Inpatient Hospital Stay
Admission: EM | Admit: 2022-03-02 | Discharge: 2022-03-05 | DRG: 786 | Disposition: A | Discharge status: Home / Self Care | Payer: Medicaid Other | Attending: Licensed Practical Nurse | Admitting: Licensed Practical Nurse

## 2022-03-02 ENCOUNTER — Inpatient Hospital Stay: Payer: Medicaid Other | Admitting: Certified Registered"

## 2022-03-02 DIAGNOSIS — O45023 Premature separation of placenta with disseminated intravascular coagulation, third trimester: Secondary | ICD-10-CM | POA: Diagnosis present

## 2022-03-02 DIAGNOSIS — O364XX0 Maternal care for intrauterine death, not applicable or unspecified: Principal | ICD-10-CM | POA: Diagnosis present

## 2022-03-02 DIAGNOSIS — O134 Gestational [pregnancy-induced] hypertension without significant proteinuria, complicating childbirth: Secondary | ICD-10-CM | POA: Diagnosis present

## 2022-03-02 DIAGNOSIS — O323XX0 Maternal care for face, brow and chin presentation, not applicable or unspecified: Secondary | ICD-10-CM | POA: Diagnosis present

## 2022-03-02 DIAGNOSIS — Z3A36 36 weeks gestation of pregnancy: Secondary | ICD-10-CM

## 2022-03-02 DIAGNOSIS — O4593 Premature separation of placenta, unspecified, third trimester: Secondary | ICD-10-CM | POA: Diagnosis not present

## 2022-03-02 DIAGNOSIS — Z3A00 Weeks of gestation of pregnancy not specified: Secondary | ICD-10-CM | POA: Diagnosis not present

## 2022-03-02 DIAGNOSIS — Z880 Allergy status to penicillin: Secondary | ICD-10-CM

## 2022-03-02 DIAGNOSIS — D62 Acute posthemorrhagic anemia: Secondary | ICD-10-CM | POA: Diagnosis not present

## 2022-03-02 DIAGNOSIS — O9902 Anemia complicating childbirth: Secondary | ICD-10-CM | POA: Diagnosis present

## 2022-03-02 DIAGNOSIS — Z87891 Personal history of nicotine dependence: Secondary | ICD-10-CM | POA: Diagnosis not present

## 2022-03-02 DIAGNOSIS — O3680X0 Pregnancy with inconclusive fetal viability, not applicable or unspecified: Secondary | ICD-10-CM | POA: Diagnosis not present

## 2022-03-02 LAB — COMPREHENSIVE METABOLIC PANEL
ALT: 10 U/L (ref 0–44)
AST: 15 U/L (ref 15–41)
Albumin: 2.8 g/dL — ABNORMAL LOW (ref 3.5–5.0)
Alkaline Phosphatase: 103 U/L (ref 38–126)
Anion gap: 10 (ref 5–15)
BUN: 11 mg/dL (ref 6–20)
CO2: 22 mmol/L (ref 22–32)
Calcium: 9.2 mg/dL (ref 8.9–10.3)
Chloride: 103 mmol/L (ref 98–111)
Creatinine, Ser: 0.8 mg/dL (ref 0.44–1.00)
GFR, Estimated: >60 mL/min (ref >60)
Glucose, Bld: 99 mg/dL (ref 70–99)
Potassium: 3.9 mmol/L (ref 3.5–5.1)
Sodium: 135 mmol/L (ref 135–145)
Total Bilirubin: 0.3 mg/dL (ref 0.3–1.2)
Total Protein: 6.3 g/dL — ABNORMAL LOW (ref 6.5–8.1)

## 2022-03-02 LAB — CBC
HCT: 34.3 % — ABNORMAL LOW (ref 36.0–46.0)
Hemoglobin: 11.4 g/dL — ABNORMAL LOW (ref 12.0–15.0)
MCH: 31.8 pg (ref 26.0–34.0)
MCHC: 33.2 g/dL (ref 30.0–36.0)
MCV: 95.5 fL (ref 80.0–100.0)
Platelets: 160 10*3/uL (ref 150–400)
RBC: 3.59 MIL/uL — ABNORMAL LOW (ref 3.87–5.11)
RDW: 13.4 % (ref 11.5–15.5)
WBC: 15.2 10*3/uL — ABNORMAL HIGH (ref 4.0–10.5)
nRBC: 0 % (ref 0.0–0.2)

## 2022-03-02 MED ORDER — LIDOCAINE-EPINEPHRINE (PF) 1.5 %-1:200000 IJ SOLN
INTRAMUSCULAR | Status: DC | PRN
  Administered 2022-03-02: 3 mL via PERINEURAL

## 2022-03-02 MED ORDER — FENTANYL-BUPIVACAINE-NACL 0.5-0.125-0.9 MG/250ML-% EP SOLN
EPIDURAL | Status: AC
  Filled 2022-03-02: qty 250

## 2022-03-02 MED ORDER — LACTATED RINGERS IV BOLUS: 500.0000 mL | Freq: Once | INTRAVENOUS | Status: DC

## 2022-03-02 MED ORDER — LACTATED RINGERS IV SOLN
500.0000 mL | INTRAVENOUS | Status: DC | PRN
  Administered 2022-03-03: 500 mL via INTRAVENOUS

## 2022-03-02 MED ORDER — MISOPROSTOL 50MCG HALF TABLET
50.0000 ug | ORAL_TABLET | ORAL | Status: DC | PRN
  Administered 2022-03-03: 50 ug via VAGINAL
  Filled 2022-03-02 (×2): qty 1

## 2022-03-02 MED ORDER — DIPHENHYDRAMINE HCL 50 MG/ML IJ SOLN: 12.5000 mg | INTRAMUSCULAR | Status: DC | PRN

## 2022-03-02 MED ORDER — MISOPROSTOL 50MCG HALF TABLET: 50.0000 ug | ORAL_TABLET | Freq: Once | ORAL | Status: AC

## 2022-03-02 MED ORDER — OXYTOCIN-SODIUM CHLORIDE 30-0.9 UT/500ML-% IV SOLN: 1.0000 m[IU]/min | INTRAVENOUS | Status: DC

## 2022-03-02 MED ORDER — MISOPROSTOL 50MCG HALF TABLET
ORAL_TABLET | ORAL | Status: AC
  Administered 2022-03-02: 50 ug via ORAL
  Administered 2022-03-02: 50 ug via VAGINAL
  Filled 2022-03-02: qty 2

## 2022-03-02 MED ORDER — OXYTOCIN-SODIUM CHLORIDE 30-0.9 UT/500ML-% IV SOLN
2.5000 [IU]/h | INTRAVENOUS | Status: DC
  Administered 2022-03-03: 2.5 [IU]/h via INTRAVENOUS
  Filled 2022-03-02: qty 500

## 2022-03-02 MED ORDER — FENTANYL-BUPIVACAINE-NACL 0.5-0.125-0.9 MG/250ML-% EP SOLN
EPIDURAL | Status: DC | PRN
  Administered 2022-03-02: 12 mL/h via EPIDURAL

## 2022-03-02 MED ORDER — SODIUM CHLORIDE (PF) 0.9 % IJ SOLN
INTRAMUSCULAR | Status: AC
  Filled 2022-03-02: qty 50

## 2022-03-02 MED ORDER — BUPIVACAINE HCL (PF) 0.5 % IJ SOLN
INTRAMUSCULAR | Status: AC
  Filled 2022-03-02: qty 30

## 2022-03-02 MED ORDER — LIDOCAINE HCL (PF) 1 % IJ SOLN: 30.0000 mL | INTRAMUSCULAR | Status: DC | PRN

## 2022-03-02 MED ORDER — OXYTOCIN BOLUS FROM INFUSION: 333.0000 mL | Freq: Once | INTRAVENOUS | Status: DC

## 2022-03-02 MED ORDER — SOD CITRATE-CITRIC ACID 500-334 MG/5ML PO SOLN: 30.0000 mL | ORAL | Status: DC | PRN

## 2022-03-02 MED ORDER — CEFAZOLIN SODIUM-DEXTROSE 2-4 GM/100ML-% IV SOLN
INTRAVENOUS | Status: AC
  Filled 2022-03-02: qty 100

## 2022-03-02 MED ORDER — LACTATED RINGERS IV SOLN: INTRAVENOUS | Status: DC | PRN

## 2022-03-02 MED ORDER — EPHEDRINE 5 MG/ML INJ
INTRAVENOUS | Status: AC
  Filled 2022-03-02: qty 5

## 2022-03-02 MED ORDER — BUPIVACAINE HCL (PF) 0.25 % IJ SOLN
INTRAMUSCULAR | Status: DC | PRN
  Administered 2022-03-02: 5 mL via EPIDURAL

## 2022-03-02 MED ORDER — CALCIUM CARBONATE ANTACID 500 MG PO CHEW
200.0000 mg | CHEWABLE_TABLET | Freq: Four times a day (QID) | ORAL | Status: DC | PRN
  Administered 2022-03-03: 200 mg via ORAL
  Filled 2022-03-02: qty 1

## 2022-03-02 MED ORDER — LACTATED RINGERS IV SOLN: 500.0000 mL | Freq: Once | INTRAVENOUS | Status: DC

## 2022-03-02 MED ORDER — ONDANSETRON HCL 4 MG/2ML IJ SOLN
4.0000 mg | Freq: Four times a day (QID) | INTRAMUSCULAR | Status: DC | PRN
  Administered 2022-03-02: 4 mg via INTRAVENOUS
  Filled 2022-03-02: qty 2

## 2022-03-02 MED ORDER — LACTATED RINGERS IV SOLN: INTRAVENOUS | Status: DC

## 2022-03-02 MED ORDER — TRANEXAMIC ACID-NACL 1000-0.7 MG/100ML-% IV SOLN
INTRAVENOUS | Status: AC
  Filled 2022-03-02: qty 100

## 2022-03-02 MED ORDER — PHENYLEPHRINE 80 MCG/ML (10ML) SYRINGE FOR IV PUSH (FOR BLOOD PRESSURE SUPPORT): 80.0000 ug | PREFILLED_SYRINGE | INTRAVENOUS | Status: DC | PRN

## 2022-03-02 MED ORDER — ONDANSETRON HCL 4 MG/2ML IJ SOLN: 4.0000 mg | Freq: Four times a day (QID) | INTRAMUSCULAR | Status: DC | PRN

## 2022-03-02 MED ORDER — CARBOPROST TROMETHAMINE 250 MCG/ML IM SOLN
INTRAMUSCULAR | Status: AC
  Filled 2022-03-02: qty 1

## 2022-03-02 MED ORDER — EPHEDRINE 5 MG/ML INJ
10.0000 mg | INTRAVENOUS | Status: DC | PRN
  Administered 2022-03-02: 10 mg via INTRAVENOUS

## 2022-03-02 MED ORDER — TERBUTALINE SULFATE 1 MG/ML IJ SOLN: 0.2500 mg | Freq: Once | INTRAMUSCULAR | Status: DC | PRN

## 2022-03-02 MED ORDER — LIDOCAINE 5 % EX PTCH
MEDICATED_PATCH | CUTANEOUS | Status: AC
  Filled 2022-03-02: qty 1

## 2022-03-02 MED ORDER — LIDOCAINE HCL (PF) 1 % IJ SOLN
INTRAMUSCULAR | Status: DC | PRN
  Administered 2022-03-02 (×3): 3 mL

## 2022-03-02 MED ORDER — HYDROXYZINE HCL 25 MG PO TABS: 50.0000 mg | ORAL_TABLET | Freq: Four times a day (QID) | ORAL | Status: DC | PRN

## 2022-03-02 MED ORDER — EPHEDRINE 5 MG/ML INJ: 10.0000 mg | INTRAVENOUS | Status: DC | PRN

## 2022-03-02 MED ORDER — FENTANYL-BUPIVACAINE-NACL 0.5-0.125-0.9 MG/250ML-% EP SOLN: 12.0000 mL/h | EPIDURAL | Status: DC | PRN

## 2022-03-02 MED ORDER — SOD CITRATE-CITRIC ACID 500-334 MG/5ML PO SOLN
ORAL | Status: AC
  Filled 2022-03-02: qty 15

## 2022-03-02 MED ORDER — FENTANYL CITRATE (PF) 100 MCG/2ML IJ SOLN
50.0000 ug | INTRAMUSCULAR | Status: DC | PRN
  Administered 2022-03-02: 100 ug via INTRAVENOUS
  Filled 2022-03-02: qty 2

## 2022-03-02 MED ORDER — OXYTOCIN-SODIUM CHLORIDE 30-0.9 UT/500ML-% IV SOLN
INTRAVENOUS | Status: AC
  Filled 2022-03-02: qty 500

## 2022-03-02 MED ORDER — OXYTOCIN-SODIUM CHLORIDE 30-0.9 UT/500ML-% IV SOLN
1.0000 m[IU]/min | INTRAVENOUS | Status: DC
  Administered 2022-03-03 (×2): 15000 [IU] via INTRAVENOUS
  Administered 2022-03-03: 4 m[IU]/min via INTRAVENOUS
  Filled 2022-03-02: qty 500

## 2022-03-02 NOTE — Anesthesia Preprocedure Evaluation (Signed)
Anesthesia Evaluation  Patient identified by MRN, date of birth, ID band Patient awake    Reviewed: Allergy & Precautions, H&P , NPO status , Patient's Chart, lab work & pertinent test results  History of Anesthesia Complications Negative for: history of anesthetic complications  Airway Mallampati: II  TM Distance: >3 FB Neck ROM: full    Dental  (+) Chipped, Poor Dentition, Missing   Pulmonary neg shortness of breath, Current Smoker, former smoker   Pulmonary exam normal        Cardiovascular hypertension, Normal cardiovascular exam     Neuro/Psych negative neurological ROS  negative psych ROS   GI/Hepatic negative GI ROS, Neg liver ROS,GERD  ,,  Endo/Other  negative endocrine ROS    Renal/GU negative Renal ROS  negative genitourinary   Musculoskeletal   Abdominal   Peds  Hematology  (+) Blood dyscrasia   Anesthesia Other Findings Placental abruption and DIC Past Medical History: No date: Acid reflux     Comment:  on no RX at present 11/2015: Hemorrhoid     Comment:  starting on past Sat. Pt states it is on the outside No date: Tobacco abuse  Past Surgical History: No date: NO PAST SURGERIES  BMI    Body Mass Index: 36.77 kg/m      Reproductive/Obstetrics negative OB ROS (+) Pregnancy                             Anesthesia Physical Anesthesia Plan  ASA: 5 and emergent  Anesthesia Plan: General ETT and Rapid Sequence   Post-op Pain Management:    Induction: Intravenous  PONV Risk Score and Plan: Ondansetron, Dexamethasone, Midazolam and Treatment may vary due to age or medical condition  Airway Management Planned: Oral ETT  Additional Equipment:   Intra-op Plan:   Post-operative Plan: Extubation in OR  Informed Consent: I have reviewed the patients History and Physical, chart, labs and discussed the procedure including the risks, benefits and alternatives for  the proposed anesthesia with the patient or authorized representative who has indicated his/her understanding and acceptance.     Dental Advisory Given  Plan Discussed with: Anesthesiologist, CRNA and Surgeon  Anesthesia Plan Comments: (Patient consented for risks of anesthesia including but not limited to:  - adverse reactions to medications - damage to eyes, teeth, lips or other oral mucosa - nerve damage due to positioning  - sore throat or hoarseness - Damage to heart, brain, nerves, lungs, other parts of body or loss of life  Patient voiced understanding.)       Anesthesia Quick Evaluation

## 2022-03-02 NOTE — H&P (Signed)
OB History & Physical   History of Present Illness:  Chief Complaint:   HPI:  Jasmine Gray is a 32 y.o. [id number] female at [redacted]w[redacted]d dated by 8wk [place].  She presents to L&D for contraction/abdominal pain, vomiting and feeling dizzy.  This evening she started to feel contraction pain, she got in the shower to see if it would improve but the pain got worse, once in the ED she felt dizzy like she was going to faint and she vomited.  She was brought up to triage immediately, on arrival she reported being a lot of pain and "not feeling well". Reports feeling FM today on the way to the ED and on her way up to triage. Denies any LOF/VB.   RN called out as she was unable to auscultate fetal heartrate. BSUS brought to room, fetal heart visualized, movement not noted, absence of  fetal heart rate confirmed by hospital [place] technician.   Jasmine Gray's partner and family members present    Pregnancy Issues: 1. Fetal demise 2. GHTN 3. BMI 36.65   Maternal Medical History:   Past Medical History:  Diagnosis Date   Acid reflux    on no RX at present   Hemorrhoid 11/2015   starting on past Sat. Pt states it is on the outside   Tobacco abuse     Past Surgical History:  Procedure Laterality Date   NO PAST SURGERIES      Allergies  Allergen Reactions   Penicillins Hives    Prior to Admission medications   Medication Sig Start Date End Date Taking? Authorizing Provider  Blood Pressure Monitoring (BLOOD PRESSURE KIT) DEVI 1 kit by Does not apply route 2 (two) times daily. Check blood pressure twice a day Patient not taking: Reported on 02/28/2022 02/06/22   Allen Derry, CNM  labetalol (NORMODYNE) 100 MG tablet Take 1 tablet (100 mg total) by mouth 2 (two) times daily. 02/06/22   Brand Siever, Jasmine Gray, CNM  Prenatal Vit-Fe Fumarate-FA (PRENATAL VITAMINS) 28-0.8 MG TABS Take 1 tablet by mouth daily.    [provider]  albuterol (VENTOLIN HFA) 108 (90 Base) MCG/ACT inhaler Inhale 2 puffs into the  lungs every 6 (six) hours as needed for wheezing or shortness of breath. 02/14/20 04/22/20  Sharion Balloon, FNP  fluticasone (FLONASE) 50 MCG/ACT nasal spray Place 2 sprays into both nostrils daily. 02/14/20 04/22/20  Sharion Balloon, FNP  metoprolol tartrate (LOPRESSOR) 25 MG tablet Take 0.5 tablets (12.5 mg total) by mouth 2 (two) times daily as needed (Palpitations). Patient not taking: Reported on 07/08/2019 03/29/19 04/22/20  Duffy Bruce, MD  omeprazole (PRILOSEC) 20 MG capsule Take 1 capsule (20 mg total) by mouth daily. 07/17/19 04/22/20  McVey, Gelene Mink, PA-C     Prenatal care site: [facility]   Social History: She  reports that she quit smoking about 3 months ago. Her smoking use included cigarettes. She has a 1.25 pack-year smoking history. She has never used smokeless tobacco. She reports that she does not drink alcohol and does not use drugs.  Family History: family history includes Alcohol abuse in her maternal grandfather; Cancer (age of onset: 52) in her maternal grandmother; Congestive Heart Failure in her father; Factor V Leiden deficiency in her father; Heart disease in her father; Hypertension in her father, mother, and paternal grandfather; Migraines in her mother; Stroke in her father.   Review of Systems: A full review of systems was performed and negative except as noted in the  HPI.     Physical Exam:  Vital Signs: LMP 06/22/2021 (Exact Date)  General: no acute distress.  HEENT: normocephalic, atraumatic Heart: regular rate & rhythm.  No murmurs/rubs/gallops Lungs: clear to auscultation bilaterally, normal respiratory effort Abdomen: soft, gravid, non-tender;  EFW: 6lbs Pelvic:   External: Normal external female genitalia  Cervix: Dilation: 1 / Effacement (%): 50 / Station: -2    Extremities: non-tender, symmetric, trace edema bilaterally.   Neurologic: Alert & oriented x 3.    Results for orders placed or performed during the hospital encounter of  03/02/22 (from the past 24 hour(s))  CBC     Status: Abnormal   Collection Time: 03/02/22  8:43 PM  Result Value Ref Range   WBC 15.2 (H) 4.0 - 10.5 K/uL   RBC 3.59 (L) 3.87 - 5.11 MIL/uL   Hemoglobin 11.4 (L) 12.0 - 15.0 g/dL   HCT 34.3 (L) 36.0 - 46.0 %   MCV 95.5 80.0 - 100.0 fL   MCH 31.8 26.0 - 34.0 pg   MCHC 33.2 30.0 - 36.0 g/dL   RDW 13.4 11.5 - 15.5 %   Platelets 160 150 - 400 K/uL   nRBC 0.0 0.0 - 0.2 %  Type and screen [place]     Status: None (Preliminary result)   Collection Time: 03/02/22  8:43 PM  Result Value Ref Range   ABO/RH(D) PENDING    Antibody Screen PENDING    Sample Expiration      03/05/2022,2359 Performed at [facility], [address] [place], [place] [address]   Comprehensive metabolic panel     Status: Abnormal   Collection Time: 03/02/22  8:43 PM  Result Value Ref Range   Sodium 135 135 - 145 mmol/L   Potassium 3.9 3.5 - 5.1 mmol/L   Chloride 103 98 - 111 mmol/L   CO2 22 22 - 32 mmol/L   Glucose, Bld 99 70 - 99 mg/dL   BUN 11 6 - 20 mg/dL   Creatinine, Ser 0.80 0.44 - 1.00 mg/dL   Calcium 9.2 8.9 - 10.3 mg/dL   Total Protein 6.3 (L) 6.5 - 8.1 g/dL   Albumin 2.8 (L) 3.5 - 5.0 g/dL   AST 15 15 - 41 U/L   ALT 10 0 - 44 U/L   Alkaline Phosphatase 103 38 - 126 U/L   Total Bilirubin 0.3 0.3 - 1.2 mg/dL   GFR, Estimated >60 >60 mL/min   Anion gap 10 5 - 15    Pertinent Results:  Prenatal Labs: Blood type/Rh A+  Antibody screen neg  Rubella Immune  Varicella Immune  RPR NR  HBsAg Neg  HIV NR  GC neg  Chlamydia neg  Genetic screening negative  1 hour GTT 114  3 hour GTT   GBS Pending     SVE:  Dilation: 1 / Effacement (%): 15 / Station: -2    Cephalic by leopolds  US OB Limited  Result Date: 03/02/2022 CLINICAL DATA:  Third trimester pregnancy with absent fetal heart tones EXAM: LIMITED OBSTETRIC ULTRASOUND COMPARISON:  02/28/2022 FINDINGS: Number of Fetuses: 1 Heart Rate: Absent Movement:  Absent Presentation: Cephalic Placental Location: Anterior Previa: No Amniotic Fluid (Subjective):  Within normal limits. AFI: 13.5 cm BPD: 8.5 cm 34 w  1 d IMPRESSION: Absent fetal cardiac motion consistent with fetal demise. Critical Value/emergent results were called by telephone at the time of interpretation on 03/02/2022 at 9:24 pm to [facility] , who verbally acknowledged these results. Electronically  Signed   By: Alcide Clever M.D.   On: 03/02/2022 21:26   US FETAL BPP WO NON STRESS  Result Date: 02/28/2022 CLINICAL DATA:  Third trimester pregnancy. Nonreactive nonstress test. EXAM: LIMITED OBSTETRIC ULTRASOUND AND BIOPHYSICAL PROFILE FINDINGS: Number of Fetuses: 1 Heart Rate:  132 bpm Movement: Yes Presentation: Cephalic Previa: No Placental Location: Anterior Amniotic Fluid (Subjective): Within normal limits AFI 15.9 cm (5%ile= 7.9 cm, 95%= 24.9 cm for 35 wks) BPD:  8.5cm 34w tod Maternal Findings: Cervix:  3.2 cm TA BIOPHYSICAL PROFILE Movement: 2 time: 11 minutes Breathing: 2 Tone:  2 Amniotic Fluid: 2 Total Score:  8 IMPRESSION: Single living intrauterine fetus in cephalic presentation. Amniotic fluid volume within normal limits, with AFI of 15.9 cm. Biophysical profile score is 8 out of 8. Electronically Signed   By: Danae Orleans M.D.   On: 02/28/2022 14:31   US OB Limited  Result Date: 02/28/2022 CLINICAL DATA:  Third trimester pregnancy. Nonreactive nonstress test. EXAM: LIMITED OBSTETRIC ULTRASOUND AND BIOPHYSICAL PROFILE FINDINGS: Number of Fetuses: 1 Heart Rate:  132 bpm Movement: Yes Presentation: Cephalic Previa: No Placental Location: Anterior Amniotic Fluid (Subjective): Within normal limits AFI 15.9 cm (5%ile= 7.9 cm, 95%= 24.9 cm for 35 wks) BPD:  8.5cm 34w tod Maternal Findings: Cervix:  3.2 cm TA BIOPHYSICAL PROFILE Movement: 2 time: 11 minutes Breathing: 2 Tone:  2 Amniotic Fluid: 2 Total Score:  8 IMPRESSION: Single living intrauterine fetus in cephalic presentation. Amniotic  fluid volume within normal limits, with AFI of 15.9 cm. Biophysical profile score is 8 out of 8. Electronically Signed   By: Danae Orleans M.D.   On: 02/28/2022 14:31   US Fetal BPP W/O Non Stress  Result Date: 02/07/2022 CLINICAL DATA:  Growth, small for gestational age. EXAM: OBSTETRICAL ULTRASOUND >14 WKS AND BIOPHYSICAL PROFILE FINDINGS: Number of Fetuses: 1 Heart Rate:  141 bpm Movement: Yes Presentation: Cephalic Previa: No Placental Location: Anterior Amniotic Fluid (Subjective): Normal Amniotic Fluid (Objective): Vertical pocket 5.0cm AFI 12.6 cm (5%ile= 8.5 cm, 95%= 24.5 cm for  wks) FETAL BIOMETRY BPD:  7.9cm 31w 5d HC:    29.1cm 32w 0d AC:   27.6cm 31w 5d FL:   6.2cm 32w 1d Current Mean GA: 31w 6d [place] EDC: 04/05/2022 Assigned GA: 32w 6d Assigned EDC: 03/29/2022 Estimated Fetal Weight:  1850g 15%ile FETAL ANATOMY Lateral Ventricles: Appears normal Thalami/CSP: Appears normal Posterior Fossa: Appears normal Nuchal Region: Appears normal    NFT= N/A > 20 WKS Upper Lip: Appears normal Spine: Not visualized 4 Chamber Heart on Left: Appears normal LVOT: Appears normal RVOT: Appears normal Stomach on Left: Appears normal 3 Vessel Cord: Appears normal Cord Insertion site: Appears normal Kidneys: Appears normal Bladder: Appears normal Extremities: Appears normal Sex: Female Technical Limitations: Advanced gestational age Maternal Findings: Cervix:  4.5 cm and closed BIOPHYSICAL PROFILE Movement: 2 Time: 20 minutes Breathing: 2 Tone:  2 Amniotic Fluid: 2 Total Score:  8 IMPRESSION: 1. Single live intrauterine gestation with approximate gestational age of [redacted] weeks and 6 days, EDC based on today's sonogram is 04/05/2022. 2. Estimated fetal weight 1850 g, 15th percentile. 3. Biophysical profile score is 8 out of 8. Electronically Signed   By: Larose Hires D.O.   On: 02/07/2022 23:59   US OB Comp + 14 Wk  Result Date: 02/07/2022 CLINICAL DATA:  Growth, small for gestational age. EXAM: OBSTETRICAL ULTRASOUND >14 WKS  AND BIOPHYSICAL PROFILE FINDINGS: Number of Fetuses: 1 Heart Rate:  141 bpm Movement: Yes Presentation:  Cephalic Previa: No Placental Location: Anterior Amniotic Fluid (Subjective): Normal Amniotic Fluid (Objective): Vertical pocket 5.0cm AFI 12.6 cm (5%ile= 8.5 cm, 95%= 24.5 cm for  wks) FETAL BIOMETRY BPD:  7.9cm 31w 5d HC:    29.1cm 32w 0d AC:   27.6cm 31w 5d FL:   6.2cm 32w 1d Current Mean GA: 31w 6d [place] EDC: 04/05/2022 Assigned GA: 32w 6d Assigned EDC: 03/29/2022 Estimated Fetal Weight:  1850g 15%ile FETAL ANATOMY Lateral Ventricles: Appears normal Thalami/CSP: Appears normal Posterior Fossa: Appears normal Nuchal Region: Appears normal    NFT= N/A > 20 WKS Upper Lip: Appears normal Spine: Not visualized 4 Chamber Heart on Left: Appears normal LVOT: Appears normal RVOT: Appears normal Stomach on Left: Appears normal 3 Vessel Cord: Appears normal Cord Insertion site: Appears normal Kidneys: Appears normal Bladder: Appears normal Extremities: Appears normal Sex: Female Technical Limitations: Advanced gestational age Maternal Findings: Cervix:  4.5 cm and closed BIOPHYSICAL PROFILE Movement: 2 Time: 20 minutes Breathing: 2 Tone:  2 Amniotic Fluid: 2 Total Score:  8 IMPRESSION: 1. Single live intrauterine gestation with approximate gestational age of [redacted] weeks and 6 days, EDC based on today's sonogram is 04/05/2022. 2. Estimated fetal weight 1850 g, 15th percentile. 3. Biophysical profile score is 8 out of 8. Electronically Signed   By: Keane Police D.O.   On: 02/07/2022 23:59    Assessment:  Jasmine Gray is a 32 y.o. [id number] female at [redacted]w[redacted]d with fetal demise.   Plan:  Admit to Labor & Delivery CBC, T&S, Clrs, IVF Cytotec ordered  Consents obtained. Continuous efm/toco Anesthesia notified for epidural placement   Dr Amalia Hailey present, in agreement with assessment and plan.  ----- Roberto Scales, [place]

## 2022-03-02 NOTE — Progress Notes (Signed)
   Subjective:  Comfortable with epidural. Appropriately  tearful, family at her side.   Objective:   Vitals: Last menstrual period 06/22/2021. General: tearful but NAD Abdomen:non tender  Cervical Exam:  Dilation: 1 Effacement (%): 50 Cervical Position: Middle Station: -2 Presentation: Vertex Exam by:: L. Jadelynn Boylan, CNM   Toco: irritability   Results for orders placed or performed during the hospital encounter of 03/02/22 (from the past 24 hour(s))  Type and screen [place]     Status: None   Collection Time: 03/02/22  8:42 PM  Result Value Ref Range   ABO/RH(D) A POS    Antibody Screen NEG    Sample Expiration      03/05/2022,2359 Performed at [facility], [address] [place], [place] [address]   CBC     Status: Abnormal   Collection Time: 03/02/22  8:43 PM  Result Value Ref Range   WBC 15.2 (H) 4.0 - 10.5 K/uL   RBC 3.59 (L) 3.87 - 5.11 MIL/uL   Hemoglobin 11.4 (L) 12.0 - 15.0 g/dL   HCT 34.3 (L) 36.0 - 46.0 %   MCV 95.5 80.0 - 100.0 fL   MCH 31.8 26.0 - 34.0 pg   MCHC 33.2 30.0 - 36.0 g/dL   RDW 13.4 11.5 - 15.5 %   Platelets 160 150 - 400 K/uL   nRBC 0.0 0.0 - 0.2 %  Comprehensive metabolic panel     Status: Abnormal   Collection Time: 03/02/22  8:43 PM  Result Value Ref Range   Sodium 135 135 - 145 mmol/L   Potassium 3.9 3.5 - 5.1 mmol/L   Chloride 103 98 - 111 mmol/L   CO2 22 22 - 32 mmol/L   Glucose, Bld 99 70 - 99 mg/dL   BUN 11 6 - 20 mg/dL   Creatinine, Ser 0.80 0.44 - 1.00 mg/dL   Calcium 9.2 8.9 - 10.3 mg/dL   Total Protein 6.3 (L) 6.5 - 8.1 g/dL   Albumin 2.8 (L) 3.5 - 5.0 g/dL   AST 15 15 - 41 U/L   ALT 10 0 - 44 U/L   Alkaline Phosphatase 103 38 - 126 U/L   Total Bilirubin 0.3 0.3 - 1.2 mg/dL   GFR, Estimated >60 >60 mL/min   Anion gap 10 5 - 15    Assessment:   32 y.o. [id number] [redacted]w[redacted]d admitted for fetal demise   Plan:   1) Labor -Cytotec 54mcg placed per vagina and 50mg  oral   2) GHTN: BP  initially low after epidural, now improving   3) Pain management: epidural managed by [place]  03/02/2022 11:28 PM

## 2022-03-02 NOTE — OB Triage Note (Signed)
Ultrasound called for OB limited

## 2022-03-02 NOTE — Anesthesia Procedure Notes (Addendum)
Epidural Patient location during procedure: OB Start time: 03/02/2022 9:42 PM End time: 03/02/2022 10:15 PM  Staffing Anesthesiologist: Ilene Qua, MD Resident/CRNA: Jerrye Noble, CRNA Performed: anesthesiologist and resident/CRNA   Preanesthetic Checklist Completed: patient identified, IV checked, site marked, risks and benefits discussed, surgical consent, monitors and equipment checked, pre-op evaluation and timeout performed  Epidural Patient position: sitting Prep: ChloraPrep Patient monitoring: heart rate, continuous pulse ox and blood pressure Approach: midline Location: L3-L4 Injection technique: LOR saline  Needle:  Needle type: Tuohy  Needle gauge: 17 G Needle length: 9 cm and 9 Needle insertion depth: 7.5 cm Catheter type: closed end flexible Catheter size: 19 Gauge Catheter at skin depth: 12 cm Test dose: negative and 1.5% lidocaine with Epi 1:200 K  Assessment Sensory level: T10 Events: blood not aspirated, no cerebrospinal fluid (3), injection not painful, no injection resistance, no paresthesia and negative IV test  Additional Notes 3 attempt Pt. Evaluated and documentation done after procedure finished. Patient identified. Risks/Benefits/Options discussed with patient including but not limited to bleeding, infection, nerve damage, paralysis, failed block, incomplete pain control, headache, blood pressure changes, nausea, vomiting, reactions to medication both or allergic, itching and postpartum back pain. Confirmed with bedside nurse the patient's most recent platelet count. Confirmed with patient that they are not currently taking any anticoagulation, have any bleeding history or any family history of bleeding disorders. Patient expressed understanding and wished to proceed. All questions were answered. Sterile technique was used throughout the entire procedure. Please see nursing notes for vital signs. Test dose was given through epidural catheter and  negative prior to continuing to dose epidural or start infusion. Warning signs of high block given to the patient including shortness of breath, tingling/numbness in hands, complete motor block, or any concerning symptoms with instructions to call for help. Patient was given instructions on fall risk and not to get out of bed. All questions and concerns addressed with instructions to call with any issues or inadequate analgesia.    Patient tolerated the insertion well without immediate complications.Reason for block:procedure for pain

## 2022-03-03 ENCOUNTER — Encounter
Admission: EM | Disposition: A | Discharge status: Home / Self Care | Payer: Self-pay | Source: Home / Self Care | Attending: Licensed Practical Nurse

## 2022-03-03 ENCOUNTER — Encounter: Payer: Self-pay | Admitting: Obstetrics and Gynecology

## 2022-03-03 ENCOUNTER — Other Ambulatory Visit: Payer: Self-pay

## 2022-03-03 DIAGNOSIS — Z3A36 36 weeks gestation of pregnancy: Secondary | ICD-10-CM

## 2022-03-03 DIAGNOSIS — O364XX0 Maternal care for intrauterine death, not applicable or unspecified: Secondary | ICD-10-CM

## 2022-03-03 DIAGNOSIS — O3680X0 Pregnancy with inconclusive fetal viability, not applicable or unspecified: Secondary | ICD-10-CM

## 2022-03-03 DIAGNOSIS — O134 Gestational [pregnancy-induced] hypertension without significant proteinuria, complicating childbirth: Secondary | ICD-10-CM

## 2022-03-03 DIAGNOSIS — O4593 Premature separation of placenta, unspecified, third trimester: Secondary | ICD-10-CM

## 2022-03-03 DIAGNOSIS — Z3A00 Weeks of gestation of pregnancy not specified: Secondary | ICD-10-CM

## 2022-03-03 DIAGNOSIS — O9902 Anemia complicating childbirth: Secondary | ICD-10-CM

## 2022-03-03 DIAGNOSIS — D62 Acute posthemorrhagic anemia: Secondary | ICD-10-CM

## 2022-03-03 LAB — TECHNOLOGIST SMEAR REVIEW: Plt Morphology: NORMAL

## 2022-03-03 LAB — CBC WITH DIFFERENTIAL/PLATELET
Abs Immature Granulocytes: 0.05 10*3/uL (ref 0.00–0.07)
Abs Immature Granulocytes: 0.09 10*3/uL — ABNORMAL HIGH (ref 0.00–0.07)
Basophils Absolute: 0 10*3/uL (ref 0.0–0.1)
Basophils Absolute: 0 10*3/uL (ref 0.0–0.1)
Basophils Relative: 0 %
Basophils Relative: 0 %
Eosinophils Absolute: 0 10*3/uL (ref 0.0–0.5)
Eosinophils Absolute: 0 10*3/uL (ref 0.0–0.5)
Eosinophils Relative: 0 %
Eosinophils Relative: 0 %
HCT: 17 % — ABNORMAL LOW (ref 36.0–46.0)
HCT: 28.2 % — ABNORMAL LOW (ref 36.0–46.0)
Hemoglobin: 5.6 g/dL — ABNORMAL LOW (ref 12.0–15.0)
Hemoglobin: 9.4 g/dL — ABNORMAL LOW (ref 12.0–15.0)
Immature Granulocytes: 1 %
Immature Granulocytes: 1 %
Lymphocytes Relative: 15 %
Lymphocytes Relative: 9 %
Lymphs Abs: 1.4 10*3/uL (ref 0.7–4.0)
Lymphs Abs: 1.2 10*3/uL (ref 0.7–4.0)
MCH: 31.5 pg (ref 26.0–34.0)
MCH: 29.9 pg (ref 26.0–34.0)
MCHC: 32.9 g/dL (ref 30.0–36.0)
MCHC: 33.3 g/dL (ref 30.0–36.0)
MCV: 95.5 fL (ref 80.0–100.0)
MCV: 89.8 fL (ref 80.0–100.0)
Monocytes Absolute: 0.6 10*3/uL (ref 0.1–1.0)
Monocytes Absolute: 0.8 10*3/uL (ref 0.1–1.0)
Monocytes Relative: 7 %
Monocytes Relative: 6 %
Neutro Abs: 6.9 10*3/uL (ref 1.7–7.7)
Neutro Abs: 11.4 10*3/uL — ABNORMAL HIGH (ref 1.7–7.7)
Neutrophils Relative %: 77 %
Neutrophils Relative %: 84 %
Platelets: 204 10*3/uL (ref 150–400)
Platelets: 116 10*3/uL — ABNORMAL LOW (ref 150–400)
RBC: 1.78 MIL/uL — ABNORMAL LOW (ref 3.87–5.11)
RBC: 3.14 MIL/uL — ABNORMAL LOW (ref 3.87–5.11)
RDW: 14.6 % (ref 11.5–15.5)
RDW: 16.6 % — ABNORMAL HIGH (ref 11.5–15.5)
WBC: 8.9 10*3/uL (ref 4.0–10.5)
WBC: 13.6 10*3/uL — ABNORMAL HIGH (ref 4.0–10.5)
nRBC: 0 % (ref 0.0–0.2)
nRBC: 0 % (ref 0.0–0.2)

## 2022-03-03 LAB — CBC
HCT: 24.8 % — ABNORMAL LOW (ref 36.0–46.0)
HCT: 25.2 % — ABNORMAL LOW (ref 36.0–46.0)
HCT: 27.7 % — ABNORMAL LOW (ref 36.0–46.0)
Hemoglobin: 8.4 g/dL — ABNORMAL LOW (ref 12.0–15.0)
Hemoglobin: 8.8 g/dL — ABNORMAL LOW (ref 12.0–15.0)
Hemoglobin: 9.3 g/dL — ABNORMAL LOW (ref 12.0–15.0)
MCH: 31.2 pg (ref 26.0–34.0)
MCH: 31.8 pg (ref 26.0–34.0)
MCH: 32.1 pg (ref 26.0–34.0)
MCHC: 33.3 g/dL (ref 30.0–36.0)
MCHC: 33.6 g/dL (ref 30.0–36.0)
MCHC: 35.5 g/dL (ref 30.0–36.0)
MCV: 87.9 fL (ref 80.0–100.0)
MCV: 95.5 fL (ref 80.0–100.0)
MCV: 95.5 fL (ref 80.0–100.0)
Platelets: 115 10*3/uL — ABNORMAL LOW (ref 150–400)
Platelets: 117 10*3/uL — ABNORMAL LOW (ref 150–400)
Platelets: 138 10*3/uL — ABNORMAL LOW (ref 150–400)
RBC: 2.64 MIL/uL — ABNORMAL LOW (ref 3.87–5.11)
RBC: 2.82 MIL/uL — ABNORMAL LOW (ref 3.87–5.11)
RBC: 2.9 MIL/uL — ABNORMAL LOW (ref 3.87–5.11)
RDW: 13.8 % (ref 11.5–15.5)
RDW: 14.2 % (ref 11.5–15.5)
RDW: 17.2 % — ABNORMAL HIGH (ref 11.5–15.5)
WBC: 12 10*3/uL — ABNORMAL HIGH (ref 4.0–10.5)
WBC: 13.6 10*3/uL — ABNORMAL HIGH (ref 4.0–10.5)
WBC: 14.9 10*3/uL — ABNORMAL HIGH (ref 4.0–10.5)
nRBC: 0 % (ref 0.0–0.2)
nRBC: 0 % (ref 0.0–0.2)
nRBC: 0 % (ref 0.0–0.2)

## 2022-03-03 LAB — HEPATIC FUNCTION PANEL
ALT: 12 U/L (ref 0–44)
AST: 23 U/L (ref 15–41)
Albumin: 2.7 g/dL — ABNORMAL LOW (ref 3.5–5.0)
Alkaline Phosphatase: 104 U/L (ref 38–126)
Bilirubin, Direct: <0.1 mg/dL (ref 0.0–0.2)
Total Bilirubin: 0.4 mg/dL (ref 0.3–1.2)
Total Protein: 5.8 g/dL — ABNORMAL LOW (ref 6.5–8.1)

## 2022-03-03 LAB — RPR: RPR Ser Ql: NONREACTIVE

## 2022-03-03 LAB — PREPARE RBC (CROSSMATCH)

## 2022-03-03 LAB — FIBRINOGEN
Fibrinogen: 153 mg/dL — ABNORMAL LOW (ref 210–475)
Fibrinogen: 175 mg/dL — ABNORMAL LOW (ref 210–475)
Fibrinogen: 200 mg/dL — ABNORMAL LOW (ref 210–475)
Fibrinogen: 260 mg/dL (ref 210–475)

## 2022-03-03 LAB — PROTIME-INR
INR: 1.2 (ref 0.8–1.2)
INR: 1.1 (ref 0.8–1.2)
INR: 1.2 (ref 0.8–1.2)
INR: 1.1 (ref 0.8–1.2)
Prothrombin Time: 15.2 seconds (ref 11.4–15.2)
Prothrombin Time: 14.6 seconds (ref 11.4–15.2)
Prothrombin Time: 15.3 seconds — ABNORMAL HIGH (ref 11.4–15.2)
Prothrombin Time: 14.2 seconds (ref 11.4–15.2)

## 2022-03-03 LAB — MASSIVE TRANSFUSION PROTOCOL ORDER (BLOOD BANK NOTIFICATION)

## 2022-03-03 LAB — APTT: aPTT: 28 seconds (ref 24–36)

## 2022-03-03 LAB — HIV ANTIBODY (ROUTINE TESTING W REFLEX): HIV Screen 4th Generation wRfx: NONREACTIVE

## 2022-03-03 SURGERY — Surgical Case
Anesthesia: General

## 2022-03-03 MED ORDER — SODIUM CHLORIDE 0.9 % IV SOLN
INTRAVENOUS | Status: DC | PRN
  Administered 2022-03-03: 500 mg via INTRAVENOUS

## 2022-03-03 MED ORDER — PROPOFOL 10 MG/ML IV BOLUS
INTRAVENOUS | Status: AC
  Filled 2022-03-03: qty 20

## 2022-03-03 MED ORDER — ROCURONIUM BROMIDE 10 MG/ML (PF) SYRINGE
PREFILLED_SYRINGE | INTRAVENOUS | Status: AC
  Filled 2022-03-03: qty 10

## 2022-03-03 MED ORDER — PHENYLEPHRINE 80 MCG/ML (10ML) SYRINGE FOR IV PUSH (FOR BLOOD PRESSURE SUPPORT)
PREFILLED_SYRINGE | INTRAVENOUS | Status: AC
  Filled 2022-03-03: qty 10

## 2022-03-03 MED ORDER — HEPARIN 30,000 UNITS/1000 ML (OHS) CELLSAVER SOLUTION
Status: AC
  Filled 2022-03-03: qty 1000

## 2022-03-03 MED ORDER — SODIUM CHLORIDE 0.9% IV SOLUTION: Freq: Once | INTRAVENOUS | Status: DC

## 2022-03-03 MED ORDER — SUCCINYLCHOLINE CHLORIDE 200 MG/10ML IV SOSY
PREFILLED_SYRINGE | INTRAVENOUS | Status: DC | PRN
  Administered 2022-03-03: 180 mg via INTRAVENOUS

## 2022-03-03 MED ORDER — ROCURONIUM BROMIDE 100 MG/10ML IV SOLN
INTRAVENOUS | Status: DC | PRN
  Administered 2022-03-03: 25 mg via INTRAVENOUS

## 2022-03-03 MED ORDER — OXYTOCIN-SODIUM CHLORIDE 30-0.9 UT/500ML-% IV SOLN: 2.5000 [IU]/h | INTRAVENOUS | Status: AC

## 2022-03-03 MED ORDER — SODIUM CHLORIDE 0.9 % IV SOLN
INTRAVENOUS | Status: AC
  Filled 2022-03-03: qty 5

## 2022-03-03 MED ORDER — CALCIUM GLUCONATE 10 % IV SOLN
INTRAVENOUS | Status: DC | PRN
  Administered 2022-03-03: 1 g via INTRAVENOUS

## 2022-03-03 MED ORDER — OXYCODONE HCL 5 MG PO TABS: 5.0000 mg | ORAL_TABLET | Freq: Once | ORAL | Status: DC | PRN

## 2022-03-03 MED ORDER — ZOLPIDEM TARTRATE 5 MG PO TABS: 5.0000 mg | ORAL_TABLET | Freq: Every evening | ORAL | Status: DC | PRN

## 2022-03-03 MED ORDER — MENTHOL 3 MG MT LOZG: 1.0000 | LOZENGE | OROMUCOSAL | Status: DC | PRN

## 2022-03-03 MED ORDER — LIDOCAINE HCL (CARDIAC) PF 100 MG/5ML IV SOSY
PREFILLED_SYRINGE | INTRAVENOUS | Status: DC | PRN
  Administered 2022-03-03: 100 mg via INTRAVENOUS

## 2022-03-03 MED ORDER — ACETAMINOPHEN 10 MG/ML IV SOLN
1000.0000 mg | Freq: Four times a day (QID) | INTRAVENOUS | Status: AC
  Administered 2022-03-03 – 2022-03-04 (×2): 1000 mg via INTRAVENOUS
  Filled 2022-03-03 (×2): qty 100

## 2022-03-03 MED ORDER — SODIUM CHLORIDE 0.9% IV SOLUTION: Freq: Once | INTRAVENOUS | Status: AC

## 2022-03-03 MED ORDER — FENTANYL CITRATE (PF) 100 MCG/2ML IJ SOLN
25.0000 ug | INTRAMUSCULAR | Status: DC | PRN
  Administered 2022-03-03: 50 ug via INTRAVENOUS
  Filled 2022-03-03: qty 2

## 2022-03-03 MED ORDER — FENTANYL CITRATE (PF) 100 MCG/2ML IJ SOLN
INTRAMUSCULAR | Status: AC
  Filled 2022-03-03: qty 2

## 2022-03-03 MED ORDER — OXYTOCIN 10 UNIT/ML IJ SOLN
INTRAMUSCULAR | Status: AC
  Filled 2022-03-03: qty 2

## 2022-03-03 MED ORDER — LIDOCAINE 5 % EX PTCH
MEDICATED_PATCH | CUTANEOUS | Status: DC | PRN
  Administered 2022-03-03: 1 via TRANSDERMAL

## 2022-03-03 MED ORDER — FENTANYL CITRATE (PF) 100 MCG/2ML IJ SOLN: 100.0000 ug | INTRAMUSCULAR | Status: DC | PRN

## 2022-03-03 MED ORDER — TRANEXAMIC ACID-NACL 1000-0.7 MG/100ML-% IV SOLN
INTRAVENOUS | Status: DC | PRN
  Administered 2022-03-03: 1000 mg via INTRAVENOUS

## 2022-03-03 MED ORDER — PROPOFOL 10 MG/ML IV BOLUS
INTRAVENOUS | Status: DC | PRN
  Administered 2022-03-03: 150 mg via INTRAVENOUS

## 2022-03-03 MED ORDER — PRENATAL MULTIVITAMIN CH
1.0000 | ORAL_TABLET | Freq: Every day | ORAL | Status: DC
  Administered 2022-03-04 – 2022-03-05 (×2): 1 via ORAL
  Filled 2022-03-03 (×2): qty 1

## 2022-03-03 MED ORDER — MIDAZOLAM HCL 2 MG/2ML IJ SOLN
INTRAMUSCULAR | Status: DC | PRN
  Administered 2022-03-03: 2 mg via INTRAVENOUS

## 2022-03-03 MED ORDER — CEFAZOLIN SODIUM-DEXTROSE 2-3 GM-%(50ML) IV SOLR
INTRAVENOUS | Status: DC | PRN
  Administered 2022-03-03: 2 g via INTRAVENOUS

## 2022-03-03 MED ORDER — SUGAMMADEX SODIUM 200 MG/2ML IV SOLN
INTRAVENOUS | Status: DC | PRN
  Administered 2022-03-03: 200 mg via INTRAVENOUS

## 2022-03-03 MED ORDER — OXYTOCIN-SODIUM CHLORIDE 30-0.9 UT/500ML-% IV SOLN
INTRAVENOUS | Status: AC
  Filled 2022-03-03: qty 500

## 2022-03-03 MED ORDER — TRANEXAMIC ACID-NACL 1000-0.7 MG/100ML-% IV SOLN
INTRAVENOUS | Status: AC
  Filled 2022-03-03: qty 100

## 2022-03-03 MED ORDER — PHENYLEPHRINE HCL-NACL 20-0.9 MG/250ML-% IV SOLN
INTRAVENOUS | Status: DC | PRN
  Administered 2022-03-03: 40 ug/min via INTRAVENOUS

## 2022-03-03 MED ORDER — SENNOSIDES-DOCUSATE SODIUM 8.6-50 MG PO TABS
2.0000 | ORAL_TABLET | ORAL | Status: DC
  Administered 2022-03-04 (×2): 2 via ORAL
  Filled 2022-03-03 (×2): qty 2

## 2022-03-03 MED ORDER — SIMETHICONE 80 MG PO CHEW
80.0000 mg | CHEWABLE_TABLET | Freq: Four times a day (QID) | ORAL | Status: DC
  Administered 2022-03-04 – 2022-03-05 (×6): 80 mg via ORAL
  Filled 2022-03-03 (×6): qty 1

## 2022-03-03 MED ORDER — AMMONIA AROMATIC IN INHA
RESPIRATORY_TRACT | Status: AC
  Filled 2022-03-03: qty 10

## 2022-03-03 MED ORDER — DIPHENHYDRAMINE HCL 25 MG PO CAPS: 25.0000 mg | ORAL_CAPSULE | Freq: Four times a day (QID) | ORAL | Status: DC | PRN

## 2022-03-03 MED ORDER — LIDOCAINE HCL (PF) 2 % IJ SOLN
INTRAMUSCULAR | Status: AC
  Filled 2022-03-03: qty 5

## 2022-03-03 MED ORDER — SUCCINYLCHOLINE CHLORIDE 200 MG/10ML IV SOSY
PREFILLED_SYRINGE | INTRAVENOUS | Status: AC
  Filled 2022-03-03: qty 10

## 2022-03-03 MED ORDER — EPHEDRINE 5 MG/ML INJ
INTRAVENOUS | Status: AC
  Filled 2022-03-03: qty 5

## 2022-03-03 MED ORDER — COCONUT OIL OIL
TOPICAL_OIL | Status: AC
  Filled 2022-03-03: qty 7.5

## 2022-03-03 MED ORDER — MISOPROSTOL 200 MCG PO TABS
ORAL_TABLET | ORAL | Status: AC
  Filled 2022-03-03: qty 4

## 2022-03-03 MED ORDER — GLYCOPYRROLATE 0.2 MG/ML IJ SOLN
INTRAMUSCULAR | Status: AC
  Filled 2022-03-03: qty 2

## 2022-03-03 MED ORDER — METHYLERGONOVINE MALEATE 0.2 MG/ML IJ SOLN
INTRAMUSCULAR | Status: AC
  Filled 2022-03-03: qty 1

## 2022-03-03 MED ORDER — SOD CITRATE-CITRIC ACID 500-334 MG/5ML PO SOLN
ORAL | Status: AC
  Filled 2022-03-03: qty 15

## 2022-03-03 MED ORDER — CARBOPROST TROMETHAMINE 250 MCG/ML IM SOLN
INTRAMUSCULAR | Status: AC
  Filled 2022-03-03: qty 1

## 2022-03-03 MED ORDER — LIDOCAINE HCL (PF) 1 % IJ SOLN
INTRAMUSCULAR | Status: AC
  Filled 2022-03-03: qty 30

## 2022-03-03 MED ORDER — FAMOTIDINE 20 MG PO TABS
20.0000 mg | ORAL_TABLET | Freq: Every day | ORAL | Status: DC
  Administered 2022-03-03 – 2022-03-05 (×3): 20 mg via ORAL
  Filled 2022-03-03 (×3): qty 1

## 2022-03-03 MED ORDER — OXYCODONE HCL 5 MG PO TABS
5.0000 mg | ORAL_TABLET | ORAL | Status: DC | PRN
  Administered 2022-03-03 – 2022-03-05 (×8): 10 mg via ORAL
  Filled 2022-03-03 (×8): qty 2

## 2022-03-03 MED ORDER — MIDAZOLAM HCL 2 MG/2ML IJ SOLN
INTRAMUSCULAR | Status: AC
  Filled 2022-03-03: qty 2

## 2022-03-03 MED ORDER — LACTATED RINGERS IV SOLN: INTRAVENOUS | Status: DC

## 2022-03-03 MED ORDER — OXYCODONE HCL 5 MG/5ML PO SOLN: 5.0000 mg | Freq: Once | ORAL | Status: DC | PRN

## 2022-03-03 MED ORDER — FENTANYL CITRATE (PF) 100 MCG/2ML IJ SOLN
INTRAMUSCULAR | Status: DC | PRN
  Administered 2022-03-03 (×4): 50 ug via INTRAVENOUS

## 2022-03-03 SURGICAL SUPPLY — 30 items
ADHESIVE MASTISOL STRL (MISCELLANEOUS) ×1 IMPLANT
BAG COUNTER SPONGE SURGICOUNT (BAG) ×1 IMPLANT
CELL SAVER FILTER REG 2-05 PAL (MISCELLANEOUS) ×1
CHLORAPREP W/TINT 26 (MISCELLANEOUS) ×2 IMPLANT
CLOSURE STERI STRIP 1/2 X4 (GAUZE/BANDAGES/DRESSINGS) IMPLANT
DRSG TELFA 3X8 NADH STRL (GAUZE/BANDAGES/DRESSINGS) ×1 IMPLANT
FILTER REG 2-05 PAL CELL SAVER (MISCELLANEOUS) IMPLANT
GAUZE SPONGE 4X4 12PLY STRL (GAUZE/BANDAGES/DRESSINGS) ×1 IMPLANT
GLOVE PI ORTHO PRO STRL 7.5 (GLOVE) ×1 IMPLANT
GOWN STRL REUS W/ TWL LRG LVL3 (GOWN DISPOSABLE) ×2 IMPLANT
GOWN STRL REUS W/TWL LRG LVL3 (GOWN DISPOSABLE) ×2
KIT TURNOVER KIT A (KITS) ×1 IMPLANT
MANIFOLD NEPTUNE II (INSTRUMENTS) ×1 IMPLANT
MAT PREVALON FULL STRYKER (MISCELLANEOUS) ×1 IMPLANT
NS IRRIG 1000ML POUR BTL (IV SOLUTION) ×1 IMPLANT
PACK C SECTION AR (MISCELLANEOUS) ×1 IMPLANT
PAD ABD DERMACEA PRESS 5X9 (GAUZE/BANDAGES/DRESSINGS) IMPLANT
PAD OB MATERNITY 4.3X12.25 (PERSONAL CARE ITEMS) ×1 IMPLANT
PAD PREP 24X41 OB/GYN DISP (PERSONAL CARE ITEMS) ×1 IMPLANT
RETRACTOR WND ALEXIS-O 25 LRG (MISCELLANEOUS) ×1 IMPLANT
RTRCTR WOUND ALEXIS O 25CM LRG (MISCELLANEOUS) ×1
SCRUB CHG 4% DYNA-HEX 4OZ (MISCELLANEOUS) ×1 IMPLANT
SPONGE GAUZE 4X4 12PLY (GAUZE/BANDAGES/DRESSINGS) IMPLANT
SPONGE T-LAP 18X18 ~~LOC~~+RFID (SPONGE) ×1 IMPLANT
SUT VIC AB 0 CTX 36 (SUTURE) ×3
SUT VIC AB 0 CTX36XBRD ANBCTRL (SUTURE) ×2 IMPLANT
SUT VIC AB 1 CT1 36 (SUTURE) ×2 IMPLANT
SUT VICRYL+ 3-0 36IN CT-1 (SUTURE) ×2 IMPLANT
TRAP FLUID SMOKE EVACUATOR (MISCELLANEOUS) ×1 IMPLANT
WATER STERILE IRR 500ML POUR (IV SOLUTION) ×1 IMPLANT

## 2022-03-03 NOTE — Anesthesia Procedure Notes (Signed)
Procedure Name: Intubation Date/Time: 03/03/2022 5:28 PM  Performed by: Garner Nash, CRNAPre-anesthesia Checklist: Patient identified, Emergency Drugs available, Suction available and Patient being monitored Patient Re-evaluated:Patient Re-evaluated prior to induction Oxygen Delivery Method: Circle system utilized Preoxygenation: Pre-oxygenation with 100% oxygen Induction Type: IV induction Laryngoscope Size: McGraph and 3 Grade View: Grade II Tube type: Oral Tube size: 6.5 mm Number of attempts: 1 Airway Equipment and Method: Stylet Placement Confirmation: ETT inserted through vocal cords under direct vision, positive ETCO2 and breath sounds checked- equal and bilateral Secured at: 20 cm Tube secured with: Tape Dental Injury: Teeth and Oropharynx as per pre-operative assessment

## 2022-03-03 NOTE — Progress Notes (Signed)
   Subjective:  Resting quietly. Feels a little pain on her lower right side, does improve with the use of the pcea button   Objective:   Vitals: Blood pressure 128/61, pulse 88, temperature 98.4 F (36.9 C), resp. rate 20, height 5\' 7"  (1.702 m), weight 106.5 kg, last menstrual period 06/22/2021, SpO2 100 %. General: NAD Abdomen:non tender  Cervical Exam:  Dilation: 1 Effacement (%): 50 Cervical Position: Middle Station: -2 Presentation: Vertex Exam by:: L. Chizaram Latino, CNM bloody show    Toco:irritability   Results for orders placed or performed during the hospital encounter of 03/02/22 (from the past 24 hour(s))  Type and screen [place]     Status: None   Collection Time: 03/02/22  8:42 PM  Result Value Ref Range   ABO/RH(D) A POS    Antibody Screen NEG    Sample Expiration      03/05/2022,2359 Performed at [facility], [address] [place], [place] [address]   CBC     Status: Abnormal   Collection Time: 03/02/22  8:43 PM  Result Value Ref Range   WBC 15.2 (H) 4.0 - 10.5 K/uL   RBC 3.59 (L) 3.87 - 5.11 MIL/uL   Hemoglobin 11.4 (L) 12.0 - 15.0 g/dL   HCT 34.3 (L) 36.0 - 46.0 %   MCV 95.5 80.0 - 100.0 fL   MCH 31.8 26.0 - 34.0 pg   MCHC 33.2 30.0 - 36.0 g/dL   RDW 13.4 11.5 - 15.5 %   Platelets 160 150 - 400 K/uL   nRBC 0.0 0.0 - 0.2 %  Comprehensive metabolic panel     Status: Abnormal   Collection Time: 03/02/22  8:43 PM  Result Value Ref Range   Sodium 135 135 - 145 mmol/L   Potassium 3.9 3.5 - 5.1 mmol/L   Chloride 103 98 - 111 mmol/L   CO2 22 22 - 32 mmol/L   Glucose, Bld 99 70 - 99 mg/dL   BUN 11 6 - 20 mg/dL   Creatinine, Ser 0.80 0.44 - 1.00 mg/dL   Calcium 9.2 8.9 - 10.3 mg/dL   Total Protein 6.3 (L) 6.5 - 8.1 g/dL   Albumin 2.8 (L) 3.5 - 5.0 g/dL   AST 15 15 - 41 U/L   ALT 10 0 - 44 U/L   Alkaline Phosphatase 103 38 - 126 U/L   Total Bilirubin 0.3 0.3 - 1.2 mg/dL   GFR, Estimated >60 >60 mL/min   Anion gap 10  5 - 15  HIV Antibody (routine testing w rflx)     Status: None   Collection Time: 03/02/22  8:43 PM  Result Value Ref Range   HIV Screen 4th Generation wRfx Non Reactive Non Reactive    Assessment:   32 y.o. H2D9242 [redacted]w[redacted]d admitted with a fetal demise   Plan:   1) Labor -2nd does of Cytotec,  82mcg placed vaginally   2) GHTN: BP's WNL    3) Pain management: epidural managed by Anesthesia    Roberto Scales, [employer]  03/03/2022 3:39 AM

## 2022-03-03 NOTE — Consult Note (Addendum)
I re-rounded on the patient and discussed labs/findings with nursing and midwifery teams.   I reviewed the labs and there seems to be an appropriate bump in hemoglobin after initial round of product transfusion. Fibrinogen is higher. Per nursing, her cervix is now dilated to 6cm without signs of further bleeding or hemorrhage. Plan remains to continue to second round of blood products transfusion and checking labs every four hours. Upon lab review, I still do not see signs of DIC and/or irreversible coagulopathy. I anticipate platelets will also rise. She remains otherwise hemodynamically stable and without signs of overt gross bleeding/uncontrolled hemorrhage, I feel a platelet goal of >50k should still be fine.   Do not hesitate to call/text with any concerns.   Arcelia Jew MD [facility]

## 2022-03-03 NOTE — Consult Note (Signed)
Chaplain visited with family in patient room. Pt already in operating room. Met with famil in room, offered my condolences, compassionate presence and listening ear. Offered a blessing over the pt and her baby, and offered a prayer over the family. Please contact further for follow  up care.

## 2022-03-03 NOTE — Op Note (Addendum)
     OP NOTE  Date: 03/03/2022   7:35 PM Name Jasmine Gray MR# [id number]  Preoperative Diagnosis: 1. Intrauterine pregnancy at 43w2dPrincipal Problem:   Encounter for assessment for fetal demise  2.  Worsening blood loss 3.  Fetal malpresentation - remote from delivery 4   Suspected placental abruption  Postoperative Diagnosis: 1. Intrauterine pregnancy at 359w2ddelivered 2. IUFD 3. Remainder same as pre-op   Procedure: 1. Primary Low-Transverse Cesarean Section  Surgeon: DaFinis BudMD  Assistant:  CoLenoard Aden No other capable assistant was available for this surgery which requires an experienced, high level assistant.   Anesthesia: General    EBL: 2600  ml     Findings: 1) female infant, Apgar scores of 0   at 1 minute and 0   at 5 minutes and a birthweight of   ounces.    2) [place] uterus, tight double nuchal cord with neck skin sloughing, placental abruption with large intrauterine clots, malpresentation of fetal vertex.    Procedure:  The patient was prepped and draped in the supine position and placed under spinal anesthesia.  A transverse incision was made across the abdomen in a Pfannenstiel manner. If indicated the old scar was systematically removed with sharp dissection.  We carried the dissection down to the level of the fascia.  The fascia was incised in a curvilinear manner.  The fascia was then elevated from the rectus muscles with blunt and sharp dissection.  The rectus muscles were separated laterally exposing the peritoneum.  The peritoneum was carefully entered with care being taken to avoid bowel and bladder.  A self-retaining retractor was placed.  The visceral peritoneum was incised in a curvilinear fashion across the lower uterine segment creating a bladder flap. A transverse incision was made across the lower uterine segment and extended laterally and superiorly using the bandage scissors.  The infant was delivered from the cephalic position.  A  double nuchal cord was present. The placenta was delivered and found to be completely detached from the uterine wall.  The hysterotomy incision was then identified on ring forceps.  The uterine cavity was cleaned with a moist lap sponge.  The hysterotomy incision was closed with a running interlocking suture of Vicryl.  Hemostasis was excellent.  Pitocin was run in the IV and the uterus was found to be firm. The posterior cul-de-sac and gutters were cleaned and inspected.  Hemostasis was noted.  The fascia was then closed with a running suture of #1 Vicryl.  Hemostasis of the subcutaneous tissues was obtained using the Bovie.  The subcutaneous tissues were closed with a running suture of 000 Vicryl.  A subcuticular suture was placed.  Steri-strips were applied in the usual manner.  A Lidoderm patch was applied.  A pressure dressing was placed.  The patient went to the recovery room in stable condition. Cowen CNM provided exposure, dissection, suctioning, retraction, and general support and assistance during the procedure.   DaFinis BudM.D. 03/03/2022 7:35 PM

## 2022-03-03 NOTE — Progress Notes (Signed)
   Subjective:  Comfortable with epidural.  Attempted to sit up in bed but felt light headed once up right. Once reclined pt improved, IV bolus given.   Objective:   Vitals: Blood pressure 127/69, pulse (!) 132, temperature 98.5 F (36.9 C), temperature source Oral, resp. rate 18, height 5\' 7"  (1.702 m), weight 106.5 kg, last menstrual period 06/22/2021, SpO2 100 %. General: NAD Abdomen:non tender Cervical Exam:  Dilation: 2 Effacement (%): 50 Cervical Position: Middle Station: -2 Presentation: Vertex Exam by:: Amadea Keagy CNM   Toco:irritability  RN reports no resting tone   Results for orders placed or performed during the hospital encounter of 03/02/22 (from the past 24 hour(s))  Type and screen [place]     Status: None   Collection Time: 03/02/22  8:42 PM  Result Value Ref Range   ABO/RH(D) A POS    Antibody Screen NEG    Sample Expiration      03/05/2022,2359 Performed at [facility], [address] [place], [place] [address]   CBC     Status: Abnormal   Collection Time: 03/02/22  8:43 PM  Result Value Ref Range   WBC 15.2 (H) 4.0 - 10.5 K/uL   RBC 3.59 (L) 3.87 - 5.11 MIL/uL   Hemoglobin 11.4 (L) 12.0 - 15.0 g/dL   HCT 34.3 (L) 36.0 - 46.0 %   MCV 95.5 80.0 - 100.0 fL   MCH 31.8 26.0 - 34.0 pg   MCHC 33.2 30.0 - 36.0 g/dL   RDW 13.4 11.5 - 15.5 %   Platelets 160 150 - 400 K/uL   nRBC 0.0 0.0 - 0.2 %  Comprehensive metabolic panel     Status: Abnormal   Collection Time: 03/02/22  8:43 PM  Result Value Ref Range   Sodium 135 135 - 145 mmol/L   Potassium 3.9 3.5 - 5.1 mmol/L   Chloride 103 98 - 111 mmol/L   CO2 22 22 - 32 mmol/L   Glucose, Bld 99 70 - 99 mg/dL   BUN 11 6 - 20 mg/dL   Creatinine, Ser 0.80 0.44 - 1.00 mg/dL   Calcium 9.2 8.9 - 10.3 mg/dL   Total Protein 6.3 (L) 6.5 - 8.1 g/dL   Albumin 2.8 (L) 3.5 - 5.0 g/dL   AST 15 15 - 41 U/L   ALT 10 0 - 44 U/L   Alkaline Phosphatase 103 38 - 126 U/L   Total Bilirubin  0.3 0.3 - 1.2 mg/dL   GFR, Estimated >60 >60 mL/min   Anion gap 10 5 - 15  HIV Antibody (routine testing w rflx)     Status: None   Collection Time: 03/02/22  8:43 PM  Result Value Ref Range   HIV Screen 4th Generation wRfx Non Reactive Non Reactive    Assessment:   32 y.o. O5D6644 [redacted]w[redacted]d admitted for fetal demise  Plan:   Pt with bloody show, irritability on TOCO with no resting tone, BP continues to be lower than pt's normal with heart rate in the 100's.  Dr Amalia Hailey called. Will hold next dose of Cytotec.  Dr Amalia Hailey to come in to assess pt. Anticipate AROM and IUPC.  CBC ordered  Roberto Scales, [facility]  03/03/2022 7:54 AM

## 2022-03-03 NOTE — Progress Notes (Signed)
LABOR NOTE   Jasmine Gray 32 y.o.GP@ at [redacted]w[redacted]d  SUBJECTIVE:  Patient feels well.  Is beginning to feel some pelvic pressure.  Analgesia: Epidural  OBJECTIVE:  BP 127/72   Pulse 91   Temp 98.9 F (37.2 C) (Oral)   Resp 16   Ht 5\' 7"  (1.702 m)   Wt 106.5 kg   LMP 06/22/2021 (Exact Date)   SpO2 100%   BMI 36.77 kg/m  Total I/O In: 710.7 [Blood:710.7] Out: 1200 [Urine:1200]  She has shown cervical change. CERVIX: 5cm :  75%:   -3:   :    SVE:   Dilation: 5 Effacement (%): 50 Station: -2 Exam by:: Sharnise Blough MD  Face presentation.  Mentum transverse slightly anterior.  (Unable to rotate manually because the head is still difficult to reach.) CONTRACTIONS: Q1-2 min 15-30 intensity     Labs: Lab Results  Component Value Date   WBC 14.9 (H) 03/03/2022   HGB 9.3 (L) 03/03/2022   HCT 27.7 (L) 03/03/2022   MCV 95.5 03/03/2022   PLT 115 (L) 03/03/2022    ASSESSMENT/Plan 1) Abruption stable.  Labwork stable.   2)  First round of blood products mostly completed 3)  Significant cervical change 4)  Malpresentation Face   Will again try to rotate to anterior  Discussion with MFM - "manage as you would with this same presentation with a living baby, including CD if necessary while mom is still stable."  Discussed presentation with mom   Finis Bud, M.D. 03/03/2022 3:42 PM

## 2022-03-03 NOTE — Progress Notes (Signed)
  Labor Progress Note   32 y.o. [id number] @ [redacted]w[redacted]d, IUFD. Receiving pitocin. RN just performed SVE and was unsure of exam, requested repeat exam by me. Patient has ongoing abruption. Has now received one unit packed red blood cells, and FFP.. Currently has platelets infusing which will be followed by cryo. Blood products have been managed by Dr. Gwendalyn Ege from ICU. Fibrinogen has been low but other clotting factors WNL.  Subjective:  Resting, no feeling pain, teary eyed  Objective:  BP 127/72   Pulse 91   Temp 98.9 F (37.2 C) (Oral)   Resp 16   Ht 5\' 7"  (1.702 m)   Wt 106.5 kg   LMP 06/22/2021 (Exact Date)   SpO2 100%   BMI 36.77 kg/m  Abd: gravid SVE: 6cm/60%/-3, suspect face presentation with left mentum anterior Performed bedside u/s which confirmed vertex but unable to ascertain exact presentation  Toco: Ucs q1-65min, Pit at 28, MVUs 160   Assessment  [id number] @ [redacted]w[redacted]d Active labor VSS IUFD Face presentation   Plan:   1. Consulted with Dr. Amalia Hailey who will come shortly to assess patient, has made cervical dilation since last SVE 2. For now will continue with pitocin for IOL, continue to closely monitor bleeding and vital signs  All discussed with patient  [place], [facility] 03/03/2022  3:09 PM

## 2022-03-03 NOTE — Consult Note (Signed)
NAME:  Jasmine Gray, MRN:  [id number], DOB:  1990-05-03, LOS: 1 ADMISSION DATE:  03/02/2022, CONSULTATION DATE:  03/03/2022 REFERRING MD:  Dr. Amalia Hailey, CHIEF COMPLAINT:  IUFD concerning for DIC  Brief Pt Description / Synopsis:   Acute anemia and drop in fibrinogen    History of Present Illness:  Per primary HPI: Jasmine Gray is a 32 y.o. Q7Y1950 female at [redacted]w[redacted]d dated by 8wk [place].  She presents to L&D for contraction/abdominal pain, vomiting and feeling dizzy.  This evening she started to feel contraction pain, she got in the shower to see if it would improve but the pain got worse, once in the ED she felt dizzy like she was going to faint and she vomited.  She was brought up to triage immediately, on arrival she reported being a lot of pain and "not feeling well". Reports feeling FM today on the way to the ED and on her way up to triage. Denies any LOF/VB.    RN called out as she was unable to auscultate fetal heartrate. BSUS brought to room, fetal heart visualized, movement not noted, absence of  fetal heart rate confirmed by hospital [place] technician.    Jasmine Gray's partner and family members present    Pregnancy Issues: 1. Fetal demise 2. GHTN 3. BMI 36.65  Interim History / Subjective:   Called to bedside by Dr. Amalia Hailey for possible DIC-type picture.   Patient is normotensive but appears tired and concerned. She denies any fevers or chills.    Objective   Blood pressure 129/69, pulse 86, temperature 98.2 F (36.8 C), temperature source Oral, resp. rate 18, height 5\' 7"  (1.702 m), weight 106.5 kg, last menstrual period 06/22/2021, SpO2 99 %.        Intake/Output Summary (Last 24 hours) at 03/03/2022 1119 Last data filed at 03/03/2022 0710 Gross per 24 hour  Intake --  Output 700 ml  Net -700 ml   Filed Weights   03/02/22 2028  Weight: 106.5 kg    Examination: General: tired appearing female HENT: normocephalic Lungs: CTAB Cardiovascular: RRR s1/s2 Abdomen: Distended abdomen and  uterus Extremities: moving spontaneouslyu Neuro: CNII-XII grossly intact GU: deferred  Resolved Hospital Problem list    Patient Active Problem List   Diagnosis Date Noted   Encounter for assessment for fetal demise 03/02/2022   [redacted] weeks gestation of pregnancy 02/28/2022   Labor and delivery, indication for care 02/28/2022   Indication for care in labor and delivery, antepartum 02/21/2022   [redacted] weeks gestation of pregnancy 02/20/2022   [redacted] weeks gestation of pregnancy 02/12/2022   Supervision of high risk pregnancy in second trimester 02/06/2022   Gestational hypertension, third trimester 02/06/2022   [redacted] weeks gestation of pregnancy 02/06/2022   Gestational hypertension without significant proteinuria, antepartum 01/16/2022   Trichomonal infection 10/01/2021   Subchorionic hemorrhage 09/30/2021   Supervision of high risk pregnancy, antepartum 08/23/2021   UTI (urinary tract infection) 04/28/2021   History of premature delivery 04/29/2016   Overweight (BMI 25.0-29.9) 07/28/2015   Tobacco user 05/31/2014     Assessment & Plan:  #IUFD #Placental abruption #Acute anemia #Acute hemorrhage #Drop in fibrinogen #Hypotension Called to bedside in an unfortunate situation of a very nice patient with suspected IUFD. Per OB, the drop in fibrinogen and bloody products are concerning for placental abruption. Her CBC would suggest acute hemorrhage of some sort as well. OB is highly concerned that she is in DIC. I do not have a smear yet to suggest that  this is DIC. But in a hypotensive patient with acute hemorrhage, I would recommend resuscitation in setting of possible delivery/surgery. Based on trauma literature, in absence of whole blood, would recommend balanced product transfusion much like you would do for post-operative hemorrhage control and bleeding. She is pleasant and oriented. She has no CNS manifestations. Her BP is a little soft, so I agree with fluids. I do not think she warrants  vasoactive med/pressor support.   - trend CBC, fibrinogen, and PT/INR now and every 4 hours - continue gentle fluid resuscitation - consider balanced resuscitation 1:1:1 (I have ordered 2u prbc, 2u platelets, 2u FFP) - have ordered 2u of each on standby - I have ordered 1 pool of cryo; can transfuse after first round of products is complete - please call me [phone]) or ICU for any transfusion-related issues/allergic reactions - do not recommend [place] at this time - does not meet criteria for MTP - consider 2 large bore PIVs or CVC. She needs 2 18 gauge PIVs. - defer decision on delivery vs surgery to Dr. Amalia Hailey and MFM team - does not meet indication to transfer to ICU at this time - we will follow along closely  Best Practice (right click and "Reselect all SmartList Selections" daily)   Labs   CBC: Recent Labs  Lab 03/02/22 2043 03/03/22 0801  WBC 15.2* 13.6*  HGB 11.4* 8.4*  HCT 34.3* 25.2*  MCV 95.5 95.5  PLT 160 138*    Basic Metabolic Panel: Recent Labs  Lab 03/02/22 2043  NA 135  K 3.9  CL 103  CO2 22  GLUCOSE 99  BUN 11  CREATININE 0.80  CALCIUM 9.2   GFR: Estimated Creatinine Clearance: 129.2 mL/min (by C-G formula based on SCr of 0.8 mg/dL). Recent Labs  Lab 03/02/22 2043 03/03/22 0801  WBC 15.2* 13.6*    Liver Function Tests: Recent Labs  Lab 03/02/22 2043 03/03/22 0936  AST 15 23  ALT 10 12  ALKPHOS 103 104  BILITOT 0.3 0.4  PROT 6.3* 5.8*  ALBUMIN 2.8* 2.7*   No results for input(s): "LIPASE", "AMYLASE" in the last 168 hours. No results for input(s): "AMMONIA" in the last 168 hours.  ABG No results found for: "PHART", "PCO2ART", "PO2ART", "HCO3", "TCO2", "ACIDBASEDEF", "O2SAT"   Coagulation Profile: Recent Labs  Lab 03/03/22 0936  INR 1.2    Cardiac Enzymes: No results for input(s): "CKTOTAL", "CKMB", "CKMBINDEX", "TROPONINI" in the last 168 hours.  HbA1C: No results found for: "HGBA1C"  CBG: No results for  input(s): "GLUCAP" in the last 168 hours.  Review of Systems:   Positives in BOLD: Gen: Denies fever, chills, weight change, fatigue, night sweats HEENT: Denies blurred vision, double vision, hearing loss, tinnitus, sinus congestion, rhinorrhea, sore throat, neck stiffness, dysphagia PULM: Denies shortness of breath, cough, sputum production, hemoptysis, wheezing CV: Denies chest pain, edema, orthopnea, paroxysmal nocturnal dyspnea, palpitations GI: Distended abdomen and uterus GU: Denies dysuria, hematuria, polyuria, oliguria, urethral discharge Endocrine: Denies hot or cold intolerance, polyuria, polyphagia or appetite change Derm: Denies rash, dry skin, scaling or peeling skin change Heme: Denies easy bruising, bleeding, bleeding gums Neuro: Denies headache, numbness, weakness, slurred speech, loss of memory or consciousness   Past Medical History:  She,  has a past medical history of Acid reflux, Hemorrhoid (11/2015), and Tobacco abuse.   Surgical History:   Past Surgical History:  Procedure Laterality Date   NO PAST SURGERIES       Social History:   reports that she  has been smoking cigarettes. She has a 1.25 pack-year smoking history. She has never used smokeless tobacco. She reports that she does not drink alcohol and does not use drugs.   Family History:  Her family history includes Alcohol abuse in her maternal grandfather; Cancer (age of onset: 46) in her maternal grandmother; Congestive Heart Failure in her father; Factor V Leiden deficiency in her father; Heart disease in her father; Hypertension in her father, mother, and paternal grandfather; Migraines in her mother; Stroke in her father. There is no history of Depression.   Allergies Allergies  Allergen Reactions   Penicillins Hives     Home Medications  Prior to Admission medications   Medication Sig Start Date End Date Taking? Authorizing Provider  Blood Pressure Monitoring (BLOOD PRESSURE KIT) DEVI 1 kit by  Does not apply route 2 (two) times daily. Check blood pressure twice a day Patient not taking: Reported on 02/28/2022 02/06/22   Ellwood Sayers, CNM  labetalol (NORMODYNE) 100 MG tablet Take 1 tablet (100 mg total) by mouth 2 (two) times daily. 02/06/22   Dominic, Courtney Heys, CNM  Prenatal Vit-Fe Fumarate-FA (PRENATAL VITAMINS) 28-0.8 MG TABS Take 1 tablet by mouth daily.    [provider]  albuterol (VENTOLIN HFA) 108 (90 Base) MCG/ACT inhaler Inhale 2 puffs into the lungs every 6 (six) hours as needed for wheezing or shortness of breath. 02/14/20 04/22/20  Junie Spencer, FNP  fluticasone (FLONASE) 50 MCG/ACT nasal spray Place 2 sprays into both nostrils daily. 02/14/20 04/22/20  Junie Spencer, FNP  metoprolol tartrate (LOPRESSOR) 25 MG tablet Take 0.5 tablets (12.5 mg total) by mouth 2 (two) times daily as needed (Palpitations). Patient not taking: Reported on 07/08/2019 03/29/19 04/22/20  Shaune Pollack, MD  omeprazole (PRILOSEC) 20 MG capsule Take 1 capsule (20 mg total) by mouth daily. 07/17/19 04/22/20  McVey, Madelaine Bhat, PA-C     Critical care time: 75 minutes     Rhea Bleacher MD [facility]

## 2022-03-03 NOTE — OB Triage Note (Signed)
Dr. Amalia Hailey on unit to assist and assess patient plan of care.

## 2022-03-03 NOTE — OB Triage Note (Addendum)
8:05pm- Pt arrived to unit in severe abdominal pain in stretcher by ED nurse. Patient has vomited during the arrival. Pt looks flushed and sweaty. Pt is G4P2 [redacted]w[redacted]d. Pt moved into bed of OBS4. Pt cleaned off with warm soapy water and washcloths by nursing staff and placed into patient gown. Lawanda Cousins, CNM in department at nurses station and is aware of patients arrival.   8:12pm- With much difficulty two nurses attempted to adjust EFM to patient's abdomen and was unable to discern MHR from FHR. CNM  notified to come into room to assess situation.  2017- while bedside U/S was being performed CNM provider gave orders to notify attending provider Dr. Amalia Hailey of situation. Dr. Amalia Hailey gave orders for a stat OB limited US at bedside to confirm or deny fetal heart tones and signs of life.

## 2022-03-03 NOTE — Progress Notes (Signed)
RN notified CNM of vaginal bleeding and no resting tone on RN assessment. CNM and RN went to bedside to evaluate patient.

## 2022-03-03 NOTE — Progress Notes (Signed)
Progress:  Cervical re-check:  now 6 cm Mentum is now anterior left rather than transversd. (@1  oclock position) Unable to rotate with exam.  A:   Mentum has rotated to anterior on its own.  Slow cervical change  Pt still comfortable - fells well P: Continue toward mentum anterior delivery.  Discussed with pt and family.

## 2022-03-03 NOTE — Transfer of Care (Signed)
Immediate Anesthesia Transfer of Care Note  Patient: Jasmine Gray  Procedure(s) Performed: CESAREAN SECTION APPLICATION OF CELL SAVER  Patient Location: PACU and Mother/Baby  Anesthesia Type:General  Level of Consciousness: awake  Airway & Oxygen Therapy: Patient Spontanous Breathing  Post-op Assessment: Report given to RN  Post vital signs: stable  Last Vitals:  Vitals Value Taken Time  BP 134/80 03/03/22 1835  Temp 36.5 C 03/03/22 1835  Pulse 115 03/03/22 1835  Resp 17 03/03/22 1835  SpO2 94 % 03/03/22 1835    Last Pain:  Vitals:   03/03/22 1835  TempSrc: Oral  PainSc:          Complications: No notable events documented.

## 2022-03-03 NOTE — Progress Notes (Signed)
LABOR NOTE   Jasmine Gray 32 y.o.GP@ at [redacted]w[redacted]d  SUBJECTIVE:  Patient feels well now.  Feels stable.  Since her epidural was turned off she is experiencing some additional abdominal pain.  Her epidural is now turned back on. Analgesia: Epidural  OBJECTIVE:  BP 118/70   Pulse (!) 103   Temp 98.4 F (36.9 C) (Oral)   Resp 18   Ht 5\' 7"  (1.702 m)   Wt 106.5 kg   LMP 06/22/2021 (Exact Date)   SpO2 100%   BMI 36.77 kg/m  Total I/O In: -  Out: 700 [Urine:700]   CERVIX: 3 cm:  50%:   -3:   posterior:   firm SVE:   Dilation: 2 Effacement (%): 50 Station: -2 Exam by:: Dominic CNM CONTRACTIONS: She is contracting every 1 to 2 minutes with approximately 20-30 mvus  AROM:  Thin but bloody amniotic fluid noted at rupture.  Labs: Lab Results  Component Value Date   WBC 13.6 (H) 03/03/2022   HGB 8.4 (L) 03/03/2022   HCT 25.2 (L) 03/03/2022   MCV 95.5 03/03/2022   PLT 138 (L) 03/03/2022    ASSESSMENT: 1) IUFD     2) based on her fibrinogen and her bloody amniotic fluid as well as her contraction pattern it seems likely she has some level of placental abruption.  This was not seen yesterday on ultrasound. 3) her cervix is not particularly favorable at this time and we will continue moving toward vaginal delivery.  I have begun Pitocin and will increase her contraction strength in hopes of changing her cervix. 4) concern for abruption type bleeding behind the placenta with consumption of coags and dropping hemoglobin.  Future possible concern for DIC.   Principal Problem:   Encounter for assessment for fetal demise   PLAN: I have involved Dr. Gaynell Face, MFM from [facility] and we have been discussing this in detail throughout the case. I have involved ICU intensivist to manage her blood products and to make sure she receives appropriate blood products in a timely manner.  They have taken over the ordering and management of all blood products. I plan to continue to increase  Pitocin and move toward delivery as rapidly as possible.  I hope to not perform a cesarean delivery which could increase her risk for DIC and also increase the possible sites where bleeding could occur. Plan continued following of coags, platelets, hemoglobin etc. every 2-4 hours I have spoken with anesthesia regarding maintenance of epidural.  I have asked them to provide large-bore IVs so that we are prepared in case of emergency.   Multiple times this morning I have spoken with the family regarding use of blood products, regarding placental abruption, regarding other MDs involved in her care, regarding plan of vaginal delivery if possible.  I have discussed with them the increased risk of coagulopathy and that this is an emergent situation.  I have answered all of their questions.    Finis Bud, M.D. 03/03/2022 11:01 AM

## 2022-03-03 NOTE — OB Triage Note (Addendum)
0832pm- [place] tech at bedside to perform stat limited OB US. L. Dominic, CNM at bedside for patient's emotional support.

## 2022-03-04 ENCOUNTER — Encounter: Payer: Self-pay | Admitting: Obstetrics and Gynecology

## 2022-03-04 DIAGNOSIS — O9902 Anemia complicating childbirth: Secondary | ICD-10-CM

## 2022-03-04 DIAGNOSIS — O364XX0 Maternal care for intrauterine death, not applicable or unspecified: Principal | ICD-10-CM

## 2022-03-04 DIAGNOSIS — O134 Gestational [pregnancy-induced] hypertension without significant proteinuria, complicating childbirth: Secondary | ICD-10-CM

## 2022-03-04 DIAGNOSIS — D62 Acute posthemorrhagic anemia: Secondary | ICD-10-CM

## 2022-03-04 DIAGNOSIS — Z3A36 36 weeks gestation of pregnancy: Secondary | ICD-10-CM

## 2022-03-04 DIAGNOSIS — O4593 Premature separation of placenta, unspecified, third trimester: Secondary | ICD-10-CM

## 2022-03-04 LAB — BPAM RBC
Blood Product Expiration Date: 202402172359
Blood Product Expiration Date: 202402172359
Blood Product Expiration Date: 202402172359
Blood Product Expiration Date: 202402172359
Blood Product Expiration Date: 202402172359
Blood Product Expiration Date: 202402172359
Blood Product Expiration Date: 202402212359
Blood Product Expiration Date: 202402222359
Blood Product Expiration Date: 202402232359
Blood Product Expiration Date: 202402232359
ISSUE DATE / TIME: 202401281106
ISSUE DATE / TIME: 202401281619
ISSUE DATE / TIME: 202401281658
ISSUE DATE / TIME: 202401281658
ISSUE DATE / TIME: 202401281658
ISSUE DATE / TIME: 202401290028
Unit Type and Rh: 6200
Unit Type and Rh: 6200
Unit Type and Rh: 6200
Unit Type and Rh: 6200
Unit Type and Rh: 6200
Unit Type and Rh: 6200
Unit Type and Rh: 6200
Unit Type and Rh: 6200
Unit Type and Rh: 6200
Unit Type and Rh: 6200

## 2022-03-04 LAB — PREPARE FRESH FROZEN PLASMA
Unit division: 0
Unit division: 0
Unit division: 0
Unit division: 0
Unit division: 0
Unit division: 0
Unit division: 0

## 2022-03-04 LAB — PREPARE PLATELET PHERESIS
Unit division: 0
Unit division: 0

## 2022-03-04 LAB — TYPE AND SCREEN
ABO/RH(D): A POS
Antibody Screen: NEGATIVE
Unit division: 0
Unit division: 0
Unit division: 0
Unit division: 0
Unit division: 0
Unit division: 0
Unit division: 0
Unit division: 0
Unit division: 0
Unit division: 0

## 2022-03-04 LAB — PREPARE CRYOPRECIPITATE: Unit division: 0

## 2022-03-04 LAB — BPAM FFP
Blood Product Expiration Date: 202401282359
Blood Product Expiration Date: 202402022359
Blood Product Expiration Date: 202402022359
Blood Product Expiration Date: 202402022359
Blood Product Expiration Date: 202402022359
Blood Product Expiration Date: 202402022359
Blood Product Expiration Date: 202402022359
Blood Product Expiration Date: 202402022359
Blood Product Expiration Date: 202402022359
ISSUE DATE / TIME: 202401281145
ISSUE DATE / TIME: 202401281658
ISSUE DATE / TIME: 202401281729
ISSUE DATE / TIME: 202401281729
ISSUE DATE / TIME: 202401290916
ISSUE DATE / TIME: 202401291016
Unit Type and Rh: 600
Unit Type and Rh: 6200
Unit Type and Rh: 600
Unit Type and Rh: 600
Unit Type and Rh: 6200
Unit Type and Rh: 6200
Unit Type and Rh: 6200
Unit Type and Rh: 6200
Unit Type and Rh: 6200

## 2022-03-04 LAB — BPAM PLATELET PHERESIS
Blood Product Expiration Date: 202401312359
Blood Product Expiration Date: 202401312359
ISSUE DATE / TIME: 202401281346
ISSUE DATE / TIME: 202401281658
Unit Type and Rh: 5100
Unit Type and Rh: 6200

## 2022-03-04 LAB — CBC
HCT: 25.9 % — ABNORMAL LOW (ref 36.0–46.0)
HCT: 26.8 % — ABNORMAL LOW (ref 36.0–46.0)
Hemoglobin: 9 g/dL — ABNORMAL LOW (ref 12.0–15.0)
Hemoglobin: 9.1 g/dL — ABNORMAL LOW (ref 12.0–15.0)
MCH: 30.3 pg (ref 26.0–34.0)
MCH: 30.2 pg (ref 26.0–34.0)
MCHC: 34.7 g/dL (ref 30.0–36.0)
MCHC: 34 g/dL (ref 30.0–36.0)
MCV: 87.2 fL (ref 80.0–100.0)
MCV: 89 fL (ref 80.0–100.0)
Platelets: 110 10*3/uL — ABNORMAL LOW (ref 150–400)
Platelets: 118 10*3/uL — ABNORMAL LOW (ref 150–400)
RBC: 2.97 MIL/uL — ABNORMAL LOW (ref 3.87–5.11)
RBC: 3.01 MIL/uL — ABNORMAL LOW (ref 3.87–5.11)
RDW: 17.4 % — ABNORMAL HIGH (ref 11.5–15.5)
RDW: 17.6 % — ABNORMAL HIGH (ref 11.5–15.5)
WBC: 11.2 10*3/uL — ABNORMAL HIGH (ref 4.0–10.5)
WBC: 12.3 10*3/uL — ABNORMAL HIGH (ref 4.0–10.5)
nRBC: 0 % (ref 0.0–0.2)
nRBC: 0 % (ref 0.0–0.2)

## 2022-03-04 LAB — BPAM CRYOPRECIPITATE
Blood Product Expiration Date: 202401281712
ISSUE DATE / TIME: 202401281346
Unit Type and Rh: 5100

## 2022-03-04 LAB — PROTIME-INR
INR: 1.1 (ref 0.8–1.2)
INR: 1.1 (ref 0.8–1.2)
Prothrombin Time: 14.1 seconds (ref 11.4–15.2)
Prothrombin Time: 14.1 seconds (ref 11.4–15.2)

## 2022-03-04 LAB — FIBRINOGEN
Fibrinogen: 308 mg/dL (ref 210–475)
Fibrinogen: 416 mg/dL (ref 210–475)

## 2022-03-04 MED ORDER — ACETAMINOPHEN 325 MG PO TABS
650.0000 mg | ORAL_TABLET | ORAL | Status: DC | PRN
  Administered 2022-03-04 – 2022-03-05 (×4): 650 mg via ORAL
  Filled 2022-03-04 (×4): qty 2

## 2022-03-04 MED ORDER — LABETALOL HCL 100 MG PO TABS
100.0000 mg | ORAL_TABLET | Freq: Two times a day (BID) | ORAL | Status: DC
  Administered 2022-03-04 – 2022-03-05 (×3): 100 mg via ORAL
  Filled 2022-03-04 (×3): qty 1

## 2022-03-04 NOTE — Addendum Note (Signed)
Addendum  created 03/04/22 1100 by Doreen Salvage, CRNA   Intraprocedure Meds edited

## 2022-03-04 NOTE — Consult Note (Signed)
NAME:  Jasmine Gray, MRN:  [id number], DOB:  [birth year], LOS: 2 ADMISSION DATE:  03/02/2022, CONSULTATION DATE:  03/03/2022 REFERRING MD:  Dr. Amalia Hailey, CHIEF COMPLAINT:  IUFD concerning for DIC  Brief Pt Description / Synopsis:   Acute anemia and drop in fibrinogen    History of Present Illness:  Per primary HPI: Jasmine Gray is a 32 y.o. [id number] female at [redacted]w[redacted]d dated by 8wk [place].  She presents to L&D for contraction/abdominal pain, vomiting and feeling dizzy.  This evening she started to feel contraction pain, she got in the shower to see if it would improve but the pain got worse, once in the ED she felt dizzy like she was going to faint and she vomited.  She was brought up to triage immediately, on arrival she reported being a lot of pain and "not feeling well". Reports feeling FM today on the way to the ED and on her way up to triage. Denies any LOF/VB.    RN called out as she was unable to auscultate fetal heartrate. BSUS brought to room, fetal heart visualized, movement not noted, absence of  fetal heart rate confirmed by hospital [place] technician.    Nechuma's partner and family members present    Pregnancy Issues: 1. Fetal demise 2. GHTN 3. BMI 36.65  Interim History / Subjective:  Pt tolerating sitting in chair.  Pts RN reports on average pts requiring 1 pad change per shift for vaginal bleeding.  No reported bleeding otherwise.    Objective   Blood pressure 135/71, pulse 93, temperature 98.4 F (36.9 C), temperature source Oral, resp. rate 17, height 5\' 7"  (1.702 m), weight 106.5 kg, last menstrual period 06/22/2021, SpO2 98 %.        Intake/Output Summary (Last 24 hours) at 03/04/2022 0912 Last data filed at 03/04/2022 0725 Gross per 24 hour  Intake 3721.74 ml  Output 4900 ml  Net -1178.26 ml   Filed Weights   03/02/22 2028  Weight: 106.5 kg    Examination: General: Well developed, well nourished female resting in bed, NAD  HENT: Supple, no JVD  Lungs: Clear throughout,  even, non labored  Cardiovascular: NSR, s1s2, no m/r/g, 2+ radial/2+ distal pulses, no edema  Abdomen: hypoactive BS x4, soft, mildly distended, abdominal c section incision with pressure dressing clean/dry Extremities: Normal bulk and tone, moves all extremities  Neuro: CNII-XII grossly intact GU: deferred  Resolved Hospital Problem list    Patient Active Problem List   Diagnosis Date Noted   Encounter for assessment for fetal demise 03/02/2022   [redacted] weeks gestation of pregnancy 02/28/2022   Labor and delivery, indication for care 02/28/2022   Indication for care in labor and delivery, antepartum 02/21/2022   [redacted] weeks gestation of pregnancy 02/20/2022   [redacted] weeks gestation of pregnancy 02/12/2022   Supervision of high risk pregnancy in second trimester 02/06/2022   Gestational hypertension, third trimester 02/06/2022   [redacted] weeks gestation of pregnancy 02/06/2022   Gestational hypertension without significant proteinuria, antepartum 01/16/2022   Trichomonal infection 10/01/2021   Subchorionic hemorrhage 09/30/2021   Supervision of high risk pregnancy, antepartum 08/23/2021   UTI (urinary tract infection) 04/28/2021   History of premature delivery 04/29/2016   Overweight (BMI 25.0-29.9) 07/28/2015   Tobacco user 05/31/2014     Assessment & Plan:  #IUFD #Placental abruption #Acute blood loss anemia  #Thrombocytopenia  #Drop in fibrinogen~resolved  #Hemorrhagic shock~resolved  - Follow CBC; fibrinogen level normalized @416  and PT/INR normalized @14 .1/1.1 can now  check prn  - Monitor for s/sx of bleeding and transfuse for an hgb <8 - Maintain map >65 - Continue to push po fluids if unable to tolerate continue iv fluids   Best Practice (right click and "Reselect all SmartList Selections" daily)   Labs   CBC: Recent Labs  Lab 03/03/22 1235 03/03/22 1607 03/03/22 1855 03/03/22 2345 03/04/22 0601  WBC 14.9* 8.9 13.6* 12.0* 11.2*  NEUTROABS  --  6.9 11.4*  --   --   HGB  9.3* 5.6* 9.4* 8.8* 9.0*  HCT 27.7* 17.0* 28.2* 24.8* 25.9*  MCV 95.5 95.5 89.8 87.9 87.2  PLT 115* 204 116* 117* 110*    Basic Metabolic Panel: Recent Labs  Lab 03/02/22 2043  NA 135  K 3.9  CL 103  CO2 22  GLUCOSE 99  BUN 11  CREATININE 0.80  CALCIUM 9.2   GFR: Estimated Creatinine Clearance: 129.2 mL/min (by C-G formula based on SCr of 0.8 mg/dL). Recent Labs  Lab 03/03/22 1607 03/03/22 1855 03/03/22 2345 03/04/22 0601  WBC 8.9 13.6* 12.0* 11.2*    Liver Function Tests: Recent Labs  Lab 03/02/22 2043 03/03/22 0936  AST 15 23  ALT 10 12  ALKPHOS 103 104  BILITOT 0.3 0.4  PROT 6.3* 5.8*  ALBUMIN 2.8* 2.7*   No results for input(s): "LIPASE", "AMYLASE" in the last 168 hours. No results for input(s): "AMMONIA" in the last 168 hours.  ABG No results found for: "PHART", "PCO2ART", "PO2ART", "HCO3", "TCO2", "ACIDBASEDEF", "O2SAT"   Coagulation Profile: Recent Labs  Lab 03/03/22 1235 03/03/22 1607 03/03/22 1855 03/03/22 2345 03/04/22 0601  INR 1.1 1.2 1.1 1.1 1.1    Cardiac Enzymes: No results for input(s): "CKTOTAL", "CKMB", "CKMBINDEX", "TROPONINI" in the last 168 hours.  HbA1C: No results found for: "HGBA1C"  CBG: No results for input(s): "GLUCAP" in the last 168 hours.  Review of Systems:   Positives in BOLD: Gen: Denies fever, chills, weight change, fatigue, night sweats HEENT: Denies blurred vision, double vision, hearing loss, tinnitus, sinus congestion, rhinorrhea, sore throat, neck stiffness, dysphagia PULM: Denies shortness of breath, cough, sputum production, hemoptysis, wheezing CV: Denies chest pain, edema, orthopnea, paroxysmal nocturnal dyspnea, palpitations GI: abdominal pain at c section incision site  GU: Denies dysuria, hematuria, polyuria, oliguria, urethral discharge Endocrine: Denies hot or cold intolerance, polyuria, polyphagia or appetite change Derm: Denies rash, dry skin, scaling or peeling skin change Heme: Denies  easy bruising, bleeding, bleeding gums Neuro: Denies headache, numbness, weakness, slurred speech, loss of memory or consciousness   Past Medical History:  She,  has a past medical history of Acid reflux, Hemorrhoid (11/2015), and Tobacco abuse.   Surgical History:   Past Surgical History:  Procedure Laterality Date   NO PAST SURGERIES       Social History:   reports that she has been smoking cigarettes. She has a 1.25 pack-year smoking history. She has never used smokeless tobacco. She reports that she does not drink alcohol and does not use drugs.   Family History:  Her family history includes Alcohol abuse in her maternal grandfather; Cancer (age of onset: 32) in her maternal grandmother; Congestive Heart Failure in her father; Factor V Leiden deficiency in her father; Heart disease in her father; Hypertension in her father, mother, and paternal grandfather; Migraines in her mother; Stroke in her father. There is no history of Depression.   Allergies Allergies  Allergen Reactions   Penicillins Hives     Home Medications  Prior to Admission  medications   Medication Sig Start Date End Date Taking? Authorizing Provider  Blood Pressure Monitoring (BLOOD PRESSURE KIT) DEVI 1 kit by Does not apply route 2 (two) times daily. Check blood pressure twice a day Patient not taking: Reported on 02/28/2022 02/06/22   Allen Derry, CNM  labetalol (NORMODYNE) 100 MG tablet Take 1 tablet (100 mg total) by mouth 2 (two) times daily. 02/06/22   Dominic, Nunzio Cobbs, CNM  Prenatal Vit-Fe Fumarate-FA (PRENATAL VITAMINS) 28-0.8 MG TABS Take 1 tablet by mouth daily.    [provider]  albuterol (VENTOLIN HFA) 108 (90 Base) MCG/ACT inhaler Inhale 2 puffs into the lungs every 6 (six) hours as needed for wheezing or shortness of breath. 02/14/20 04/22/20  Sharion Balloon, FNP  fluticasone (FLONASE) 50 MCG/ACT nasal spray Place 2 sprays into both nostrils daily. 02/14/20 04/22/20  Sharion Balloon, FNP  metoprolol tartrate (LOPRESSOR) 25 MG tablet Take 0.5 tablets (12.5 mg total) by mouth 2 (two) times daily as needed (Palpitations). Patient not taking: Reported on 07/08/2019 03/29/19 04/22/20  Duffy Bruce, MD  omeprazole (PRILOSEC) 20 MG capsule Take 1 capsule (20 mg total) by mouth daily. 07/17/19 04/22/20  McVey, Gelene Mink, PA-C     Critical care time: 32 minutes    Pt stabilized PCCM team will sign off on 03/04/22 if you need further assistance please call on call pager# in [place], [place] Pager [phone] (please enter 7 digits) [place] Pager [phone] (please enter 7 digits)

## 2022-03-04 NOTE — Addendum Note (Signed)
Addendum  created 03/04/22 1528 by Doreen Salvage, CRNA   Intraprocedure Meds edited

## 2022-03-04 NOTE — Progress Notes (Signed)
Progress Note - Cesarean Delivery  Jasmine Gray is a 32 y.o. [phone] now PP day 1 s/p C-Section, Low Transverse .   Subjective:  Patient reports no problems with eating, bowel movements, voiding, or their wound  OOB without issue to bathroom and chair.  Objective:  Vital signs in last 24 hours: Temp:  [97.7 F (36.5 C)-99.4 F (37.4 C)] 98.4 F (36.9 C) (01/29 0725) Pulse Rate:  [86-145] 93 (01/29 0725) Resp:  [13-23] 17 (01/29 0500) BP: (109-155)/(52-92) 135/71 (01/29 0700) SpO2:  [94 %-100 %] 98 % (01/29 0725)  Physical Exam:  General: alert, cooperative, and no distress Lochia: appropriate Uterine Fundus: firm Incision: Dressing intact dry    Data Review Recent Labs    03/03/22 2345 03/04/22 0601  HGB 8.8* 9.0*  HCT 24.8* 25.9*    Assessment:  Principal Problem:   Encounter for assessment for fetal demise   Status post Cesarean section. Doing well postoperatively.   Blood loss stable - bleeding issues appear resolved!  Plan:       Continue current care.  Wound care discussed - plan honeycomb.  Stop q4 blood draws.  Begin routine post op CD care.  Finis Bud, M.D. 03/04/2022 9:21 AM

## 2022-03-04 NOTE — Anesthesia Postprocedure Evaluation (Signed)
Anesthesia Post Note  Patient: Jasmine Gray  Procedure(s) Performed: CESAREAN SECTION APPLICATION OF CELL SAVER  Patient location during evaluation: L&D Anesthesia Type: General Level of consciousness: awake and alert Pain management: pain level controlled Vital Signs Assessment: post-procedure vital signs reviewed and stable Respiratory status: spontaneous breathing, nonlabored ventilation and respiratory function stable Cardiovascular status: stable Postop Assessment: no headache, no backache and epidural receding Anesthetic complications: no   No notable events documented.   Last Vitals:  Vitals:   03/04/22 0200 03/04/22 0300  BP: 138/71 (!) 120/56  Pulse: 96 92  Resp: 15 15  Temp:    SpO2: 99% 99%    Last Pain:  Vitals:   03/03/22 1840  TempSrc:   PainSc: 0-No pain                 Corran Lalone Lorenza Chick

## 2022-03-04 NOTE — Progress Notes (Signed)
   03/04/22 1100  Spiritual Encounters  Type of Visit Initial  Care provided to: Pt and family  Conversation partners present during encounter Nurse  Referral source Chaplain team  Reason for visit Grief/loss  OnCall Visit Yes  Spiritual Framework  Presenting Themes Impactful experiences and emotions;Rituals and practive  Strengths supportive family  Needs/Challenges/Barriers grief/loss of child  Patient Stress Factors Loss;Major life changes  Family Stress Factors Loss;Major life changes  Interventions  Spiritual Care Interventions Made Established relationship of care and support;Compassionate presence;Reflective listening;Normalization of emotions;Bereavement/grief support;Meaning making;Prayer;Supported grief process  Intervention Outcomes  Outcomes Connection to spiritual care  [place] will continue to follow   Chaplain responded to follow up care request following fetal demise. Chaplain met with patient and family. Chaplain provided compassionate presence and reflective listening as patient spoke about the emotions surrounding the death of her baby. Patient and family requested a blessing service in the [place] [facility]. Chaplain provided a blessing service attended by patient, family, and staff. Chaplain provided a prayer shawl and anointed baby with holy oil during service. Chaplain provided scripture and prayer as requested. Patient and family appreciated service and blessing. Chaplain services are available for follow up as needed.

## 2022-03-04 NOTE — Progress Notes (Signed)
   03/04/22 1600  Spiritual Encounters  Type of Visit Follow up  Care provided to: Pt and family  Referral source Chaplain team  Reason for visit Grief/loss  OnCall Visit Yes   Chaplain followed up from infant blessing service this morning. Patient and significant other resting comfortably. Chaplain provided compassionate presence as patient shared grief emotions. Patient and family appreciated Chaplain visit.

## 2022-03-05 ENCOUNTER — Ambulatory Visit: Payer: Medicaid Other

## 2022-03-05 ENCOUNTER — Inpatient Hospital Stay: Payer: Medicaid Other | Admitting: Anesthesiology

## 2022-03-05 ENCOUNTER — Other Ambulatory Visit: Payer: Self-pay | Admitting: Advanced Practice Midwife

## 2022-03-05 DIAGNOSIS — G8918 Other acute postprocedural pain: Secondary | ICD-10-CM

## 2022-03-05 LAB — CBC
HCT: 23.1 % — ABNORMAL LOW (ref 36.0–46.0)
Hemoglobin: 7.8 g/dL — ABNORMAL LOW (ref 12.0–15.0)
MCH: 30.8 pg (ref 26.0–34.0)
MCHC: 33.8 g/dL (ref 30.0–36.0)
MCV: 91.3 fL (ref 80.0–100.0)
Platelets: 135 10*3/uL — ABNORMAL LOW (ref 150–400)
RBC: 2.53 MIL/uL — ABNORMAL LOW (ref 3.87–5.11)
RDW: 17.3 % — ABNORMAL HIGH (ref 11.5–15.5)
WBC: 7.7 10*3/uL (ref 4.0–10.5)
nRBC: 0 % (ref 0.0–0.2)

## 2022-03-05 LAB — STREP GP B SUSCEPTIBILITY

## 2022-03-05 LAB — HEMOGLOBIN AND HEMATOCRIT, BLOOD
HCT: 24.9 % — ABNORMAL LOW (ref 36.0–46.0)
Hemoglobin: 8.3 g/dL — ABNORMAL LOW (ref 12.0–15.0)

## 2022-03-05 LAB — STREP GP B CULTURE+RFLX: Strep Gp B Culture+Rflx: POSITIVE — AB

## 2022-03-05 MED ORDER — SODIUM CHLORIDE 0.9 % IV SOLN: INTRAVENOUS | Status: DC | PRN

## 2022-03-05 MED ORDER — DIPHENHYDRAMINE HCL 50 MG/ML IJ SOLN: 12.5000 mg | INTRAMUSCULAR | Status: DC | PRN

## 2022-03-05 MED ORDER — OXYCODONE HCL 5 MG PO TABS: 5.0000 mg | ORAL_TABLET | Freq: Four times a day (QID) | ORAL | 0 refills | Status: DC | PRN

## 2022-03-05 MED ORDER — SODIUM CHLORIDE 0.9 % IV SOLN
300.0000 mg | Freq: Once | INTRAVENOUS | Status: AC
  Administered 2022-03-05: 300 mg via INTRAVENOUS
  Filled 2022-03-05: qty 300

## 2022-03-05 MED ORDER — LACTATED RINGERS IV SOLN: 500.0000 mL | Freq: Once | INTRAVENOUS | Status: DC

## 2022-03-05 MED ORDER — FENTANYL-BUPIVACAINE-NACL 0.5-0.125-0.9 MG/250ML-% EP SOLN: 12.0000 mL/h | EPIDURAL | Status: DC | PRN

## 2022-03-05 MED ORDER — EPHEDRINE 5 MG/ML INJ: 10.0000 mg | INTRAVENOUS | Status: DC | PRN

## 2022-03-05 MED ORDER — LIDOCAINE HCL (PF) 1 % IJ SOLN: INTRAMUSCULAR | Status: DC | PRN

## 2022-03-05 MED ORDER — PHENYLEPHRINE 80 MCG/ML (10ML) SYRINGE FOR IV PUSH (FOR BLOOD PRESSURE SUPPORT): 80.0000 ug | PREFILLED_SYRINGE | INTRAVENOUS | Status: DC | PRN

## 2022-03-05 MED ORDER — LIDOCAINE-EPINEPHRINE (PF) 1.5 %-1:200000 IJ SOLN: INTRAMUSCULAR | Status: DC | PRN

## 2022-03-05 MED ORDER — OXYCODONE HCL 5 MG PO TABS: 5.0000 mg | ORAL_TABLET | Freq: Four times a day (QID) | ORAL | 0 refills | Status: AC | PRN

## 2022-03-05 NOTE — Progress Notes (Signed)
Pt discharged.  Discharge instructions, prescriptions and follow up appointment given to and reviewed with pt. Pt verbalized understanding. Escorted out by staff.

## 2022-03-05 NOTE — Discharge Summary (Addendum)
OB Discharge Summary     Patient Name: Jasmine Gray DOB: 08-01-1990 MRN: [id number]  Date of admission: 03/02/2022 Delivering MD: Jeannie Fend Date of Delivery: 03/03/2022  Date of discharge: 03/05/2022  Admitting diagnosis: Encounter for assessment for fetal demise [O36.80X0] Intrauterine pregnancy: [redacted]w[redacted]d     Secondary diagnosis: Gestational Hypertension     Discharge diagnosis:  IUFD preterm delivered                                                                                                 Post partum procedures:blood transfusion (5 units PRBCs, 2 units cryoprecipate, 2 units FFP)  Augmentation: AROM, Pitocin, and Cytotec  Complications: Placental Abruption, DIC  Hospital course:  Induction of Labor With Cesarean Section   32 y.o. yo [phone] at [redacted]w[redacted]d was admitted to the hospital 03/02/2022 for induction of labor. Patient had a labor course significant for bleeding/malpresentation. The patient went for cesarean section due to [facility] and placental abruption . Delivery details are as follows: Membrane Rupture Time/Date: 9:52 AM ,03/03/2022   Delivery Method:C-Section, Low Transverse  Details of operation can be found in separate operative Note.    Patient had a postpartum course complicated by management of intrapartum bleeding/administration of blood products and continued management postpartum. Please see chart notes for details. By day 2 post op she is ambulating, tolerating a regular diet, passing flatus, and urinating well. She has received an iron infusion and she is asymptomatic for s/s of anemia.   Patient is discharged home in stable condition on 03/05/22.      Newborn Data: Birth date:03/03/2022  Birth time:5:35 PM  Gender:Female  Living status:Fetal Demise  Apgars:0 ,0  Weight:1880 g                                Physical exam  Vitals:   03/05/22 0048 03/05/22 0600 03/05/22 0805 03/05/22 1348  BP: (!) 100/55  123/77 125/74  Pulse: 100  (!) 102 96  Resp:  16  18 20   Temp: 97.7 F (36.5 C) 97.8 F (36.6 C) 98.3 F (36.8 C) 98.5 F (36.9 C)  TempSrc: Oral Oral Oral Oral  SpO2: 100%  100% 100%  Weight:      Height:       General: alert, cooperative, and no distress Lochia: appropriate Uterine Fundus: firm Incision: Dressing is clean, dry, and intact DVT Evaluation: No evidence of DVT seen on physical exam.  Labs: Lab Results  Component Value Date   WBC 7.7 03/05/2022   HGB 8.3 (L) 03/05/2022   HCT 24.9 (L) 03/05/2022   MCV 91.3 03/05/2022   PLT 135 (L) 03/05/2022    Discharge instruction: per After Visit Summary.  Medications:  Allergies as of 03/05/2022       Reactions   Penicillins Hives        Medication List     TAKE these medications    Blood Pressure Kit Devi 1 kit by Does not apply route 2 (two) times daily. Check blood pressure twice a day   labetalol  100 MG tablet Commonly known as: NORMODYNE Take 1 tablet (100 mg total) by mouth 2 (two) times daily.   oxyCODONE 5 MG immediate release tablet Commonly known as: Oxy IR/ROXICODONE Take 1 tablet (5 mg total) by mouth every 6 (six) hours as needed for up to 5 days for severe pain.   Prenatal Vitamins 28-0.8 MG Tabs Take 1 tablet by mouth daily.               Discharge Care Instructions  (From admission, onward)           Start     Ordered   03/05/22 0000  Discharge wound care:       Comments: Keep incision dry, clean.   03/05/22 1540            Diet: routine diet  Activity: Advance as tolerated. Pelvic rest for 6 weeks.   Outpatient follow up:  Follow-up Information     Harlin Heys, MD. Go on 03/13/2022.   Specialties: Obstetrics and Gynecology, Radiology Why: Post-op follow up on 2/7 @ 3:30 pm Contact information: [address] [place] [address] [phone]                   Postpartum contraception: Undecided Rhogam Given postpartum: no Rubella vaccine given postpartum: immune Varicella  vaccine given postpartum: immune TDaP given antepartum or postpartum: given antepartum    Newborn Delivery   Birth date/time: 03/03/2022 17:35:00 Delivery type: C-Section, Low Transverse Trial of labor: Yes C-section categorization: Primary       SIGNED:  Rod Can, CNM 03/05/2022 3:43 PM

## 2022-03-05 NOTE — Progress Notes (Signed)
Obstetric Postpartum/PostOperative Daily Progress Note Subjective:  32 y.o. [id number] post-operative day # 2 status post primary cesarean delivery.  She is ambulating, is tolerating po, is voiding spontaneously.  Her pain is well controlled on PO pain medications. Her lochia is less than menses. She has supportive partner at her side. They are doing well at this time and have been appreciative of the support they are receiving at this facility. She denies any s/s of blood loss anemia. We discussed doing an iron infusion and then a repeat H/H. She expresses an interest in discharging to home later today.    Medications SCHEDULED MEDICATIONS   sodium chloride   Intravenous Once   sodium chloride   Intravenous Once   sodium chloride   Intravenous Once   sodium chloride   Intravenous Once   famotidine  20 mg Oral Daily   labetalol  100 mg Oral BID   prenatal multivitamin  1 tablet Oral Q1200   senna-docusate  2 tablet Oral Q24H   simethicone  80 mg Oral QID    MEDICATION INFUSIONS   sodium chloride 10 mL/hr at 03/05/22 1136   fentaNYL 2 mcg/mL w/bupivacaine 0.125% in NS 250 mL     iron sucrose 300 mg (03/05/22 1142)   lactated ringers     lactated ringers     lactated ringers      PRN MEDICATIONS  sodium chloride, acetaminophen, calcium carbonate, diphenhydrAMINE, diphenhydrAMINE, ePHEDrine, ePHEDrine, fentaNYL (SUBLIMAZE) injection, fentaNYL (SUBLIMAZE) injection, fentaNYL (SUBLIMAZE) injection, fentaNYL 2 mcg/mL w/bupivacaine 0.125% in NS 250 mL, menthol-cetylpyridinium, ondansetron (ZOFRAN) IV, oxyCODONE **OR** oxyCODONE, oxyCODONE, phenylephrine, phenylephrine, zolpidem    Objective:   Vitals:   03/04/22 2005 03/05/22 0048 03/05/22 0600 03/05/22 0805  BP: 135/72 (!) 100/55  123/77  Pulse: 88 100  (!) 102  Resp: 16 16  18   Temp: 97.7 F (36.5 C) 97.7 F (36.5 C) 97.8 F (36.6 C) 98.3 F (36.8 C)  TempSrc: Oral Oral Oral Oral  SpO2: 100% 100%  100%  Weight:      Height:         Current Vital Signs 24h Vital Sign Ranges  T 98.3 F (36.8 C) Temp  Avg: 98.4 F (36.9 C)  Min: 97.7 F (36.5 C)  Max: 99.8 F (37.7 C)  BP 123/77 BP  Min: 100/55  Max: 154/73  HR (!) 102 Pulse  Avg: 103.6  Min: 88  Max: 115  RR 18 Resp  Avg: 16.5  Min: 16  Max: 18  SaO2 100 % Nasal Cannula SpO2  Avg: 99.4 %  Min: 97 %  Max: 100 %       24 Hour I/O Current Shift I/O  Time Ins Outs 01/29 0701 - 01/30 0700 In: 440 [P.O.:440] Out: 600 [Urine:600] No intake/output data recorded.   General: NAD Pulmonary: no increased work of breathing Abdomen: non-distended, non-tender, fundus firm at level of umbilicus Inc: Clean/dry/intact Extremities: no edema, no erythema, no tenderness  Labs:  Recent Labs  Lab 03/04/22 0601 03/04/22 1130 03/05/22 0553  WBC 11.2* 12.3* 7.7  HGB 9.0* 9.1* 7.8*  HCT 25.9* 26.8* 23.1*  PLT 110* 118* 135*     Assessment:   32 y.o. [id number] postoperative day # 2 status post primary cesarean section, fetal demise  Plan:  1) Acute blood loss anemia - hemodynamically stable and asymptomatic - po ferrous sulfate - Venofer Iron infusion - Repeat H/H  2) A POS / Rubella 2.88 (07/31 1002)/ Varicella Immune  3) TDAP status  given antepartum  4) Contraception = Depo-Provera at postpartum visit  5) Disposition: likely discharge to home later today   Rod Can, CNM 03/05/2022 12:53 PM

## 2022-03-05 NOTE — Anesthesia Procedure Notes (Deleted)
Epidural Patient location during procedure: OB End time: 03/05/2022 6:43 AM  Staffing Anesthesiologist: Molli Barrows, MD Performed: anesthesiologist   Preanesthetic Checklist Completed: patient identified, IV checked, site marked, risks and benefits discussed, surgical consent, monitors and equipment checked, pre-op evaluation and timeout performed  Epidural Patient position: sitting Prep: Betadine Patient monitoring: heart rate, continuous pulse ox and blood pressure Approach: midline Location: L4-L5 Injection technique: LOR saline  Needle:  Needle type: Tuohy  Needle gauge: 17 G Needle length: 9 cm and 9 Needle insertion depth: 6 cm Catheter type: closed end flexible Catheter size: 19 Gauge Catheter at skin depth: 12 cm Test dose: negative and 1.5% lidocaine with Epi 1:200 K  Assessment Events: blood not aspirated, no cerebrospinal fluid, injection not painful, no injection resistance, no paresthesia and negative IV test  Additional Notes Disregard this note, Procedure note entered by Dr. Andree Elk on the wrong patient  Patient tolerated the insertion well without complications.Reason for block:procedure for pain

## 2022-03-05 NOTE — Progress Notes (Signed)
Roxicodone reordered to [place]. Not available at patient's preferred pharmacy.

## 2022-03-05 NOTE — Anesthesia Preprocedure Evaluation (Signed)
Anesthesia Evaluation  Patient identified by MRN, date of birth, ID band Patient awake    Reviewed: Allergy & Precautions, H&P , NPO status , Patient's Chart, lab work & pertinent test results, reviewed documented beta blocker date and time   Airway Mallampati: II  TM Distance: >3 FB Neck ROM: full    Dental no notable dental hx. (+) Teeth Intact   Pulmonary neg pulmonary ROS, Current Smoker   Pulmonary exam normal breath sounds clear to auscultation       Cardiovascular Exercise Tolerance: Good hypertension,  Rhythm:regular Rate:Normal     Neuro/Psych negative neurological ROS  negative psych ROS   GI/Hepatic Neg liver ROS,GERD  ,,  Endo/Other  negative endocrine ROSdiabetes, Well Controlled    Renal/GU negative Renal ROS  negative genitourinary   Musculoskeletal   Abdominal   Peds  Hematology negative hematology ROS (+)   Anesthesia Other Findings   Reproductive/Obstetrics (+) Pregnancy                             Anesthesia Physical Anesthesia Plan  ASA: 2  Anesthesia Plan: Epidural   Post-op Pain Management:    Induction:   PONV Risk Score and Plan:   Airway Management Planned:   Additional Equipment:   Intra-op Plan:   Post-operative Plan:   Informed Consent: I have reviewed the patients History and Physical, chart, labs and discussed the procedure including the risks, benefits and alternatives for the proposed anesthesia with the patient or authorized representative who has indicated his/her understanding and acceptance.       Plan Discussed with:   Anesthesia Plan Comments:        Anesthesia Quick Evaluation

## 2022-03-06 LAB — BPAM CRYOPRECIPITATE
Blood Product Expiration Date: 202401282356
Unit Type and Rh: 6200

## 2022-03-06 LAB — PREPARE CRYOPRECIPITATE: Unit division: 0

## 2022-03-07 ENCOUNTER — Encounter: Payer: Medicaid Other | Admitting: Obstetrics and Gynecology

## 2022-03-07 DIAGNOSIS — O0990 Supervision of high risk pregnancy, unspecified, unspecified trimester: Secondary | ICD-10-CM

## 2022-03-07 DIAGNOSIS — Z3A36 36 weeks gestation of pregnancy: Secondary | ICD-10-CM

## 2022-03-08 ENCOUNTER — Ambulatory Visit: Payer: Medicaid Other

## 2022-03-08 ENCOUNTER — Encounter: Payer: Self-pay | Admitting: Obstetrics and Gynecology

## 2022-03-10 ENCOUNTER — Other Ambulatory Visit: Payer: Self-pay | Admitting: Licensed Practical Nurse

## 2022-03-10 DIAGNOSIS — O133 Gestational [pregnancy-induced] hypertension without significant proteinuria, third trimester: Secondary | ICD-10-CM

## 2022-03-11 ENCOUNTER — Other Ambulatory Visit: Payer: Self-pay

## 2022-03-11 DIAGNOSIS — O133 Gestational [pregnancy-induced] hypertension without significant proteinuria, third trimester: Secondary | ICD-10-CM

## 2022-03-11 MED ORDER — LABETALOL HCL 100 MG PO TABS: 100.0000 mg | ORAL_TABLET | Freq: Two times a day (BID) | ORAL | 1 refills | Status: DC

## 2022-03-11 NOTE — Progress Notes (Signed)
Patient called to request that her BP medication needs to be called in. Checked her chart. BP medication was pending. I sent it to pharmacy.

## 2022-03-13 ENCOUNTER — Encounter: Payer: Self-pay | Admitting: Obstetrics and Gynecology

## 2022-03-13 ENCOUNTER — Ambulatory Visit (INDEPENDENT_AMBULATORY_CARE_PROVIDER_SITE_OTHER): Payer: Medicaid Other | Admitting: Obstetrics and Gynecology

## 2022-03-13 VITALS — BP 145/94 | HR 85 | Ht 67.0 in | Wt 223.0 lb

## 2022-03-13 DIAGNOSIS — Z3042 Encounter for surveillance of injectable contraceptive: Secondary | ICD-10-CM

## 2022-03-13 DIAGNOSIS — Z9889 Other specified postprocedural states: Secondary | ICD-10-CM

## 2022-03-13 MED ORDER — MEDROXYPROGESTERONE ACETATE 150 MG/ML IM SUSP
150.0000 mg | Freq: Once | INTRAMUSCULAR | Status: AC
  Administered 2022-03-13: 150 mg via INTRAMUSCULAR

## 2022-03-13 NOTE — Progress Notes (Signed)
HPI:      [name] is a 32 y.o. [phone] who LMP was No LMP recorded.  Subjective:   She presents today 1 week from cesarean delivery for term fetal loss.  She received multiple units of blood FFP platelets and cryoprecipitate during her hospital stay.  She had a massive abruption.  She reports that she is doing well at this time.  Minimal vaginal bleeding.  No issues with her wound.  Ambulating voiding p.o. without difficulty.  Pain controlled.  She says she is getting better by each day with her sadness.    Hx: The following portions of the patient's history were reviewed and updated as appropriate:             She  has a past medical history of Acid reflux, Hemorrhoid (11/2015), and Tobacco abuse. She does not have any pertinent problems on file. She  has a past surgical history that includes No past surgeries and Cesarean section (03/03/2022). Her family history includes Alcohol abuse in her maternal grandfather; Cancer (age of onset: 51) in her maternal grandmother; Congestive Heart Failure in her father; Factor V Leiden deficiency in her father; Heart disease in her father; Hypertension in her father, mother, and paternal grandfather; Migraines in her mother; Stroke in her father. She  reports that she has been smoking cigarettes. She has a 1.25 pack-year smoking history. She has never used smokeless tobacco. She reports that she does not drink alcohol and does not use drugs. She has a current medication list which includes the following prescription(s): labetalol, prenatal vitamins, [DISCONTINUED] albuterol, [DISCONTINUED] fluticasone, [DISCONTINUED] metoprolol tartrate, and [DISCONTINUED] omeprazole. She is allergic to penicillins.       Review of Systems:  Review of Systems  Constitutional: Denied constitutional symptoms, night sweats, recent illness, fatigue, fever, insomnia and weight loss.  Eyes: Denied eye symptoms, eye pain, photophobia, vision change and visual  disturbance.  Ears/Nose/Throat/Neck: Denied ear, nose, throat or neck symptoms, hearing loss, nasal discharge, sinus congestion and sore throat.  Cardiovascular: Denied cardiovascular symptoms, arrhythmia, chest pain/pressure, edema, exercise intolerance, orthopnea and palpitations.  Respiratory: Denied pulmonary symptoms, asthma, pleuritic pain, productive sputum, cough, dyspnea and wheezing.  Gastrointestinal: Denied, gastro-esophageal reflux, melena, nausea and vomiting.  Genitourinary: Denied genitourinary symptoms including symptomatic vaginal discharge, pelvic relaxation issues, and urinary complaints.  Musculoskeletal: Denied musculoskeletal symptoms, stiffness, swelling, muscle weakness and myalgia.  Dermatologic: Denied dermatology symptoms, rash and scar.  Neurologic: Denied neurology symptoms, dizziness, headache, neck pain and syncope.  Psychiatric: Denied psychiatric symptoms, anxiety and depression.  Endocrine: Denied endocrine symptoms including hot flashes and night sweats.   Meds:   Current Outpatient Medications on File Prior to Visit  Medication Sig Dispense Refill   labetalol (NORMODYNE) 100 MG tablet Take 1 tablet (100 mg total) by mouth 2 (two) times daily. 30 tablet 1   Prenatal Vit-Fe Fumarate-FA (PRENATAL VITAMINS) 28-0.8 MG TABS Take 1 tablet by mouth daily.     [DISCONTINUED] albuterol (VENTOLIN HFA) 108 (90 Base) MCG/ACT inhaler Inhale 2 puffs into the lungs every 6 (six) hours as needed for wheezing or shortness of breath. 8 g 0   [DISCONTINUED] fluticasone (FLONASE) 50 MCG/ACT nasal spray Place 2 sprays into both nostrils daily. 16 g 6   [DISCONTINUED] metoprolol tartrate (LOPRESSOR) 25 MG tablet Take 0.5 tablets (12.5 mg total) by mouth 2 (two) times daily as needed (Palpitations). (Patient not taking: Reported on 07/08/2019) 20 tablet 0   [DISCONTINUED] omeprazole (PRILOSEC) 20 MG capsule Take 1  capsule (20 mg total) by mouth daily. 30 capsule 0   No current  facility-administered medications on file prior to visit.      Objective:     Vitals:   03/13/22 1543 03/13/22 1555  BP: (!) 168/106 (!) 145/94  Pulse: 86 85   Filed Weights   03/13/22 1543  Weight: 223 lb (101.2 kg)               Abdomen: Soft.  Non-tender.  No masses.  No HSM.  Incision/s: Intact.  Healing well.  No erythema.  No drainage.             Assessment:    [id number] Patient Active Problem List   Diagnosis Date Noted   Postpartum care following cesarean delivery 03/05/2022   Encounter for assessment for fetal demise 03/02/2022   Labor and delivery, indication for care 02/28/2022   Gestational hypertension, third trimester 02/06/2022   Gestational hypertension without significant proteinuria, antepartum 01/16/2022   Supervision of high risk pregnancy, antepartum 08/23/2021   History of premature delivery 04/29/2016   Overweight (BMI 25.0-29.9) 07/28/2015   Tobacco user 05/31/2014     1. Postoperative state   2. Postpartum care following cesarean delivery   3. Encounter for management and injection of depo-Provera     Discussed delivery and hospital care in detail.  All questions answered   Plan:            1.  Wound care discussed.  2.  Plan follow-up in 5 weeks. Orders No orders of the defined types were placed in this encounter.    Meds ordered this encounter  Medications   medroxyPROGESTERone (DEPO-PROVERA) injection 150 mg      F/U  Return for Depo Injection Next appointment due 05/30/22-06/13/22.Finis Bud, M.D. 03/13/2022 4:12 PM

## 2022-03-13 NOTE — Progress Notes (Signed)
Patient presents today for 1 week postpartum follow-up. Patient had a cesarean delivery.  She states no issues with engorgement at this time. Patient is continuing to take her labetalol. She states she would like depo for birth control. EPDS score of 10 . She states no other questions or concerns at this time.   Date last pap: 09/27/21. Last Depo-Provera: first dose. Side Effects if any: n/a. Serum HCG indicated? N/a. Depo-Provera 150 mg IM given by: Douglass Rivers, CMA. Next appointment due 05/30/22-06/13/22.

## 2022-03-18 ENCOUNTER — Encounter: Payer: Self-pay | Admitting: Obstetrics and Gynecology

## 2022-03-18 ENCOUNTER — Encounter: Payer: Self-pay | Admitting: Emergency Medicine

## 2022-03-18 ENCOUNTER — Emergency Department
Admission: EM | Admit: 2022-03-18 | Discharge: 2022-03-18 | Disposition: A | Discharge status: Home / Self Care | Payer: Medicaid Other | Attending: Emergency Medicine | Admitting: Emergency Medicine

## 2022-03-18 ENCOUNTER — Other Ambulatory Visit: Payer: Self-pay

## 2022-03-18 ENCOUNTER — Emergency Department: Payer: Medicaid Other

## 2022-03-18 DIAGNOSIS — M79661 Pain in right lower leg: Secondary | ICD-10-CM | POA: Diagnosis present

## 2022-03-18 DIAGNOSIS — I82461 Acute embolism and thrombosis of right calf muscular vein: Secondary | ICD-10-CM | POA: Diagnosis not present

## 2022-03-18 DIAGNOSIS — F17210 Nicotine dependence, cigarettes, uncomplicated: Secondary | ICD-10-CM | POA: Insufficient documentation

## 2022-03-18 LAB — COMPREHENSIVE METABOLIC PANEL
ALT: 13 U/L (ref 0–44)
AST: 14 U/L — ABNORMAL LOW (ref 15–41)
Albumin: 4 g/dL (ref 3.5–5.0)
Alkaline Phosphatase: 87 U/L (ref 38–126)
Anion gap: 9 (ref 5–15)
BUN: 8 mg/dL (ref 6–20)
CO2: 22 mmol/L (ref 22–32)
Calcium: 9.1 mg/dL (ref 8.9–10.3)
Chloride: 106 mmol/L (ref 98–111)
Creatinine, Ser: 0.71 mg/dL (ref 0.44–1.00)
GFR, Estimated: >60 mL/min (ref >60)
Glucose, Bld: 101 mg/dL — ABNORMAL HIGH (ref 70–99)
Potassium: 3.7 mmol/L (ref 3.5–5.1)
Sodium: 137 mmol/L (ref 135–145)
Total Bilirubin: 0.5 mg/dL (ref 0.3–1.2)
Total Protein: 7.7 g/dL (ref 6.5–8.1)

## 2022-03-18 LAB — CBC WITH DIFFERENTIAL/PLATELET
Abs Immature Granulocytes: 0.04 10*3/uL (ref 0.00–0.07)
Basophils Absolute: 0 10*3/uL (ref 0.0–0.1)
Basophils Relative: 0 %
Eosinophils Absolute: 0.1 10*3/uL (ref 0.0–0.5)
Eosinophils Relative: 1 %
HCT: 38.6 % (ref 36.0–46.0)
Hemoglobin: 12.1 g/dL (ref 12.0–15.0)
Immature Granulocytes: 1 %
Lymphocytes Relative: 23 %
Lymphs Abs: 1.9 10*3/uL (ref 0.7–4.0)
MCH: 29.5 pg (ref 26.0–34.0)
MCHC: 31.3 g/dL (ref 30.0–36.0)
MCV: 94.1 fL (ref 80.0–100.0)
Monocytes Absolute: 0.5 10*3/uL (ref 0.1–1.0)
Monocytes Relative: 6 %
Neutro Abs: 5.6 10*3/uL (ref 1.7–7.7)
Neutrophils Relative %: 69 %
Platelets: 305 10*3/uL (ref 150–400)
RBC: 4.1 MIL/uL (ref 3.87–5.11)
RDW: 15.7 % — ABNORMAL HIGH (ref 11.5–15.5)
WBC: 8.1 10*3/uL (ref 4.0–10.5)
nRBC: 0 % (ref 0.0–0.2)

## 2022-03-18 MED ORDER — APIXABAN (ELIQUIS) VTE STARTER PACK (10MG AND 5MG): ORAL_TABLET | ORAL | 0 refills | Status: DC

## 2022-03-18 NOTE — ED Triage Notes (Signed)
Had c-section 2 weeks ago. C/O right calf pain x 4 days.

## 2022-03-18 NOTE — Discharge Instructions (Addendum)
-  Your ultrasound shows that you most likely have a blood clot in your right calf, otherwise known as a deep vein thrombosis (DVT).  Please take the apixaban as prescribed.  In many cases, this will only need to be taken for 3 months.  While you are on this medication, please take specific care to avoid any falls or injuries.  Bleeding can be worsened while on anticoagulant such as apixaban.  -Please follow-up with your primary care provider within the next week for reevaluation.  -Return to the emergency department anytime if you begin to experience any new or worsening symptoms.

## 2022-03-18 NOTE — ED Provider Notes (Signed)
[facility] Provider Note    Event Date/Time   First MD Initiated Contact with Patient 03/18/22 1627     (approximate)   History   Chief Complaint Leg Pain   HPI Jasmine Gray is a 32 y.o. female, history of tobacco use (half a pack a day for over 10 years), GERD, presents to the emergency department for evaluation of right calf pain x 4 days.  She states that she had a C-section 2 weeks ago for a stillborn baby.  Over the past 4 days, she has had pain with ambulation in the right calf.  Denies any recent falls or injuries.  Denies fever/chills, chest pain, shortness of breath, abdominal pain, nausea/vomiting, diarrhea, urinary symptoms, paresthesias, dizziness/lightheadedness, or rashes.  History Limitations: No limitations.        Physical Exam  Triage Vital Signs: ED Triage Vitals  Enc Vitals Group     BP 03/18/22 1508 (!) 167/98     Pulse Rate 03/18/22 1508 99     Resp 03/18/22 1508 20     Temp 03/18/22 1508 98.4 F (36.9 C)     Temp Source 03/18/22 1508 Oral     SpO2 03/18/22 1508 100 %     Weight 03/18/22 1512 223 lb (101.2 kg)     Height 03/18/22 1512 5' 7"$  (1.702 m)     Head Circumference --      Peak Flow --      Pain Score 03/18/22 1615 5     Pain Loc --      Pain Edu? --      Excl. in [place]? --     Most recent vital signs: Vitals:   03/18/22 1616 03/18/22 1926  BP: (!) 126/91 124/80  Pulse:  88  Resp:  17  Temp:  98.8 F (37.1 C)  SpO2:  99%    General: Awake, NAD.  Skin: Warm, dry. No rashes or lesions.  Eyes: PERRL. Conjunctivae normal.  CV: Good peripheral perfusion.  Resp: Normal effort.  Abd: Soft, non-tender. No distention.  Neuro: At baseline. No gross neurological deficits.  Musculoskeletal: Normal ROM of all extremities.  Focused Exam: No gross deformities to the right lower extremity.  No overlying warmth or erythema.  No significant tenderness along the popliteal space or calf.  There does appear to be pain  elicited in the calf with dorsiflexion.  PMS intact distally.  Physical Exam    ED Results / Procedures / Treatments  Labs (all labs ordered are listed, but only abnormal results are displayed) Labs Reviewed  CBC WITH DIFFERENTIAL/PLATELET - Abnormal; Notable for the following components:      Result Value   RDW 15.7 (*)    All other components within normal limits  COMPREHENSIVE METABOLIC PANEL - Abnormal; Notable for the following components:   Glucose, Bld 101 (*)    AST 14 (*)    All other components within normal limits     EKG N/A.    RADIOLOGY  ED Provider Interpretation: I personally reviewed and interpreted this ultrasound, findings suggestive of below the knee thrombus.  US Venous Img Lower Right (DVT Study)  Result Date: 03/18/2022 CLINICAL DATA:  Calf pain for 4 days EXAM: Right LOWER EXTREMITY VENOUS DOPPLER ULTRASOUND TECHNIQUE: Gray-scale sonography with compression, as well as color and duplex ultrasound, were performed to evaluate the deep venous system(s) from the level of the common femoral vein through the popliteal and proximal calf veins. COMPARISON:  None Available.  FINDINGS: VENOUS Normal compressibility of the common femoral, superficial femoral, and popliteal veins. Visualized portions of profunda femoral vein and great saphenous vein unremarkable. Doppler waveforms show normal direction of venous flow, normal respiratory plasticity and response to augmentation. However there is poor compression along veins in the calf region posteriorly consistent with below-the-knee thrombus. Limited views of the contralateral common femoral vein are unremarkable. OTHER None. Limitations: none IMPRESSION: There is no evidence of deep venous thrombosis in the right lower extremity above the knee. However there is poor compression along veins in the calf consistent with below-the-knee thrombus. Electronically Signed   By: Jill Side M.D.   On: 03/18/2022 17:39     PROCEDURES:  Critical Care performed: N/A.  Procedures    MEDICATIONS ORDERED IN ED: Medications - No data to display   IMPRESSION / MDM / [place] / ED COURSE  I reviewed the triage vital signs and the nursing notes.                              Differential diagnosis includes, but is not limited to, peripheral arterial occlusion, thrombophlebitis, DVT, gastrocnemius strain, Baker's cyst.  ED Course Patient appears well, vitals within normal limits.  NAD.  CBC shows no leukocytosis or anemia.  CMP shows no electrolyte abnormalities, AKI, or transaminitis.  Assessment/Plan Patient presents with right calf pain x 4 days in the setting of chronic tobacco use and recent pregnancy.  No significant abnormalities on physical exam, however her ultrasound does show poor compression along veins in the calf consistent with below the knee thrombus.  Her labs are reassuring, no evidence of renal dysfunction.  She is still ambulatory does not have any significant comorbidities.  Not currently breast-feeding as her recent birth was a stillborn.  Not exhibiting any signs or symptoms of pulmonary embolism.  I believe she is a good candidate for outpatient therapy.  Will provide her with a prescription for apixaban.  Advised her to follow-up with her primary care provider within the next week for reevaluation.  She was amenable to this.  Will discharge.  Considered admission for this patient, but given her stable presentation and close access to follow-up, she is unlikely to benefit from admission.  Provided the patient with anticipatory guidance, return precautions, and educational material. Encouraged the patient to return to the emergency department at any time if they begin to experience any new or worsening symptoms. Patient expressed understanding and agreed with the plan.   Patient's presentation is most consistent with acute complicated illness / injury requiring diagnostic  workup.       FINAL CLINICAL IMPRESSION(S) / ED DIAGNOSES   Final diagnoses:  Acute deep vein thrombosis (DVT) of calf muscle vein of right lower extremity ([place])     Rx / DC Orders   ED Discharge Orders          Ordered    APIXABAN (ELIQUIS) VTE STARTER PACK (10MG AND 5MG)        03/18/22 1911             Note:  This document was prepared using Dragon voice recognition software and may include unintentional dictation errors.   Teodoro Spray, [place] 03/18/22 1928    Rada Hay, MD 03/18/22 2229

## 2022-03-20 ENCOUNTER — Emergency Department: Payer: Medicaid Other

## 2022-03-20 ENCOUNTER — Other Ambulatory Visit: Payer: Self-pay

## 2022-03-20 ENCOUNTER — Inpatient Hospital Stay (HOSPITAL_BASED_OUTPATIENT_CLINIC_OR_DEPARTMENT_OTHER)
Admit: 2022-03-20 | Discharge: 2022-03-20 | Disposition: A | Discharge status: Home / Self Care | Payer: Medicaid Other | Attending: Internal Medicine | Admitting: Internal Medicine

## 2022-03-20 ENCOUNTER — Observation Stay
Admission: EM | Admit: 2022-03-20 | Discharge: 2022-03-22 | Disposition: A | Discharge status: Home / Self Care | Payer: Medicaid Other | Attending: Family Medicine | Admitting: Family Medicine

## 2022-03-20 DIAGNOSIS — R079 Chest pain, unspecified: Secondary | ICD-10-CM | POA: Diagnosis present

## 2022-03-20 DIAGNOSIS — I82409 Acute embolism and thrombosis of unspecified deep veins of unspecified lower extremity: Secondary | ICD-10-CM | POA: Diagnosis not present

## 2022-03-20 DIAGNOSIS — Z79899 Other long term (current) drug therapy: Secondary | ICD-10-CM | POA: Insufficient documentation

## 2022-03-20 DIAGNOSIS — I2699 Other pulmonary embolism without acute cor pulmonale: Principal | ICD-10-CM | POA: Insufficient documentation

## 2022-03-20 DIAGNOSIS — Z7901 Long term (current) use of anticoagulants: Secondary | ICD-10-CM | POA: Insufficient documentation

## 2022-03-20 DIAGNOSIS — F17210 Nicotine dependence, cigarettes, uncomplicated: Secondary | ICD-10-CM | POA: Insufficient documentation

## 2022-03-20 DIAGNOSIS — K8020 Calculus of gallbladder without cholecystitis without obstruction: Secondary | ICD-10-CM | POA: Diagnosis not present

## 2022-03-20 DIAGNOSIS — Z720 Tobacco use: Secondary | ICD-10-CM | POA: Diagnosis present

## 2022-03-20 DIAGNOSIS — Z1152 Encounter for screening for COVID-19: Secondary | ICD-10-CM | POA: Insufficient documentation

## 2022-03-20 DIAGNOSIS — Z8759 Personal history of other complications of pregnancy, childbirth and the puerperium: Secondary | ICD-10-CM

## 2022-03-20 DIAGNOSIS — I10 Essential (primary) hypertension: Secondary | ICD-10-CM | POA: Diagnosis not present

## 2022-03-20 DIAGNOSIS — Z86711 Personal history of pulmonary embolism: Secondary | ICD-10-CM | POA: Diagnosis present

## 2022-03-20 LAB — CBC WITH DIFFERENTIAL/PLATELET
Abs Immature Granulocytes: 0.04 10*3/uL (ref 0.00–0.07)
Basophils Absolute: 0 10*3/uL (ref 0.0–0.1)
Basophils Relative: 0 %
Eosinophils Absolute: 0.1 10*3/uL (ref 0.0–0.5)
Eosinophils Relative: 2 %
HCT: 37.1 % (ref 36.0–46.0)
Hemoglobin: 11.8 g/dL — ABNORMAL LOW (ref 12.0–15.0)
Immature Granulocytes: 1 %
Lymphocytes Relative: 25 %
Lymphs Abs: 1.8 10*3/uL (ref 0.7–4.0)
MCH: 29.8 pg (ref 26.0–34.0)
MCHC: 31.8 g/dL (ref 30.0–36.0)
MCV: 93.7 fL (ref 80.0–100.0)
Monocytes Absolute: 0.6 10*3/uL (ref 0.1–1.0)
Monocytes Relative: 8 %
Neutro Abs: 4.8 10*3/uL (ref 1.7–7.7)
Neutrophils Relative %: 64 %
Platelets: 308 10*3/uL (ref 150–400)
RBC: 3.96 MIL/uL (ref 3.87–5.11)
RDW: 15.5 % (ref 11.5–15.5)
WBC: 7.4 10*3/uL (ref 4.0–10.5)
nRBC: 0 % (ref 0.0–0.2)

## 2022-03-20 LAB — PROTIME-INR
INR: 1.2 (ref 0.8–1.2)
Prothrombin Time: 14.6 seconds (ref 11.4–15.2)

## 2022-03-20 LAB — URINALYSIS, ROUTINE W REFLEX MICROSCOPIC
Bacteria, UA: NONE SEEN
Bilirubin Urine: NEGATIVE
Glucose, UA: NEGATIVE mg/dL
Ketones, ur: NEGATIVE mg/dL
Leukocytes,Ua: NEGATIVE
Nitrite: NEGATIVE
Protein, ur: NEGATIVE mg/dL
Specific Gravity, Urine: 1.026 (ref 1.005–1.030)
pH: 6 (ref 5.0–8.0)

## 2022-03-20 LAB — LIPASE, BLOOD: Lipase: 27 U/L (ref 11–51)

## 2022-03-20 LAB — COMPREHENSIVE METABOLIC PANEL
ALT: 12 U/L (ref 0–44)
AST: 14 U/L — ABNORMAL LOW (ref 15–41)
Albumin: 4 g/dL (ref 3.5–5.0)
Alkaline Phosphatase: 92 U/L (ref 38–126)
Anion gap: 10 (ref 5–15)
BUN: 11 mg/dL (ref 6–20)
CO2: 23 mmol/L (ref 22–32)
Calcium: 9.1 mg/dL (ref 8.9–10.3)
Chloride: 104 mmol/L (ref 98–111)
Creatinine, Ser: 0.73 mg/dL (ref 0.44–1.00)
GFR, Estimated: >60 mL/min (ref >60)
Glucose, Bld: 99 mg/dL (ref 70–99)
Potassium: 3.4 mmol/L — ABNORMAL LOW (ref 3.5–5.1)
Sodium: 137 mmol/L (ref 135–145)
Total Bilirubin: 0.3 mg/dL (ref 0.3–1.2)
Total Protein: 8.1 g/dL (ref 6.5–8.1)

## 2022-03-20 LAB — RESP PANEL BY RT-PCR (RSV, FLU A&B, COVID)  RVPGX2
Influenza A by PCR: NEGATIVE
Influenza B by PCR: NEGATIVE
Resp Syncytial Virus by PCR: NEGATIVE
SARS Coronavirus 2 by RT PCR: NEGATIVE

## 2022-03-20 LAB — HEPARIN LEVEL (UNFRACTIONATED): Heparin Unfractionated: >1.1 IU/mL — ABNORMAL HIGH (ref 0.30–0.70)

## 2022-03-20 LAB — APTT
aPTT: 38 seconds — ABNORMAL HIGH (ref 24–36)
aPTT: 38 seconds — ABNORMAL HIGH (ref 24–36)
aPTT: 44 seconds — ABNORMAL HIGH (ref 24–36)

## 2022-03-20 LAB — TROPONIN I (HIGH SENSITIVITY): Troponin I (High Sensitivity): <2 ng/L (ref <18)

## 2022-03-20 MED ORDER — ONDANSETRON HCL 4 MG PO TABS: 4.0000 mg | ORAL_TABLET | Freq: Four times a day (QID) | ORAL | Status: DC | PRN

## 2022-03-20 MED ORDER — IOHEXOL 350 MG/ML SOLN
75.0000 mL | Freq: Once | INTRAVENOUS | Status: AC | PRN
  Administered 2022-03-20: 75 mL via INTRAVENOUS

## 2022-03-20 MED ORDER — HEPARIN BOLUS VIA INFUSION
4000.0000 [IU] | Freq: Once | INTRAVENOUS | Status: AC
  Administered 2022-03-20: 4000 [IU] via INTRAVENOUS
  Filled 2022-03-20: qty 4000

## 2022-03-20 MED ORDER — ONDANSETRON HCL 4 MG/2ML IJ SOLN: 4.0000 mg | Freq: Four times a day (QID) | INTRAMUSCULAR | Status: DC | PRN

## 2022-03-20 MED ORDER — PRENATAL MULTIVITAMIN CH
1.0000 | ORAL_TABLET | Freq: Every day | ORAL | Status: DC
  Filled 2022-03-20: qty 1

## 2022-03-20 MED ORDER — SODIUM CHLORIDE 0.9% FLUSH
3.0000 mL | Freq: Two times a day (BID) | INTRAVENOUS | Status: DC
  Administered 2022-03-20 – 2022-03-21 (×4): 3 mL via INTRAVENOUS

## 2022-03-20 MED ORDER — POLYETHYLENE GLYCOL 3350 17 G PO PACK: 17.0000 g | PACK | Freq: Every day | ORAL | Status: DC | PRN

## 2022-03-20 MED ORDER — BISACODYL 5 MG PO TBEC: 5.0000 mg | DELAYED_RELEASE_TABLET | Freq: Every day | ORAL | Status: DC | PRN

## 2022-03-20 MED ORDER — HEPARIN BOLUS VIA INFUSION
2500.0000 [IU] | Freq: Once | INTRAVENOUS | Status: AC
  Administered 2022-03-20: 2500 [IU] via INTRAVENOUS
  Filled 2022-03-20: qty 2500

## 2022-03-20 MED ORDER — SODIUM CHLORIDE 0.9 % IV SOLN: INTRAVENOUS | Status: DC

## 2022-03-20 MED ORDER — ACETAMINOPHEN 325 MG PO TABS
650.0000 mg | ORAL_TABLET | Freq: Four times a day (QID) | ORAL | Status: DC | PRN
  Administered 2022-03-20 – 2022-03-22 (×4): 650 mg via ORAL
  Filled 2022-03-20 (×5): qty 2

## 2022-03-20 MED ORDER — HEPARIN (PORCINE) 25000 UT/250ML-% IV SOLN
2100.0000 [IU]/h | INTRAVENOUS | Status: DC
  Administered 2022-03-20: 1300 [IU]/h via INTRAVENOUS
  Administered 2022-03-20: 1550 [IU]/h via INTRAVENOUS
  Administered 2022-03-21 – 2022-03-22 (×2): 2100 [IU]/h via INTRAVENOUS
  Filled 2022-03-20 (×5): qty 250

## 2022-03-20 MED ORDER — DOCUSATE SODIUM 100 MG PO CAPS
100.0000 mg | ORAL_CAPSULE | Freq: Two times a day (BID) | ORAL | Status: DC
  Administered 2022-03-21: 100 mg via ORAL
  Filled 2022-03-20 (×4): qty 1

## 2022-03-20 MED ORDER — NICOTINE 14 MG/24HR TD PT24: 14.0000 mg | MEDICATED_PATCH | Freq: Every day | TRANSDERMAL | Status: DC | PRN

## 2022-03-20 MED ORDER — HYDRALAZINE HCL 20 MG/ML IJ SOLN: 5.0000 mg | INTRAMUSCULAR | Status: DC | PRN

## 2022-03-20 MED ORDER — MORPHINE SULFATE (PF) 2 MG/ML IV SOLN: 2.0000 mg | INTRAVENOUS | Status: DC | PRN

## 2022-03-20 MED ORDER — ACETAMINOPHEN 650 MG RE SUPP: 650.0000 mg | Freq: Four times a day (QID) | RECTAL | Status: DC | PRN

## 2022-03-20 MED ORDER — TRAZODONE HCL 50 MG PO TABS
25.0000 mg | ORAL_TABLET | Freq: Every evening | ORAL | Status: DC | PRN
  Filled 2022-03-20: qty 1

## 2022-03-20 MED ORDER — OXYCODONE HCL 5 MG PO TABS
5.0000 mg | ORAL_TABLET | ORAL | Status: DC | PRN
  Administered 2022-03-20 – 2022-03-21 (×5): 5 mg via ORAL
  Filled 2022-03-20 (×5): qty 1

## 2022-03-20 MED ORDER — LABETALOL HCL 100 MG PO TABS
100.0000 mg | ORAL_TABLET | Freq: Two times a day (BID) | ORAL | Status: DC
  Administered 2022-03-20 – 2022-03-22 (×5): 100 mg via ORAL
  Filled 2022-03-20 (×5): qty 1

## 2022-03-20 MED ORDER — MAGNESIUM HYDROXIDE 400 MG/5ML PO SUSP: 30.0000 mL | Freq: Every day | ORAL | Status: DC | PRN

## 2022-03-20 NOTE — Progress Notes (Signed)
ANTICOAGULATION CONSULT NOTE - Initial Consult  Pharmacy Consult for Heparin  Indication: pulmonary embolus and DVT  Allergies  Allergen Reactions   Penicillins Hives    Patient Measurements: Height: 5' 7"$  (170.2 cm) Weight: 101.2 kg (223 lb) IBW/kg (Calculated) : 61.6 Heparin Dosing Weight: 84.2 kg   Vital Signs: Temp: 98.5 F (36.9 C) (02/14 1724) BP: 138/79 (02/14 1724) Pulse Rate: 90 (02/14 1724)  Labs: Recent Labs    03/18/22 1814 03/20/22 0211 03/20/22 0520 03/20/22 1210 03/20/22 1931  HGB 12.1 11.8*  --   --   --   HCT 38.6 37.1  --   --   --   PLT 305 308  --   --   --   APTT  --   --  38* 38* 44*  LABPROT  --   --  14.6  --   --   INR  --   --  1.2  --   --   HEPARINUNFRC  --   --  >1.10*  --   --   CREATININE 0.71 0.73  --   --   --   TROPONINIHS  --  <2  --   --   --      Estimated Creatinine Clearance: 125.6 mL/min (by C-G formula based on SCr of 0.73 mg/dL).   Medical History: Past Medical History:  Diagnosis Date   Acid reflux    on no RX at present   Hemorrhoid 11/2015   starting on past Sat. Pt states it is on the outside   Tobacco abuse     Medications:  Medications Prior to Admission  Medication Sig Dispense Refill Last Dose   APIXABAN (ELIQUIS) VTE STARTER PACK (10MG AND 5MG) Take as directed on package: start with two-28m tablets twice daily for 7 days. On day 8, switch to one-566mtablet twice daily. 1 each 0 03/19/2022 at 2000   ferrous sulfate 325 (65 FE) MG tablet Take 325 mg by mouth daily with breakfast.   Past Week at Unknown   labetalol (NORMODYNE) 100 MG tablet Take 1 tablet (100 mg total) by mouth 2 (two) times daily. 30 tablet 1 03/19/2022 at 2000    Assessment: Pharmacy consulted to dose heparin in this 89ear old female who was started on Eliquis on 2/12 for DVT, now presents to ED for PE.   Pt has been taking Eliquis as prescribed.   CrCl = 125.6 ml/min   Goal of Therapy:  Heparin level 0.3-0.7 units/ml aPTT 66 -  102  seconds Monitor platelets by anticoagulation protocol: Yes   2/14 @ 1210 aPTT 38 sec, subtherapeutic @ 1300 u/hr, rate chg to 1550 2/14 1931 aPTT 44,  Subthera , inc to 1800 u/hr  Plan:  -Give heparin 2500 units IV x 1 -Increase heparin drip rate to 1800 units/hr -Recheck aPTT in 6 hours after rate change -Will use aPTT to guide dosing until HL and aPTT correlate. - Will check HL on 2/15 with AM Labs.  -Daily CBC while on heparin  Reyan Helle A, PharmD 03/20/2022,8:07 PM

## 2022-03-20 NOTE — ED Provider Notes (Signed)
[facility] Provider Note    Event Date/Time   First MD Initiated Contact with Patient 03/20/22 0330     (approximate)   History   Chest Pain   HPI  Jasmine Gray is a 32 y.o. female with gestational hypertension, recent C-section on 03/03/2022 at 15 and 2 with Dr. Amalia Hailey secondary to intrauterine fetal demise and large placental abruption, DIC requiring transfusion who presents to the emergency department complaints of right-sided chest pain worse with deep inspiration.  No fevers, cough or shortness of breath.  Patient was just diagnosed with a DVT in her right leg 2 days ago and was started on Eliquis.  Denies any abdominal pain, vomiting.  C-section scar is well-healed and vaginal bleeding has stopped.  No history of preeclampsia.   History provided by patient.    Past Medical History:  Diagnosis Date   Acid reflux    on no RX at present   Hemorrhoid 11/2015   starting on past Sat. Pt states it is on the outside   Tobacco abuse     Past Surgical History:  Procedure Laterality Date   CESAREAN SECTION  03/03/2022   Procedure: CESAREAN SECTION;  Surgeon: Harlin Heys, MD;  Location: [facility] ORS;  Service: Obstetrics;;   NO PAST SURGERIES      MEDICATIONS:  Prior to Admission medications   Medication Sig Start Date End Date Taking? Authorizing Provider  APIXABAN (ELIQUIS) VTE STARTER PACK (10MG AND 5MG) Take as directed on package: start with two-30m tablets twice daily for 7 days. On day 8, switch to one-549mtablet twice daily. 03/18/22   SiTeodoro SprayPA  labetalol (NORMODYNE) 100 MG tablet Take 1 tablet (100 mg total) by mouth 2 (two) times daily. 03/11/22   Dominic, LyNunzio CobbsCNM  Prenatal Vit-Fe Fumarate-FA (PRENATAL VITAMINS) 28-0.8 MG TABS Take 1 tablet by mouth daily.    [provider]  albuterol (VENTOLIN HFA) 108 (90 Base) MCG/ACT inhaler Inhale 2 puffs into the lungs every 6 (six) hours as needed for wheezing or  shortness of breath. 02/14/20 04/22/20  HaSharion BalloonFNP  fluticasone (FLONASE) 50 MCG/ACT nasal spray Place 2 sprays into both nostrils daily. 02/14/20 04/22/20  HaSharion BalloonFNP  metoprolol tartrate (LOPRESSOR) 25 MG tablet Take 0.5 tablets (12.5 mg total) by mouth 2 (two) times daily as needed (Palpitations). Patient not taking: Reported on 07/08/2019 03/29/19 04/22/20  IsDuffy BruceMD  omeprazole (PRILOSEC) 20 MG capsule Take 1 capsule (20 mg total) by mouth daily. 07/17/19 04/22/20  McVey, ElGelene MinkPA-C    Physical Exam   Triage Vital Signs: ED Triage Vitals [03/20/22 0210]  Enc Vitals Group     BP (!) 146/95     Pulse Rate 94     Resp 20     Temp 98.4 F (36.9 C)     Temp Source Oral     SpO2 100 %     Weight 223 lb (101.2 kg)     Height 5' 7"$  (1.702 m)     Head Circumference      Peak Flow      Pain Score 5     Pain Loc      Pain Edu?      Excl. in [place]    Most recent vital signs: Vitals:   03/20/22 0210  BP: (!) 146/95  Pulse: 94  Resp: 20  Temp: 98.4 F (36.9 C)  SpO2: 100%  CONSTITUTIONAL: Alert, responds appropriately to questions. Well-appearing; well-nourished, patient is appropriately tearful HEAD: Normocephalic, atraumatic EYES: Conjunctivae clear, pupils appear equal, sclera nonicteric ENT: normal nose; moist mucous membranes NECK: Supple, normal ROM CARD: RRR; S1 and S2 appreciated RESP: Normal chest excursion without splinting or tachypnea; breath sounds clear and equal bilaterally; no wheezes, no rhonchi, no rales, no hypoxia or respiratory distress, speaking full sentences ABD/GI: Non-distended; soft, non-tender, no rebound, no guarding, no peritoneal signs, no tenderness in the right upper quadrant, C-section scar is well-healed without surrounding redness or warmth BACK: The back appears normal EXT: Normal ROM in all joints; no deformity noted, no edema SKIN: Normal color for age and race; warm; no rash on exposed skin NEURO:  Moves all extremities equally, normal speech PSYCH: The patient's mood and manner are appropriate.   ED Results / Procedures / Treatments   LABS: (all labs ordered are listed, but only abnormal results are displayed) Labs Reviewed  CBC WITH DIFFERENTIAL/PLATELET - Abnormal; Notable for the following components:      Result Value   Hemoglobin 11.8 (*)    All other components within normal limits  COMPREHENSIVE METABOLIC PANEL - Abnormal; Notable for the following components:   Potassium 3.4 (*)    AST 14 (*)    All other components within normal limits  RESP PANEL BY RT-PCR (RSV, FLU A&B, COVID)  RVPGX2  LIPASE, BLOOD  URINALYSIS, ROUTINE W REFLEX MICROSCOPIC  TROPONIN I (HIGH SENSITIVITY)     EKG:  EKG Interpretation  Date/Time:  Wednesday March 20 2022 02:13:50 EST Ventricular Rate:  103 PR Interval:  138 QRS Duration: 72 QT Interval:  332 QTC Calculation: 434 R Axis:   76 Text Interpretation: Sinus tachycardia Cannot rule out Anterior infarct (cited on or before 29-Jul-2021) Abnormal ECG When compared with ECG of 29-Jul-2021 20:39, ST no longer depressed in Inferior leads T wave inversion no longer evident in Anterior leads Confirmed by Pryor Curia [phone]) on 03/20/2022 3:28:11 AM         RADIOLOGY: My personal review and interpretation of imaging: CT showed bilateral PEs without right heart strain.  Patient does have possible pulmonary infarcts.  I have personally reviewed all radiology reports.   CT Angio Chest PE W and/or Wo Contrast  Result Date: 03/20/2022 CLINICAL DATA:  Pulmonary embolism. Status post C-section 2 weeks ago. EXAM: CT ANGIOGRAPHY CHEST WITH CONTRAST TECHNIQUE: Multidetector CT imaging of the chest was performed using the standard protocol during bolus administration of intravenous contrast. Multiplanar CT image reconstructions and MIPs were obtained to evaluate the vascular anatomy. RADIATION DOSE REDUCTION: This exam was performed according  to the departmental dose-optimization program which includes automated exposure control, adjustment of the mA and/or kV according to patient size and/or use of iterative reconstruction technique. CONTRAST:  42m OMNIPAQUE IOHEXOL 350 MG/ML SOLN COMPARISON:  None Available. FINDINGS: Cardiovascular: Satisfactory opacification of the pulmonary arteries to the segmental level. Right middle and right lower lobar pulmonary artery emboli are identified. Filling defects noted within the right middle lobe, right lower and left lower lobe segmental pulmonary arteries. The RV to LV ratio is calculated to be 0.86. Heart size is normal.  No pericardial effusion. Mediastinum/Nodes: No enlarged mediastinal, hilar, or axillary lymph nodes. Thyroid gland, trachea, and esophagus demonstrate no significant findings. Lungs/Pleura: No pleural fluid. Mild mosaic attenuation within the lower lung zones. Patchy ground-glass and airspace densities are noted within the periphery of the right middle lobe and both lower lobes. This is most severe within  the posterior right base, image 105/5. 4 mm left lower lobe lung nodule is identified, image 75/5. Upper Abdomen: No acute abnormality. Multiple gallstones are noted which measure up to 1.6 cm. Musculoskeletal: No chest wall abnormality. No acute or significant osseous findings. Review of the MIP images confirms the above findings. IMPRESSION: 1. Examination is positive for bilateral pulmonary emboli. No signs of right heart strain. The RV to LV ratio is equal to 0.86. 2. Patchy ground-glass and airspace densities are noted within the periphery of the right middle lobe and both lower lobes. This is most severe within the posterior right base. Findings are nonspecific and may reflect areas of pulmonary infarct versus underlying infectious or inflammatory process. 3. Gallstones. Critical Value/emergent results were called by telephone at the time of interpretation on 03/20/2022 at 4:34 am to  provider [facility] , who verbally acknowledged these results. Electronically Signed   By: Kerby Moors M.D.   On: 03/20/2022 04:36   DG Chest 1 View  Result Date: 03/20/2022 CLINICAL DATA:  Chest pain. EXAM: CHEST  1 VIEW COMPARISON:  April 22, 2020 FINDINGS: The heart size and mediastinal contours are within normal limits. Both lungs are clear. The visualized skeletal structures are unremarkable. IMPRESSION: No active disease. Electronically Signed   By: Virgina Norfolk M.D.   On: 03/20/2022 02:32     PROCEDURES:  Critical Care performed: Yes, see critical care procedure note(s)   CRITICAL CARE Performed by: Cyril Mourning Jezlyn Westerfield   Total critical care time: 45 minutes  Critical care time was exclusive of separately billable procedures and treating other patients.  Critical care was necessary to treat or prevent imminent or life-threatening deterioration.  Critical care was time spent personally by me on the following activities: development of treatment plan with patient and/or surrogate as well as nursing, discussions with consultants, evaluation of patient's response to treatment, examination of patient, obtaining history from patient or surrogate, ordering and performing treatments and interventions, ordering and review of laboratory studies, ordering and review of radiographic studies, pulse oximetry and re-evaluation of patient's condition.   Marland Kitchen1-3 Lead EKG Interpretation  Performed by: Adenike Shidler, Delice Bison, DO Authorized by: Adeli Frost, Delice Bison, DO     Interpretation: normal     ECG rate:  94   ECG rate assessment: normal     Rhythm: sinus rhythm     Ectopy: none     Conduction: normal       IMPRESSION / MDM / ASSESSMENT AND PLAN / ED COURSE  I reviewed the triage vital signs and the nursing notes.    Patient here with right-sided chest pain that is pleuritic in nature after recent diagnosis of DVT in the setting of recent pregnancy and delivery.  The patient is on the  cardiac monitor to evaluate for evidence of arrhythmia and/or significant heart rate changes.   DIFFERENTIAL DIAGNOSIS (includes but not limited to):   PE, pneumonia, pleural effusion, cholelithiasis, cholecystitis, pancreatitis, kidney stone, UTI, low suspicion for ACS   Patient's presentation is most consistent with acute presentation with potential threat to life or bodily function.   PLAN: Labs obtained from triage.  Hemoglobin of 11.8 which is stable from 12.12 days ago.  No leukocytosis.  Normal electrolytes, LFTs, lipase.  Negative troponin.  Chest x-ray reviewed and interpreted by myself and the radiologist and shows no acute abnormality.  EKG shows tachycardia without other abnormality or ischemic change.  Will obtain CTA of her chest.  She declines any pain medication at this  time.   MEDICATIONS GIVEN IN ED: Medications  iohexol (OMNIPAQUE) 350 MG/ML injection 75 mL (75 mLs Intravenous Contrast Given 03/20/22 0354)     ED COURSE: CT scan reviewed and interpreted by myself and the radiologist and shows bilateral PEs without right heart strain.  She does have what appears to be pulmonary infarct versus opacity secondary to infection.  She denies any fevers or cough.  No leukocytosis.  Will check COVID, flu and RSV.  Will start heparin.  Patient agrees to admission.   CONSULTS:  Consulted and discussed patient's case with hospitalist, Dr. Sidney Ace.  I have recommended admission and consulting physician agrees and will place admission orders.  Patient (and family if present) agree with this plan.   I reviewed all nursing notes, vitals, pertinent previous records.  All labs, EKGs, imaging ordered have been independently reviewed and interpreted by myself.    OUTSIDE RECORDS REVIEWED: Reviewed patient's recent OB notes.       FINAL CLINICAL IMPRESSION(S) / ED DIAGNOSES   Final diagnoses:  Right-sided chest pain     Rx / DC Orders   ED Discharge Orders     None         Note:  This document was prepared using Dragon voice recognition software and may include unintentional dictation errors.   Kylin Dubs, Delice Bison, DO 03/20/22 [phone]

## 2022-03-20 NOTE — Progress Notes (Signed)
*  PRELIMINARY RESULTS* Echocardiogram Limited 2D Echocardiogram has been performed.  Jasmine Gray 03/20/2022, 3:56 PM

## 2022-03-20 NOTE — H&P (Signed)
History and Physical    Patient: Jasmine Gray [id number] DOB: December 04, 1990 DOA: 03/20/2022 DOS: the patient was seen and examined on 03/20/2022 PCP: Pcp, No  Patient coming from: Home - lives with 2 children (8, 6); NOK: Mother, [phone]   Chief Complaint: chest pain  HPI: Jasmine Gray is a 32 y.o. female with medical history significant of recent IUFD s/p LTCS at [redacted] weeks gestation (1/28) presenting with chest pain after recent diagnosis of RLE DVT on 2/12 despite Eliquis.   She had a C-section 2 weeks ago.  She developed a blood clot int he back of her leg.  She came the hospital for this on 2/12.  She started having pain on the R back when deep inspiration starting last night about 8pm.  No cough.  No SOB.      ER Course:  Carryover, per Dr. Sidney Ace:  Recent pregnancy and C-section for intrauterine fetal demise at the end of January. Here in the ED 2 days ago and diagnosed with a right lower extremity DVT and has been on Eliquis for 2 days. Not having right-sided chest pain. CT scan shows bilateral PE with possible pulmonary infarcts but no right heart strain. No hypoxia. Normal blood pressures.  IV heparin was started.      Review of Systems: As mentioned in the history of present illness. All other systems reviewed and are negative. Past Medical History:  Diagnosis Date   Acid reflux    on no RX at present   Hemorrhoid 11/2015   starting on past Sat. Pt states it is on the outside   Tobacco abuse    Past Surgical History:  Procedure Laterality Date   CESAREAN SECTION  03/03/2022   Procedure: CESAREAN SECTION;  Surgeon: Harlin Heys, MD;  Location: [facility] ORS;  Service: Obstetrics;;   Social History:  reports that she has been smoking cigarettes. She has a 6.00 pack-year smoking history. She has never used smokeless tobacco. She reports that she does not drink alcohol and does not use drugs.  Allergies  Allergen Reactions   Penicillins Hives    Family History   Problem Relation Age of Onset   Hypertension Mother    Migraines Mother    Heart disease Father    Hypertension Father    Congestive Heart Failure Father    Stroke Father    Factor V Leiden deficiency Father    Cancer Maternal Grandmother 57       breast   Alcohol abuse Maternal Grandfather    Hypertension Paternal Grandfather    Depression Neg Hx     Prior to Admission medications   Medication Sig Start Date End Date Taking? Authorizing Provider  APIXABAN (ELIQUIS) VTE STARTER PACK (10MG AND 5MG) Take as directed on package: start with two-4m tablets twice daily for 7 days. On day 8, switch to one-527mtablet twice daily. 03/18/22   SiTeodoro SprayPA  labetalol (NORMODYNE) 100 MG tablet Take 1 tablet (100 mg total) by mouth 2 (two) times daily. 03/11/22   Dominic, LyNunzio CobbsCNM  Prenatal Vit-Fe Fumarate-FA (PRENATAL VITAMINS) 28-0.8 MG TABS Take 1 tablet by mouth daily.    [provider]  albuterol (VENTOLIN HFA) 108 (90 Base) MCG/ACT inhaler Inhale 2 puffs into the lungs every 6 (six) hours as needed for wheezing or shortness of breath. 02/14/20 04/22/20  HaSharion BalloonFNP  fluticasone (FLONASE) 50 MCG/ACT nasal spray Place 2 sprays into both nostrils daily. 02/14/20 04/22/20  Hawks,  Christy A, FNP  metoprolol tartrate (LOPRESSOR) 25 MG tablet Take 0.5 tablets (12.5 mg total) by mouth 2 (two) times daily as needed (Palpitations). Patient not taking: Reported on 07/08/2019 03/29/19 04/22/20  Duffy Bruce, MD  omeprazole (PRILOSEC) 20 MG capsule Take 1 capsule (20 mg total) by mouth daily. 07/17/19 04/22/20  McVey, Gelene Mink, PA-C    Physical Exam: Vitals:   03/20/22 0700 03/20/22 0900 03/20/22 1100 03/20/22 1200  BP: 121/75 (!) 132/91 118/78 129/82  Pulse: 76 97 96 69  Resp: 17 18  16  $ Temp:  98.3 F (36.8 C)  98.4 F (36.9 C)  TempSrc:      SpO2: 100% 100% 100% 98%  Weight:      Height:       General:  Appears calm and comfortable and is in  NAD Eyes:  EOMI, normal lids, iris ENT:  grossly normal hearing, lips & tongue, mmm Neck:  no LAD, masses or thyromegaly Cardiovascular:  RRR, no m/r/g. No LE edema.  Respiratory:   CTA bilaterally with no wheezes/rales/rhonchi.  Normal respiratory effort. Abdomen:  soft, NT, ND, NABS Back:   normal alignment, no CVAT Skin:  no rash or induration seen on limited exam Musculoskeletal:  grossly normal tone BUE/BLE, good ROM, no bony abnormality, compression stockings in place on BLE Psychiatric:  blunted mood and affect, speech fluent and appropriate, AOx3 Neurologic:  CN 2-12 grossly intact, moves all extremities in coordinated fashion   Radiological Exams on Admission: Independently reviewed - see discussion in A/P where applicable  CT Angio Chest PE W and/or Wo Contrast  Result Date: 03/20/2022 CLINICAL DATA:  Pulmonary embolism. Status post C-section 2 weeks ago. EXAM: CT ANGIOGRAPHY CHEST WITH CONTRAST TECHNIQUE: Multidetector CT imaging of the chest was performed using the standard protocol during bolus administration of intravenous contrast. Multiplanar CT image reconstructions and MIPs were obtained to evaluate the vascular anatomy. RADIATION DOSE REDUCTION: This exam was performed according to the departmental dose-optimization program which includes automated exposure control, adjustment of the mA and/or kV according to patient size and/or use of iterative reconstruction technique. CONTRAST:  48m OMNIPAQUE IOHEXOL 350 MG/ML SOLN COMPARISON:  None Available. FINDINGS: Cardiovascular: Satisfactory opacification of the pulmonary arteries to the segmental level. Right middle and right lower lobar pulmonary artery emboli are identified. Filling defects noted within the right middle lobe, right lower and left lower lobe segmental pulmonary arteries. The RV to LV ratio is calculated to be 0.86. Heart size is normal.  No pericardial effusion. Mediastinum/Nodes: No enlarged mediastinal, hilar, or  axillary lymph nodes. Thyroid gland, trachea, and esophagus demonstrate no significant findings. Lungs/Pleura: No pleural fluid. Mild mosaic attenuation within the lower lung zones. Patchy ground-glass and airspace densities are noted within the periphery of the right middle lobe and both lower lobes. This is most severe within the posterior right base, image 105/5. 4 mm left lower lobe lung nodule is identified, image 75/5. Upper Abdomen: No acute abnormality. Multiple gallstones are noted which measure up to 1.6 cm. Musculoskeletal: No chest wall abnormality. No acute or significant osseous findings. Review of the MIP images confirms the above findings. IMPRESSION: 1. Examination is positive for bilateral pulmonary emboli. No signs of right heart strain. The RV to LV ratio is equal to 0.86. 2. Patchy ground-glass and airspace densities are noted within the periphery of the right middle lobe and both lower lobes. This is most severe within the posterior right base. Findings are nonspecific and may reflect areas of pulmonary infarct  versus underlying infectious or inflammatory process. 3. Gallstones. Critical Value/emergent results were called by telephone at the time of interpretation on 03/20/2022 at 4:34 am to provider [facility] , who verbally acknowledged these results. Electronically Signed   By: Kerby Moors M.D.   On: 03/20/2022 04:36   DG Chest 1 View  Result Date: 03/20/2022 CLINICAL DATA:  Chest pain. EXAM: CHEST  1 VIEW COMPARISON:  April 22, 2020 FINDINGS: The heart size and mediastinal contours are within normal limits. Both lungs are clear. The visualized skeletal structures are unremarkable. IMPRESSION: No active disease. Electronically Signed   By: Virgina Norfolk M.D.   On: 03/20/2022 02:32   US Venous Img Lower Right (DVT Study)  Result Date: 03/18/2022 CLINICAL DATA:  Calf pain for 4 days EXAM: Right LOWER EXTREMITY VENOUS DOPPLER ULTRASOUND TECHNIQUE: Gray-scale sonography with  compression, as well as color and duplex ultrasound, were performed to evaluate the deep venous system(s) from the level of the common femoral vein through the popliteal and proximal calf veins. COMPARISON:  None Available. FINDINGS: VENOUS Normal compressibility of the common femoral, superficial femoral, and popliteal veins. Visualized portions of profunda femoral vein and great saphenous vein unremarkable. Doppler waveforms show normal direction of venous flow, normal respiratory plasticity and response to augmentation. However there is poor compression along veins in the calf region posteriorly consistent with below-the-knee thrombus. Limited views of the contralateral common femoral vein are unremarkable. OTHER None. Limitations: none IMPRESSION: There is no evidence of deep venous thrombosis in the right lower extremity above the knee. However there is poor compression along veins in the calf consistent with below-the-knee thrombus. Electronically Signed   By: Jill Side M.D.   On: 03/18/2022 17:39    EKG: Independently reviewed.  Sinus tachycardia with rate 103; no evidence of acute ischemia   Labs on Admission: I have personally reviewed the available labs and imaging studies at the time of the admission.  Pertinent labs:     K+ 3.4 HS troponin <2 Unremarkable CBC INR 1.2 COVID/flu/RSV negative UA: moderate Hgb   Assessment and Plan: Principal Problem:   Bilateral pulmonary embolism (HCC) Active Problems:   Tobacco user   DVT (deep venous thrombosis) (HCC)   History of IUFD   Essential hypertension    DVT/PE -Patient without prior episodes of thromboembolic disease presenting with new PE -She was diagnosed with RLE DVT on 2/12 and was given Eliquis but developed pleuritic CP overnight and was found to have PE on imaging -This is very likely related to her recent pregnancy and LTCS -Patient is not showing evidence of hemodynamic instability at this time -PESI score is Class  I, very low risk, indicating a 0-1.6% 30-day mortality risk -S-PESI score is also low, indicating that the patient has a 1.1% risk of death and a 1.5% risk of recurrent VTE or non-fatal bleeding -Will admit on telemetry -R heart strain was not seen on CT but an echo has been ordered to assess the severity of clot burden with R heart strain -Initiate anticoagulation - for now, will start treatment-dose heparin with plan to transition back to Eliquis in the next 1-2 days - O2 as needed - none currently -Smoking cessation has been strongly encouraged -Patients are at low risk (<3%/year) for recurrent VTE if initial clot occurred after major surgery or trauma -Patients are at intermediate risk (3-8%/year) for recurrent VTE if initial clot occurred after pregnancy/estrogen. -This PE can likely be treated for 6-12 months; will defer duration of  therapy to PCP  H/o IUFD -Tragically, the patient's recent pregnancy ended in demise -To add to this tragedy, the fetus was in an unstable position and this resulted in emergent LTCS -She had hemorrhage associated and was transfused 5 units PRBC, 2 units cryoprecipitate, and 2 units FFP -Her LTCS incision appears to be healing well and she is grieving and recovering appropriately  HTN -Continue labetolol  Tobacco dependence -Encourage cessation.   -This was discussed with the patient and should be reviewed on an ongoing basis, she genuinely wants to quit.   -Patch ordered prn, although the patient prefers to quit without.    Advance Care Planning:   Code Status: Full Code - Code status was discussed with the patient and/or family at the time of admission.  The patient would want to receive full resuscitative measures at this time.   Consults: None  DVT Prophylaxis: Heparin -> Eliquis  Family Communication: None present; she is capable of communicating with family at this time  Severity of Illness: The appropriate patient status for this patient  is INPATIENT. Inpatient status is judged to be reasonable and necessary in order to provide the required intensity of service to ensure the patient's safety. The patient's presenting symptoms, physical exam findings, and initial radiographic and laboratory data in the context of their chronic comorbidities is felt to place them at high risk for further clinical deterioration. Furthermore, it is not anticipated that the patient will be medically stable for discharge from the hospital within 2 midnights of admission.   * I certify that at the point of admission it is my clinical judgment that the patient will require inpatient hospital care spanning beyond 2 midnights from the point of admission due to high intensity of service, high risk for further deterioration and high frequency of surveillance required.*  Author: Karmen Bongo, MD 03/20/2022 12:30 PM  For on call review [email or web address].

## 2022-03-20 NOTE — Progress Notes (Signed)
ANTICOAGULATION CONSULT NOTE - Initial Consult  Pharmacy Consult for Heparin  Indication: pulmonary embolus and DVT  Allergies  Allergen Reactions   Penicillins Hives    Patient Measurements: Height: 5' 7"$  (170.2 cm) Weight: 101.2 kg (223 lb) IBW/kg (Calculated) : 61.6 Heparin Dosing Weight: 84.2 kg   Vital Signs: Temp: 98.4 F (36.9 C) (02/14 1200) Temp Source: Oral (02/14 0210) BP: 129/82 (02/14 1200) Pulse Rate: 69 (02/14 1200)  Labs: Recent Labs    03/18/22 1814 03/20/22 0211 03/20/22 0520 03/20/22 1210  HGB 12.1 11.8*  --   --   HCT 38.6 37.1  --   --   PLT 305 308  --   --   APTT  --   --  38* 38*  LABPROT  --   --  14.6  --   INR  --   --  1.2  --   HEPARINUNFRC  --   --  >1.10*  --   CREATININE 0.71 0.73  --   --   TROPONINIHS  --  <2  --   --      Estimated Creatinine Clearance: 125.6 mL/min (by C-G formula based on SCr of 0.73 mg/dL).   Medical History: Past Medical History:  Diagnosis Date   Acid reflux    on no RX at present   Hemorrhoid 11/2015   starting on past Sat. Pt states it is on the outside   Tobacco abuse     Medications:  (Not in a hospital admission)  Assessment: Pharmacy consulted to dose heparin in this 32 year old female who was started on Eliquis on 2/12 for DVT, now presents to ED for PE.   Pt has been taking Eliquis as prescribed.   CrCl = 125.6 ml/min   Goal of Therapy:  Heparin level 0.3-0.7 units/ml aPTT 66 - 102  seconds Monitor platelets by anticoagulation protocol: Yes   2/14 @ 1210 aPTT 38 sec, subtherapeutic @ 1300 u/hr  Plan:  -Give heparin 2500 units IV x 1 -Increase heparin drip rate to 1550 units/hr -Recheck aPTT in 6 hours after rate change -Will use aPTT to guide dosing until HL and aPTT correlate. - Will check HL on 2/15 with AM Labs.  -Daily CBC while on heparin  Lorin Picket, PharmD 03/20/2022,12:58 PM

## 2022-03-20 NOTE — ED Notes (Signed)
Assumed care. Pt resting comfortably in bed at this time. Pt denies any current needs or questions. Call light with in reach.

## 2022-03-20 NOTE — ED Notes (Signed)
Pt complaining of increased pain when breathing on right side. Pt ambulated to restroom. Pt given PRN pain medication, MD Yates notified.

## 2022-03-20 NOTE — ED Triage Notes (Signed)
Pt presents to ER with c/o right sided rib/chest pain that started appx 5 hours pta.  Pt states she had c-section 2 weeks ago and was dx with a right LE DVT on 2/12.  Pt states she started eliquis on 2/12 and has been taking as prescribed.  Pt states pain is worse when breathing.  Pt is otherwise A&O x4 and in NAD at this time.

## 2022-03-20 NOTE — Progress Notes (Signed)
ANTICOAGULATION CONSULT NOTE - Initial Consult  Pharmacy Consult for Heparin  Indication: pulmonary embolus and DVT  Allergies  Allergen Reactions   Penicillins Hives    Patient Measurements: Height: 5' 7"$  (170.2 cm) Weight: 101.2 kg (223 lb) IBW/kg (Calculated) : 61.6 Heparin Dosing Weight: 84.2 kg   Vital Signs: Temp: 98.4 F (36.9 C) (02/14 0210) Temp Source: Oral (02/14 0210) BP: 146/95 (02/14 0210) Pulse Rate: 94 (02/14 0210)  Labs: Recent Labs    03/18/22 1814 03/20/22 0211  HGB 12.1 11.8*  HCT 38.6 37.1  PLT 305 308  CREATININE 0.71 0.73  TROPONINIHS  --  <2    Estimated Creatinine Clearance: 125.6 mL/min (by C-G formula based on SCr of 0.73 mg/dL).   Medical History: Past Medical History:  Diagnosis Date   Acid reflux    on no RX at present   Hemorrhoid 11/2015   starting on past Sat. Pt states it is on the outside   Tobacco abuse     Medications:  (Not in a hospital admission)   Assessment: Pharmacy consulted to dose heparin in this 32 year old female who was started on Eliquis on 2/12 for DVT, now presents to ED for PE.   Pt has been taking Eliquis as prescribed.   CrCl = 125.6 ml/min   Goal of Therapy:  Heparin level 0.3-0.7 units/ml aPTT 66 - 102  seconds Monitor platelets by anticoagulation protocol: Yes   Plan:  Give 4000 units bolus x 1 Start heparin infusion at 1300 units/hr Continue to monitor H&H and platelets  - Will use aPTT to guide dosing until HL and aPTT correlate. - Will draw aPTT 6 hrs after start of drip - Will check HL on 2/15 with AM Labs.   Sheretha Shadd D 03/20/2022,5:12 AM

## 2022-03-21 DIAGNOSIS — I2699 Other pulmonary embolism without acute cor pulmonale: Secondary | ICD-10-CM | POA: Diagnosis not present

## 2022-03-21 LAB — ECHOCARDIOGRAM LIMITED
Height: 67 in
S' Lateral: 3.2 cm
Weight: 3568 oz

## 2022-03-21 LAB — BASIC METABOLIC PANEL
Anion gap: 10 (ref 5–15)
BUN: 8 mg/dL (ref 6–20)
CO2: 22 mmol/L (ref 22–32)
Calcium: 9 mg/dL (ref 8.9–10.3)
Chloride: 103 mmol/L (ref 98–111)
Creatinine, Ser: 0.73 mg/dL (ref 0.44–1.00)
GFR, Estimated: >60 mL/min (ref >60)
Glucose, Bld: 91 mg/dL (ref 70–99)
Potassium: 3.8 mmol/L (ref 3.5–5.1)
Sodium: 135 mmol/L (ref 135–145)

## 2022-03-21 LAB — CBC
HCT: 33.9 % — ABNORMAL LOW (ref 36.0–46.0)
Hemoglobin: 10.7 g/dL — ABNORMAL LOW (ref 12.0–15.0)
MCH: 29.7 pg (ref 26.0–34.0)
MCHC: 31.6 g/dL (ref 30.0–36.0)
MCV: 94.2 fL (ref 80.0–100.0)
Platelets: 264 10*3/uL (ref 150–400)
RBC: 3.6 MIL/uL — ABNORMAL LOW (ref 3.87–5.11)
RDW: 15.1 % (ref 11.5–15.5)
WBC: 6.6 10*3/uL (ref 4.0–10.5)
nRBC: 0 % (ref 0.0–0.2)

## 2022-03-21 LAB — HEPARIN LEVEL (UNFRACTIONATED): Heparin Unfractionated: 0.4 IU/mL (ref 0.30–0.70)

## 2022-03-21 LAB — APTT
aPTT: 56 seconds — ABNORMAL HIGH (ref 24–36)
aPTT: 61 seconds — ABNORMAL HIGH (ref 24–36)
aPTT: 78 seconds — ABNORMAL HIGH (ref 24–36)

## 2022-03-21 MED ORDER — HEPARIN BOLUS VIA INFUSION
1250.0000 [IU] | Freq: Once | INTRAVENOUS | Status: AC
  Administered 2022-03-21: 1250 [IU] via INTRAVENOUS
  Filled 2022-03-21: qty 1250

## 2022-03-21 NOTE — Progress Notes (Signed)
ANTICOAGULATION CONSULT NOTE - Initial Consult  Pharmacy Consult for Heparin  Indication: pulmonary embolus and DVT  Allergies  Allergen Reactions   Penicillins Hives    Patient Measurements: Height: 5' 7"$  (170.2 cm) Weight: 101.2 kg (223 lb) IBW/kg (Calculated) : 61.6 Heparin Dosing Weight: 84.2 kg   Vital Signs: Temp: 100.2 F (37.9 C) (02/15 1247) Temp Source: Oral (02/15 1247) BP: 121/77 (02/15 1247) Pulse Rate: 95 (02/15 1247)  Labs: Recent Labs    03/18/22 1814 03/20/22 0211 03/20/22 0520 03/20/22 1210 03/20/22 1931 03/21/22 0323 03/21/22 1317  HGB 12.1 11.8*  --   --   --  10.7*  --   HCT 38.6 37.1  --   --   --  33.9*  --   PLT 305 308  --   --   --  264  --   APTT  --   --  38*   < > 44* 56* 61*  LABPROT  --   --  14.6  --   --   --   --   INR  --   --  1.2  --   --   --   --   HEPARINUNFRC  --   --  >1.10*  --   --  0.40  --   CREATININE 0.71 0.73  --   --   --  0.73  --   TROPONINIHS  --  <2  --   --   --   --   --    < > = values in this interval not displayed.     Estimated Creatinine Clearance: 125.6 mL/min (by C-G formula based on SCr of 0.73 mg/dL).   Medical History: Past Medical History:  Diagnosis Date   Acid reflux    on no RX at present   Hemorrhoid 11/2015   starting on past Sat. Pt states it is on the outside   Tobacco abuse     Medications:  Medications Prior to Admission  Medication Sig Dispense Refill Last Dose   APIXABAN (ELIQUIS) VTE STARTER PACK (10MG AND 5MG) Take as directed on package: start with two-67m tablets twice daily for 7 days. On day 8, switch to one-547mtablet twice daily. 1 each 0 03/19/2022 at 2000   ferrous sulfate 325 (65 FE) MG tablet Take 325 mg by mouth daily with breakfast.   Past Week at Unknown   labetalol (NORMODYNE) 100 MG tablet Take 1 tablet (100 mg total) by mouth 2 (two) times daily. 30 tablet 1 03/19/2022 at 2000    Assessment: Pharmacy consulted to dose heparin in this [age]ear old female who  was started on Eliquis on 2/12 for DVT, now presents to ED for PE.   Pt has been taking Eliquis as prescribed.   CrCl = 125.6 ml/min   Goal of Therapy:  Heparin level 0.3-0.7 units/ml aPTT 66 - 102  seconds Monitor platelets by anticoagulation protocol: Yes   2/14 @ 1210 aPTT 38 sec, subtherapeutic @ 1300 u/hr, rate chg to 1550 2/14 1931 aPTT 44,  Subthera , inc to 1800 u/hr 2/15 0323 aPTT 56,  HL 0.40,   SUBtherap, >> 1950 units/hr  Plan:  2/15 @ 1317:  aPTT = 61 sec  - aPTT is SUBtherapeutic - Will order heparin 1250 units IV X 1 and increase drip rate to 2100 units/hr.  - Will recheck aPTT in 6 hours after rate change - Will use aPTT to guide dosing until HL and aPTT  correlate. - Will check HL on 2/16 with AM Labs.  -Daily CBC while on heparin  Pearla Dubonnet, PharmD 03/21/2022,3:26 PM

## 2022-03-21 NOTE — Plan of Care (Signed)
  Problem: Education: Goal: Knowledge of the prescribed therapeutic regimen will improve Outcome: Progressing   Problem: Skin Integrity: Goal: Demonstration of wound healing without infection will improve Outcome: Progressing   Problem: Education: Goal: Knowledge of Childbirth will improve Outcome: Progressing   Problem: Coping: Goal: Ability to verbalize concerns and feelings about labor and delivery will improve Outcome: Progressing   Problem: Pain Management: Goal: Relief or control of pain from uterine contractions will improve Outcome: Progressing   Problem: Activity: Goal: Will verbalize the importance of balancing activity with adequate rest periods Outcome: Progressing

## 2022-03-21 NOTE — Progress Notes (Signed)
ANTICOAGULATION CONSULT NOTE - Initial Consult  Pharmacy Consult for Heparin  Indication: pulmonary embolus and DVT  Allergies  Allergen Reactions   Penicillins Hives    Patient Measurements: Height: 5' 7"$  (170.2 cm) Weight: 101.2 kg (223 lb) IBW/kg (Calculated) : 61.6 Heparin Dosing Weight: 84.2 kg   Vital Signs: Temp: 98.5 F (36.9 C) (02/15 1641) Temp Source: Oral (02/15 1641) BP: 118/65 (02/15 1938) Pulse Rate: 92 (02/15 1938)  Labs: Recent Labs    03/20/22 0211 03/20/22 0520 03/20/22 1210 03/21/22 0323 03/21/22 1317 03/21/22 2031  HGB 11.8*  --   --  10.7*  --   --   HCT 37.1  --   --  33.9*  --   --   PLT 308  --   --  264  --   --   APTT  --  38*   < > 56* 61* 78*  LABPROT  --  14.6  --   --   --   --   INR  --  1.2  --   --   --   --   HEPARINUNFRC  --  >1.10*  --  0.40  --   --   CREATININE 0.73  --   --  0.73  --   --   TROPONINIHS <2  --   --   --   --   --    < > = values in this interval not displayed.     Estimated Creatinine Clearance: 125.6 mL/min (by C-G formula based on SCr of 0.73 mg/dL).   Medical History: Past Medical History:  Diagnosis Date   Acid reflux    on no RX at present   Hemorrhoid 11/2015   starting on past Sat. Pt states it is on the outside   Tobacco abuse     Medications:  Medications Prior to Admission  Medication Sig Dispense Refill Last Dose   APIXABAN (ELIQUIS) VTE STARTER PACK (10MG AND 5MG) Take as directed on package: start with two-35m tablets twice daily for 7 days. On day 8, switch to one-577mtablet twice daily. 1 each 0 03/19/2022 at 2000   ferrous sulfate 325 (65 FE) MG tablet Take 325 mg by mouth daily with breakfast.   Past Week at Unknown   labetalol (NORMODYNE) 100 MG tablet Take 1 tablet (100 mg total) by mouth 2 (two) times daily. 30 tablet 1 03/19/2022 at 2000    Assessment: Pharmacy consulted to dose heparin in this [age]ear old female who was started on Eliquis on 2/12 for DVT, now presents to ED  for PE.   Pt has been taking Eliquis as prescribed.   CrCl = 125.6 ml/min   Goal of Therapy:  Heparin level 0.3-0.7 units/ml aPTT 66 - 102  seconds Monitor platelets by anticoagulation protocol: Yes   2/14 @ 1210 aPTT 38 sec, subtherapeutic @ 1300 u/hr, rate chg to 1550 2/14 1931 aPTT 44,  Subthera , inc to 1800 u/hr 2/15 0323 aPTT 56,  HL 0.40,   SUBtherap, >> 1950 units/hr 2/15 @ 1317:  aPTT = 61 sec  2/15 2031; aPTT 78 sec, therapeutic x 1  Plan:  Heparin therapeutic x 1 - continue heparin drip rate at 2100 units/hr.  - Will recheck aPTT in 6 hours to confirm - Will use aPTT to guide dosing until HL and aPTT correlate. - Will check HL on 2/16 with AM Labs.  -Daily CBC while on heparin  CaDorothe PeaPharmD, BCPS  Clinical Pharmacist   03/21/2022,9:27 PM

## 2022-03-21 NOTE — Progress Notes (Signed)
PROGRESS NOTE    JELESSA JURKOVICH  [id number] DOB: 12/31/1990 DOA: 03/20/2022 PCP: Pcp, No    Brief Narrative:  This 32 years old female with PMH significant for recent IUFD, s/p LTCS at [redacted] weeks gestation (1/28) presented in the ED with chest pain after recent diagnosis of RLE DVT on 03/18/22 despite Eliquis.  She had a C-section 2 weeks ago. She has developed blood clot in her right leg. She came to the hospital for this on 03/18/2022. She was started on Eliquis and discharged.  She has developed pain in the right back when taking deep breath last night.  CTA chest shows bilateral pulmonary embolism with possible pulmonary infarct but no right heart strain.  Patient is not hypoxic.  Patient started on IV heparin.  She was admitted.  Echocardiogram is pending  Assessment & Plan:   Principal Problem:   Bilateral pulmonary embolism (HCC) Active Problems:   Tobacco user   DVT (deep venous thrombosis) (HCC)   History of IUFD   Essential hypertension  DVT/ PE: Patient without prior episode of thromboembolic disease presented in the ED with PE. She was recently diagnosed with RLE DVT on 2/12 and was started on Eliquis. She has developed pleuritic chest pain overnight and found to have  PE on imaging. This is likely related to her recent pregnancy and LTCS. Continue IV heparin.  Obtain echocardiogram. Patient remains without oxygen and reports still having pain in the right back. Obtain 2D echocardiogram. Plan to transition to Eliquis before discharge.  History of IUFD: Patient had recent pregnancy ended in fetal demise.   She had hemorrhage associated and was transfused 5 units PRBC.   Hemoglobin remained stable. Follow-up outpatient OB/GYN  Essential hypertension: Continue labetalol  Tobacco dependence: Counseled about Smoking cessation.   DVT prophylaxis: Heparin Code Status: Full code Family Communication: No family at bed side. Disposition Plan:   Status is:  Inpatient Remains inpatient appropriate because:  Admitted for DVT and PE started on IV heparin.  Echocardiogram is pending plan to switch to Eliquis before discharge    Consultants:  None  Procedures: CTA chest  / Echo  Antimicrobials: None  Subjective: Patient was seen and examined at bedside.  Overnight events noted.   Patient reports doing better,  still reports having pain in the right side of the back.   She remains without oxygen.  Objective: Vitals:   03/20/22 1724 03/20/22 2034 03/21/22 0444 03/21/22 0834  BP: 138/79 115/74 115/72 116/63  Pulse: 90 85 94 94  Resp: 20 20 18 18  $ Temp: 98.5 F (36.9 C) 98.8 F (37.1 C) 98.8 F (37.1 C) 98.5 F (36.9 C)  TempSrc:      SpO2: 100% 100% 100% 100%  Weight:      Height:        Intake/Output Summary (Last 24 hours) at 03/21/2022 1128 Last data filed at 03/21/2022 1005 Gross per 24 hour  Intake 374.73 ml  Output --  Net 374.73 ml   Filed Weights   03/20/22 0210  Weight: 101.2 kg    Examination:  General exam: Appears comfortable, not in any acute distress. Respiratory system: Clear to auscultation. Respiratory effort normal. [id number] Cardiovascular system: S1 & S2 heard, regular rate and rhythm, no murmur. Gastrointestinal system: Abdomen is soft, non tender, non distended, BS+ Central nervous system: Alert and oriented X 3. No focal neurological deficits. Extremities:No clubbing , no cyanosis, no edema. Skin: No rashes, lesions or ulcers Psychiatry: Judgement and insight appear normal.  Mood & affect appropriate.     Data Reviewed: I have personally reviewed following labs and imaging studies  CBC: Recent Labs  Lab 03/18/22 1814 03/20/22 0211 03/21/22 0323  WBC 8.1 7.4 6.6  NEUTROABS 5.6 4.8  --   HGB 12.1 11.8* 10.7*  HCT 38.6 37.1 33.9*  MCV 94.1 93.7 94.2  PLT 305 308 [id number]   Basic Metabolic Panel: Recent Labs  Lab 03/18/22 1814 03/20/22 0211 03/21/22 0323  NA 137 137 135  K 3.7 3.4* 3.8  CL  106 104 103  CO2 22 23 22  $ GLUCOSE 101* 99 91  BUN 8 11 8  $ CREATININE 0.71 0.73 0.73  CALCIUM 9.1 9.1 9.0   GFR: Estimated Creatinine Clearance: 125.6 mL/min (by C-G formula based on SCr of 0.73 mg/dL). Liver Function Tests: Recent Labs  Lab 03/18/22 1814 03/20/22 0211  AST 14* 14*  ALT 13 12  ALKPHOS 87 92  BILITOT 0.5 0.3  PROT 7.7 8.1  ALBUMIN 4.0 4.0   Recent Labs  Lab 03/20/22 0211  LIPASE 27   No results for input(s): "AMMONIA" in the last 168 hours. Coagulation Profile: Recent Labs  Lab 03/20/22 0520  INR 1.2   Cardiac Enzymes: No results for input(s): "CKTOTAL", "CKMB", "CKMBINDEX", "TROPONINI" in the last 168 hours. BNP (last 3 results) No results for input(s): "PROBNP" in the last 8760 hours. HbA1C: No results for input(s): "HGBA1C" in the last 72 hours. CBG: No results for input(s): "GLUCAP" in the last 168 hours. Lipid Profile: No results for input(s): "CHOL", "HDL", "LDLCALC", "TRIG", "CHOLHDL", "LDLDIRECT" in the last 72 hours. Thyroid Function Tests: No results for input(s): "TSH", "T4TOTAL", "FREET4", "T3FREE", "THYROIDAB" in the last 72 hours. Anemia Panel: No results for input(s): "VITAMINB12", "FOLATE", "FERRITIN", "TIBC", "IRON", "RETICCTPCT" in the last 72 hours. Sepsis Labs: No results for input(s): "PROCALCITON", "LATICACIDVEN" in the last 168 hours.  Recent Results (from the past 240 hour(s))  Resp panel by RT-PCR (RSV, Flu A&B, Covid) Anterior Nasal Swab     Status: None   Collection Time: 03/20/22  5:20 AM   Specimen: Anterior Nasal Swab  Result Value Ref Range Status   SARS Coronavirus 2 by RT PCR NEGATIVE NEGATIVE Final    Comment: (NOTE) SARS-CoV-2 target nucleic acids are NOT DETECTED.  The SARS-CoV-2 RNA is generally detectable in upper respiratory specimens during the acute phase of infection. The lowest concentration of SARS-CoV-2 viral copies this assay can detect is 138 copies/mL. A negative result does not preclude  SARS-Cov-2 infection and should not be used as the sole basis for treatment or other patient management decisions. A negative result may occur with  improper specimen collection/handling, submission of specimen other than nasopharyngeal swab, presence of viral mutation(s) within the areas targeted by this assay, and inadequate number of viral copies(<138 copies/mL). A negative result must be combined with clinical observations, patient history, and epidemiological information. The expected result is Negative.  Fact Sheet for Patients:  EntrepreneurPulse.com.au  Fact Sheet for Healthcare Providers:  IncredibleEmployment.be  This test is no t yet approved or cleared by the [place] FDA and  has been authorized for detection and/or diagnosis of SARS-CoV-2 by FDA under an Emergency Use Authorization (EUA). This EUA will remain  in effect (meaning this test can be used) for the duration of the COVID-19 declaration under Section 564(b)(1) of the Act, 21 U.S.C.section 360bbb-3(b)(1), unless the authorization is terminated  or revoked sooner.       Influenza A by PCR NEGATIVE NEGATIVE  Final   Influenza B by PCR NEGATIVE NEGATIVE Final    Comment: (NOTE) The Xpert Xpress SARS-CoV-2/FLU/RSV plus assay is intended as an aid in the diagnosis of influenza from Nasopharyngeal swab specimens and should not be used as a sole basis for treatment. Nasal washings and aspirates are unacceptable for Xpert Xpress SARS-CoV-2/FLU/RSV testing.  Fact Sheet for Patients: EntrepreneurPulse.com.au  Fact Sheet for Healthcare Providers: IncredibleEmployment.be  This test is not yet approved or cleared by the [place] FDA and has been authorized for detection and/or diagnosis of SARS-CoV-2 by FDA under an Emergency Use Authorization (EUA). This EUA will remain in effect (meaning this test can be used) for the duration of  the COVID-19 declaration under Section 564(b)(1) of the Act, 21 U.S.C. section 360bbb-3(b)(1), unless the authorization is terminated or revoked.     Resp Syncytial Virus by PCR NEGATIVE NEGATIVE Final    Comment: (NOTE) Fact Sheet for Patients: EntrepreneurPulse.com.au  Fact Sheet for Healthcare Providers: IncredibleEmployment.be  This test is not yet approved or cleared by the [place] FDA and has been authorized for detection and/or diagnosis of SARS-CoV-2 by FDA under an Emergency Use Authorization (EUA). This EUA will remain in effect (meaning this test can be used) for the duration of the COVID-19 declaration under Section 564(b)(1) of the Act, 21 U.S.C. section 360bbb-3(b)(1), unless the authorization is terminated or revoked.  Performed at [facility], [address], [place], [place] [address]     Radiology Studies: CT Angio Chest PE W and/or Wo Contrast  Result Date: 03/20/2022 CLINICAL DATA:  Pulmonary embolism. Status post C-section 2 weeks ago. EXAM: CT ANGIOGRAPHY CHEST WITH CONTRAST TECHNIQUE: Multidetector CT imaging of the chest was performed using the standard protocol during bolus administration of intravenous contrast. Multiplanar CT image reconstructions and MIPs were obtained to evaluate the vascular anatomy. RADIATION DOSE REDUCTION: This exam was performed according to the departmental dose-optimization program which includes automated exposure control, adjustment of the mA and/or kV according to patient size and/or use of iterative reconstruction technique. CONTRAST:  5m OMNIPAQUE IOHEXOL 350 MG/ML SOLN COMPARISON:  None Available. FINDINGS: Cardiovascular: Satisfactory opacification of the pulmonary arteries to the segmental level. Right middle and right lower lobar pulmonary artery emboli are identified. Filling defects noted within the right middle lobe, right lower and left lower lobe segmental pulmonary  arteries. The RV to LV ratio is calculated to be 0.86. Heart size is normal.  No pericardial effusion. Mediastinum/Nodes: No enlarged mediastinal, hilar, or axillary lymph nodes. Thyroid gland, trachea, and esophagus demonstrate no significant findings. Lungs/Pleura: No pleural fluid. Mild mosaic attenuation within the lower lung zones. Patchy ground-glass and airspace densities are noted within the periphery of the right middle lobe and both lower lobes. This is most severe within the posterior right base, image 105/5. 4 mm left lower lobe lung nodule is identified, image 75/5. Upper Abdomen: No acute abnormality. Multiple gallstones are noted which measure up to 1.6 cm. Musculoskeletal: No chest wall abnormality. No acute or significant osseous findings. Review of the MIP images confirms the above findings. IMPRESSION: 1. Examination is positive for bilateral pulmonary emboli. No signs of right heart strain. The RV to LV ratio is equal to 0.86. 2. Patchy ground-glass and airspace densities are noted within the periphery of the right middle lobe and both lower lobes. This is most severe within the posterior right base. Findings are nonspecific and may reflect areas of pulmonary infarct versus underlying infectious or inflammatory process. 3. Gallstones. Critical Value/emergent  results were called by telephone at the time of interpretation on 03/20/2022 at 4:34 am to provider [facility] , who verbally acknowledged these results. Electronically Signed   By: Kerby Moors M.D.   On: 03/20/2022 04:36   DG Chest 1 View  Result Date: 03/20/2022 CLINICAL DATA:  Chest pain. EXAM: CHEST  1 VIEW COMPARISON:  April 22, 2020 FINDINGS: The heart size and mediastinal contours are within normal limits. Both lungs are clear. The visualized skeletal structures are unremarkable. IMPRESSION: No active disease. Electronically Signed   By: Virgina Norfolk M.D.   On: 03/20/2022 02:32    Scheduled Meds:  docusate sodium  100  mg Oral BID   labetalol  100 mg Oral BID   sodium chloride flush  3 mL Intravenous Q12H   Continuous Infusions:  heparin 1,950 Units/hr (03/21/22 0537)     LOS: 1 day    Time spent: 50 mins    Nyshaun Standage, MD [facility]   If 7PM-7AM, please contact night-coverage

## 2022-03-21 NOTE — Progress Notes (Signed)
ANTICOAGULATION CONSULT NOTE - Initial Consult  Pharmacy Consult for Heparin  Indication: pulmonary embolus and DVT  Allergies  Allergen Reactions   Penicillins Hives    Patient Measurements: Height: 5' 7"$  (170.2 cm) Weight: 101.2 kg (223 lb) IBW/kg (Calculated) : 61.6 Heparin Dosing Weight: 84.2 kg   Vital Signs: Temp: 98.8 F (37.1 C) (02/15 0444) BP: 115/72 (02/15 0444) Pulse Rate: 94 (02/15 0444)  Labs: Recent Labs    03/18/22 1814 03/20/22 0211 03/20/22 0520 03/20/22 0520 03/20/22 1210 03/20/22 1931 03/21/22 0323  HGB 12.1 11.8*  --   --   --   --  10.7*  HCT 38.6 37.1  --   --   --   --  33.9*  PLT 305 308  --   --   --   --  264  APTT  --   --  38*   < > 38* 44* 56*  LABPROT  --   --  14.6  --   --   --   --   INR  --   --  1.2  --   --   --   --   HEPARINUNFRC  --   --  >1.10*  --   --   --  0.40  CREATININE 0.71 0.73  --   --   --   --  0.73  TROPONINIHS  --  <2  --   --   --   --   --    < > = values in this interval not displayed.     Estimated Creatinine Clearance: 125.6 mL/min (by C-G formula based on SCr of 0.73 mg/dL).   Medical History: Past Medical History:  Diagnosis Date   Acid reflux    on no RX at present   Hemorrhoid 11/2015   starting on past Sat. Pt states it is on the outside   Tobacco abuse     Medications:  Medications Prior to Admission  Medication Sig Dispense Refill Last Dose   APIXABAN (ELIQUIS) VTE STARTER PACK (10MG AND 5MG) Take as directed on package: start with two-37m tablets twice daily for 7 days. On day 8, switch to one-54mtablet twice daily. 1 each 0 03/19/2022 at 2000   ferrous sulfate 325 (65 FE) MG tablet Take 325 mg by mouth daily with breakfast.   Past Week at Unknown   labetalol (NORMODYNE) 100 MG tablet Take 1 tablet (100 mg total) by mouth 2 (two) times daily. 30 tablet 1 03/19/2022 at 2000    Assessment: Pharmacy consulted to dose heparin in this 89ear old female who was started on Eliquis on 2/12  for DVT, now presents to ED for PE.   Pt has been taking Eliquis as prescribed.   CrCl = 125.6 ml/min   Goal of Therapy:  Heparin level 0.3-0.7 units/ml aPTT 66 - 102  seconds Monitor platelets by anticoagulation protocol: Yes   2/14 @ 1210 aPTT 38 sec, subtherapeutic @ 1300 u/hr, rate chg to 1550 2/14 1931 aPTT 44,  Subthera , inc to 1800 u/hr 2/15 0323 aPTT 56,  HL 0.40,   SUBtherap, >> 1950 units/hr  Plan:  2/15 @ 0323:  aPTT = 56,  HL = 0.40  - aPTT is SUBtherapeutic, not correlating with HL  - Will order heparin 1250 units IV X 1 and increase drip rate to 1950 units/hr.  --Will use aPTT to guide dosing until HL and aPTT correlate. - Will check HL on 2/16 with  AM Labs.  -Daily CBC while on heparin  Sai Zinn D, PharmD 03/21/2022,5:24 AM

## 2022-03-21 NOTE — Discharge Instructions (Signed)
Some PCP options in [place] area- not a comprehensive list  [facility] [place]- [phone] [facility] [place] [place]- [phone]  or [facility] Physician Referral Line [phone]

## 2022-03-21 NOTE — TOC CM/SW Note (Signed)
  Transition of Care [facility]) Screening Note   Patient Details  Name: FLOYE FESLER Date of Birth: 31-Jul-1990   Transition of Care [facility]) CM/SW Contact:    Gerilyn Pilgrim, LCSW Phone Number: 03/21/2022, 9:19 AM    Transition of Care Department [facility]) has reviewed patient and no TOC needs have been identified at this time. We will continue to monitor patient advancement through interdisciplinary progression rounds. If new patient transition needs arise, please place a TOC consult.

## 2022-03-22 DIAGNOSIS — I2699 Other pulmonary embolism without acute cor pulmonale: Secondary | ICD-10-CM | POA: Diagnosis not present

## 2022-03-22 LAB — APTT: aPTT: 90 seconds — ABNORMAL HIGH (ref 24–36)

## 2022-03-22 LAB — HEMOGLOBIN AND HEMATOCRIT, BLOOD
HCT: 34.7 % — ABNORMAL LOW (ref 36.0–46.0)
Hemoglobin: 11.2 g/dL — ABNORMAL LOW (ref 12.0–15.0)

## 2022-03-22 LAB — HEPARIN LEVEL (UNFRACTIONATED): Heparin Unfractionated: 0.43 IU/mL (ref 0.30–0.70)

## 2022-03-22 MED ORDER — OXYCODONE HCL 5 MG PO TABS: 5.0000 mg | ORAL_TABLET | ORAL | 0 refills | Status: AC | PRN

## 2022-03-22 MED ORDER — APIXABAN 5 MG PO TABS: ORAL_TABLET | ORAL | 6 refills | Status: DC

## 2022-03-22 MED ORDER — APIXABAN 5 MG PO TABS
10.0000 mg | ORAL_TABLET | Freq: Two times a day (BID) | ORAL | Status: DC
  Administered 2022-03-22: 10 mg via ORAL
  Filled 2022-03-22: qty 2

## 2022-03-22 MED ORDER — APIXABAN 5 MG PO TABS: 5.0000 mg | ORAL_TABLET | Freq: Two times a day (BID) | ORAL | Status: DC

## 2022-03-22 NOTE — Progress Notes (Signed)
ANTICOAGULATION CONSULT NOTE - Initial Consult  Pharmacy Consult for Heparin  Indication: pulmonary embolus and DVT  Allergies  Allergen Reactions   Penicillins Hives    Patient Measurements: Height: 5' 7"$  (170.2 cm) Weight: 101.2 kg (223 lb) IBW/kg (Calculated) : 61.6 Heparin Dosing Weight: 84.2 kg   Vital Signs: Temp: 98.3 F (36.8 C) (02/16 0011) Temp Source: Oral (02/15 1641) BP: 112/69 (02/16 0011) Pulse Rate: 87 (02/16 0011)  Labs: Recent Labs    03/20/22 0211 03/20/22 0520 03/20/22 1210 03/21/22 0323 03/21/22 1317 03/21/22 2031 03/22/22 0259  HGB 11.8*  --   --  10.7*  --   --  11.2*  HCT 37.1  --   --  33.9*  --   --  34.7*  PLT 308  --   --  264  --   --   --   APTT  --  38*   < > 56* 61* 78* 90*  LABPROT  --  14.6  --   --   --   --   --   INR  --  1.2  --   --   --   --   --   HEPARINUNFRC  --  >1.10*  --  0.40  --   --  0.43  CREATININE 0.73  --   --  0.73  --   --   --   TROPONINIHS <2  --   --   --   --   --   --    < > = values in this interval not displayed.     Estimated Creatinine Clearance: 125.6 mL/min (by C-G formula based on SCr of 0.73 mg/dL).   Medical History: Past Medical History:  Diagnosis Date   Acid reflux    on no RX at present   Hemorrhoid 11/2015   starting on past Sat. Pt states it is on the outside   Tobacco abuse     Medications:  Medications Prior to Admission  Medication Sig Dispense Refill Last Dose   APIXABAN (ELIQUIS) VTE STARTER PACK (10MG AND 5MG) Take as directed on package: start with two-7m tablets twice daily for 7 days. On day 8, switch to one-524mtablet twice daily. 1 each 0 03/19/2022 at 2000   ferrous sulfate 325 (65 FE) MG tablet Take 325 mg by mouth daily with breakfast.   Past Week at Unknown   labetalol (NORMODYNE) 100 MG tablet Take 1 tablet (100 mg total) by mouth 2 (two) times daily. 30 tablet 1 03/19/2022 at 2000    Assessment: Pharmacy consulted to dose heparin in this 89ear old female who  was started on Eliquis on 2/12 for DVT, now presents to ED for PE.   Pt has been taking Eliquis as prescribed.   CrCl = 125.6 ml/min   Goal of Therapy:  Heparin level 0.3-0.7 units/ml aPTT 66 - 102  seconds Monitor platelets by anticoagulation protocol: Yes   2/14 @ 1210 aPTT 38 sec, subtherapeutic @ 1300 u/hr, rate chg to 1550 2/14 1931 aPTT 44,  Subthera , inc to 1800 u/hr 2/15 0323 aPTT 56,  HL 0.40,   SUBtherap, >> 1950 units/hr 2/15 @ 1317:  aPTT = 61 sec  2/15 2031; aPTT 78 sec, therapeutic x 1 2/16 0259:  aPTT 90,  HL 0.43  Plan:  2/16 @ 0259:  aPTT = 90, HL = 0.43 - aPTT therapeutic X 2 and now correlating with HL  - will use HL to  guide dosing from here on - Will continue pt on current rate and recheck HL on 2/17 with     AM Labs.  - Daily CBC while on heparin  Otilia Kareem D, PharmD Clinical Pharmacist   03/22/2022,3:42 AM

## 2022-03-22 NOTE — Progress Notes (Signed)
Pt coughing up blood. Madaline Brilliant, MD notified 10:00 via secure chat. MD acknowledged  10:05. Time stamped cup given to pt to monitor amount of bleed.

## 2022-03-22 NOTE — Progress Notes (Signed)
Minimal blood speckled sputum from 0952 to current. Pt cleared for discharge.

## 2022-03-22 NOTE — Discharge Summary (Signed)
Physician Discharge Summary  Jasmine Gray [id number] DOB: 10-Apr-1990 DOA: 03/20/2022  PCP: Merryl Hacker, No  Admit date: 03/20/2022  Discharge date: 03/22/2022  Admitted From: Home  Disposition:  Home  Recommendations for Outpatient Follow-up:  Follow up with PCP in 1-2 weeks. Please obtain BMP/CBC in one week. Advised to follow-up with OB/GYN as scheduled. Advised to follow-up with Pulmonology for bilateral pulmonary embolism. Advised to take Eliquis 10 mg twice a day for 7 days followed by 5 mg twice daily for next 6 to 9 months for pulmonary embolism.  Home Health:None Equipment/Devices:None  Discharge Condition: Stable CODE STATUS: Full code Diet recommendation: Heart Healthy   Brief [name]: This 32 years old female with PMH significant for recent IUFD, s/p LTCS at [redacted] weeks gestation (1/28) presented in the ED with chest pain after recent diagnosis of RLE DVT on 03/18/22 despite Eliquis.  She had a C-section 2 weeks ago. She has developed blood clot in her right leg. She came to the hospital for this on 03/18/2022. She was started on Eliquis and discharged. She has developed pain in the right back when taking deep breath last night.  CTA chest shows bilateral pulmonary embolism with possible pulmonary infarct but no right heart strain.  Patient is not hypoxic. Patient started on IV heparin.She was admitted for further evaluation. Echocardiogram showed normal LVEF.  No right heart strain. Patient was successfully transitioned to Eliquis. Patient continued to have small amount of hemoptysis while coughing. Case discussed with pulmonology, Patient feels better and wants to be discharged.  Patient is being discharged home,  appointment with pulmonologist is made with [facility] clinic.  Patient is being discharged home.  Discharge Diagnoses:  Principal Problem:   Bilateral pulmonary embolism (HCC) Active Problems:   Tobacco user   DVT (deep venous thrombosis) (HCC)   History of  IUFD   Essential hypertension  DVT/ PE: Patient without prior episode of thromboembolic disease presented in the ED with PE. She was recently diagnosed with RLE DVT on 2/12 and was started on Eliquis. She has developed pleuritic chest pain overnight and found to have  PE on imaging. This is likely related to her recent pregnancy and LTCS. Continue IV heparin. Echo showed normal LVEF w/o right heart strain. Patient remains without oxygen and reports still having pain in the right back. She is successfully transitioned to Eliquis at discharge.   History of IUFD: Patient had recent pregnancy ended in fetal demise.   She had hemorrhage associated and was transfused 5 units PRBC.   Hemoglobin remained stable. Follow-up outpatient OB/GYN.   Essential hypertension: Continue labetalol   Tobacco dependence: Counseled about Smoking cessation.  Discharge Instructions  Discharge Instructions     Call MD for:  difficulty breathing, headache or visual disturbances   Complete by: As directed    Call MD for:  persistant dizziness or light-headedness   Complete by: As directed    Call MD for:  persistant nausea and vomiting   Complete by: As directed    Diet - low sodium heart healthy   Complete by: As directed    Diet Carb Modified   Complete by: As directed    Discharge instructions   Complete by: As directed    Advised to follow-up with primary care physician in 1 week. Advised to follow-up with OB/GYN as scheduled. Advised to follow-up with pulmonology for bilateral pulmonary embolism. Advised to take Eliquis 10 mg twice a day for 7 days followed by 5 mg twice  daily for next 6 to 9 months for pulmonary embolism.   Increase activity slowly   Complete by: As directed       Allergies as of 03/22/2022       Reactions   Penicillins Hives        Medication List     STOP taking these medications    Apixaban Starter Pack (12m and 524m Commonly known as: ELIQUIS STARTER  PACK Replaced by: apixaban 5 MG Tabs tablet       TAKE these medications    apixaban 5 MG Tabs tablet Commonly known as: ELIQUIS Advised to take Eliquis 10 mg twice a day for 7 days followed by 5 mg twice daily for next 6 to 9 months for pulmonary embolism. Replaces: Apixaban Starter Pack (1025mnd 5mg31m ferrous sulfate 325 (65 FE) MG tablet Take 325 mg by mouth daily with breakfast.   labetalol 100 MG tablet Commonly known as: NORMODYNE Take 1 tablet (100 mg total) by mouth 2 (two) times daily.   oxyCODONE 5 MG immediate release tablet Commonly known as: Oxy IR/ROXICODONE Take 1 tablet (5 mg total) by mouth every 4 (four) hours as needed for up to 3 days for moderate pain.        Follow-up Information     [facility]   Why: No PCP listed Contact information: [phone]         AlesOttie Glazier. Go on 03/29/2022.   Specialty: Pulmonary Disease Why: Office stated that Patient needs to call for follow up appt because of being a new patient Contact information: [phone]            Allergies  Allergen Reactions   Penicillins Hives    Consultations:  None   Procedures/Studies: ECHOCARDIOGRAM LIMITED  Result Date: 03/21/2022    ECHOCARDIOGRAM LIMITED REPORT   Patient Name:   Jasmine Jasmine RESONIQUE HARLANe of Exam: 03/20/2022 Medical Rec #:  [id number]:       67.0 in Accession #:    [id number]:       223.0 lb Date of Birth:  12/113-Jun-1992SA:          2.118 Jasmine Patient Age:    31 years    BP:           129/82 mmHg Patient Gender: F           HR:           83 bpm. Exam Location:  [facility] Procedure: Limited Echo and Limited Color Doppler Indications:     Pulmonary embolus  History:         Patient has no prior history of Echocardiogram examinations.                  Risk Factors:Hypertension and Current Smoker. Pulmonary                   embolus.  Sonographer:     DoroWenda Lowerring Phys:  [id number] Phys: BriaKate SableIMPRESSIONS  1. Left ventricular ejection fraction, by estimation, is 60 to 65%. The left ventricle has normal function. The left ventricle has no regional wall motion abnormalities. There is mild left ventricular hypertrophy.  2. Right ventricular systolic function is normal. The right ventricular size is normal.  3. The  aortic valve is tricuspid.  4. The inferior vena cava is normal in size with greater than 50% respiratory variability, suggesting right atrial pressure of 3 mmHg. FINDINGS  Left Ventricle: Left ventricular ejection fraction, by estimation, is 60 to 65%. The left ventricle has normal function. The left ventricle has no regional wall motion abnormalities. The left ventricular internal cavity size was normal in size. There is  mild left ventricular hypertrophy. Right Ventricle: The right ventricular size is normal. No increase in right ventricular wall thickness. Right ventricular systolic function is normal. Left Atrium: Left atrial size was normal in size. Right Atrium: Right atrial size was normal in size. Pericardium: There is no evidence of pericardial effusion. Tricuspid Valve: The tricuspid valve is normal in structure. Tricuspid valve regurgitation is not demonstrated. Aortic Valve: The aortic valve is tricuspid. Aorta: The aortic root is normal in size and structure. Venous: The inferior vena cava is normal in size with greater than 50% respiratory variability, suggesting right atrial pressure of 3 mmHg. IAS/Shunts: No atrial level shunt detected by color flow Doppler. LEFT VENTRICLE PLAX 2D LVIDd:         4.40 cm LVIDs:         3.20 cm LV PW:         1.20 cm LV IVS:        1.10 cm LVOT diam:     2.10 cm LVOT Area:     3.46 cm  RIGHT VENTRICLE RV Basal diam:  3.40 cm RV Mid diam:    3.20 cm LEFT ATRIUM         Index LA diam:    3.60 cm 1.70 cm/Jasmine   AORTA Ao Root diam:  3.10 cm  SHUNTS Systemic Diam: 2.10 cm Kate Sable MD Electronically signed by Kate Sable MD Signature Date/Time: 03/21/2022/12:45:16 PM    Final    CT Angio Chest PE W and/or Wo Contrast  Result Date: 03/20/2022 CLINICAL DATA:  Pulmonary embolism. Status post C-section 2 weeks ago. EXAM: CT ANGIOGRAPHY CHEST WITH CONTRAST TECHNIQUE: Multidetector CT imaging of the chest was performed using the standard protocol during bolus administration of intravenous contrast. Multiplanar CT image reconstructions and MIPs were obtained to evaluate the vascular anatomy. RADIATION DOSE REDUCTION: This exam was performed according to the departmental dose-optimization program which includes automated exposure control, adjustment of the mA and/or kV according to patient size and/or use of iterative reconstruction technique. CONTRAST:  82m OMNIPAQUE IOHEXOL 350 MG/ML SOLN COMPARISON:  None Available. FINDINGS: Cardiovascular: Satisfactory opacification of the pulmonary arteries to the segmental level. Right middle and right lower lobar pulmonary artery emboli are identified. Filling defects noted within the right middle lobe, right lower and left lower lobe segmental pulmonary arteries. The RV to LV ratio is calculated to be 0.86. Heart size is normal.  No pericardial effusion. Mediastinum/Nodes: No enlarged mediastinal, hilar, or axillary lymph nodes. Thyroid gland, trachea, and esophagus demonstrate no significant findings. Lungs/Pleura: No pleural fluid. Mild mosaic attenuation within the lower lung zones. Patchy ground-glass and airspace densities are noted within the periphery of the right middle lobe and both lower lobes. This is most severe within the posterior right base, image 105/5. 4 mm left lower lobe lung nodule is identified, image 75/5. Upper Abdomen: No acute abnormality. Multiple gallstones are noted which measure up to 1.6 cm. Musculoskeletal: No chest wall abnormality. No acute or significant  osseous findings. Review of the MIP images confirms the above findings. IMPRESSION: 1. Examination is positive for bilateral  pulmonary emboli. No signs of right heart strain. The RV to LV ratio is equal to 0.86. 2. Patchy ground-glass and airspace densities are noted within the periphery of the right middle lobe and both lower lobes. This is most severe within the posterior right base. Findings are nonspecific and may reflect areas of pulmonary infarct versus underlying infectious or inflammatory process. 3. Gallstones. Critical Value/emergent results were called by telephone at the time of interpretation on 03/20/2022 at 4:34 am to provider [facility] , who verbally acknowledged these results. Electronically Signed   By: Kerby Moors Jasmine.D.   On: 03/20/2022 04:36   DG Chest 1 View  Result Date: 03/20/2022 CLINICAL DATA:  Chest pain. EXAM: CHEST  1 VIEW COMPARISON:  April 22, 2020 FINDINGS: The heart size and mediastinal contours are within normal limits. Both lungs are clear. The visualized skeletal structures are unremarkable. IMPRESSION: No active disease. Electronically Signed   By: Virgina Norfolk Jasmine.D.   On: 03/20/2022 02:32   US Venous Img Lower Right (DVT Study)  Result Date: 03/18/2022 CLINICAL DATA:  Calf pain for 4 days EXAM: Right LOWER EXTREMITY VENOUS DOPPLER ULTRASOUND TECHNIQUE: Gray-scale sonography with compression, as well as color and duplex ultrasound, were performed to evaluate the deep venous system(s) from the level of the common femoral vein through the popliteal and proximal calf veins. COMPARISON:  None Available. FINDINGS: VENOUS Normal compressibility of the common femoral, superficial femoral, and popliteal veins. Visualized portions of profunda femoral vein and great saphenous vein unremarkable. Doppler waveforms show normal direction of venous flow, normal respiratory plasticity and response to augmentation. However there is poor compression along veins in the calf region  posteriorly consistent with below-the-knee thrombus. Limited views of the contralateral common femoral vein are unremarkable. OTHER None. Limitations: none IMPRESSION: There is no evidence of deep venous thrombosis in the right lower extremity above the knee. However there is poor compression along veins in the calf consistent with below-the-knee thrombus. Electronically Signed   By: Jill Side Jasmine.D.   On: 03/18/2022 17:39   US OB Limited  Result Date: 03/02/2022 CLINICAL DATA:  Third trimester pregnancy with absent fetal heart tones EXAM: LIMITED OBSTETRIC ULTRASOUND COMPARISON:  02/28/2022 FINDINGS: Number of Fetuses: 1 Heart Rate: Absent Movement: Absent Presentation: Cephalic Placental Location: Anterior Previa: No Amniotic Fluid (Subjective):  Within normal limits. AFI: 13.5 cm BPD: 8.5 cm 34 w  1 d IMPRESSION: Absent fetal cardiac motion consistent with fetal demise. Critical Value/emergent results were called by telephone at the time of interpretation on 03/02/2022 at 9:24 pm to [facility] , who verbally acknowledged these results. Electronically Signed   By: Inez Catalina Jasmine.D.   On: 03/02/2022 21:26   US FETAL BPP WO NON STRESS  Result Date: 02/28/2022 CLINICAL DATA:  Third trimester pregnancy. Nonreactive nonstress test. EXAM: LIMITED OBSTETRIC ULTRASOUND AND BIOPHYSICAL PROFILE FINDINGS: Number of Fetuses: 1 Heart Rate:  132 bpm Movement: Yes Presentation: Cephalic Previa: No Placental Location: Anterior Amniotic Fluid (Subjective): Within normal limits AFI 15.9 cm (5%ile= 7.9 cm, 95%= 24.9 cm for 35 wks) BPD:  8.5cm 34w tod Maternal Findings: Cervix:  3.2 cm TA BIOPHYSICAL PROFILE Movement: 2 time: 11 minutes Breathing: 2 Tone:  2 Amniotic Fluid: 2 Total Score:  8 IMPRESSION: Single living intrauterine fetus in cephalic presentation. Amniotic fluid volume within normal limits, with AFI of 15.9 cm. Biophysical profile score is 8 out of 8. Electronically Signed   By: Marlaine Hind Jasmine.D.   On:  02/28/2022 14:31  US OB Limited  Result Date: 02/28/2022 CLINICAL DATA:  Third trimester pregnancy. Nonreactive nonstress test. EXAM: LIMITED OBSTETRIC ULTRASOUND AND BIOPHYSICAL PROFILE FINDINGS: Number of Fetuses: 1 Heart Rate:  132 bpm Movement: Yes Presentation: Cephalic Previa: No Placental Location: Anterior Amniotic Fluid (Subjective): Within normal limits AFI 15.9 cm (5%ile= 7.9 cm, 95%= 24.9 cm for 35 wks) BPD:  8.5cm 34w tod Maternal Findings: Cervix:  3.2 cm TA BIOPHYSICAL PROFILE Movement: 2 time: 11 minutes Breathing: 2 Tone:  2 Amniotic Fluid: 2 Total Score:  8 IMPRESSION: Single living intrauterine fetus in cephalic presentation. Amniotic fluid volume within normal limits, with AFI of 15.9 cm. Biophysical profile score is 8 out of 8. Electronically Signed   By: Marlaine Hind Jasmine.D.   On: 02/28/2022 14:31     Subjective: Patient was seen and examined at bedside.  Overnight events noted.   Patient report doing much better,  still continues to have small amount of blood in the phlegm.   Denies any shortness of breath.  Patient wants to be discharged.  Patient being discharged home.  Discharge Exam: Vitals:   03/22/22 0420 03/22/22 0753  BP: 112/71 122/72  Pulse: 87 87  Resp: 18 18  Temp: 99.3 F (37.4 C) 98.4 F (36.9 C)  SpO2: 100% 98%   Vitals:   03/21/22 1938 03/22/22 0011 03/22/22 0420 03/22/22 0753  BP: 118/65 112/69 112/71 122/72  Pulse: 92 87 87 87  Resp: 18 17 18 18  $ Temp:  98.3 F (36.8 C) 99.3 F (37.4 C) 98.4 F (36.9 C)  TempSrc:      SpO2: 100% 100% 100% 98%  Weight:      Height:        General: Pt is alert, awake, not in acute distress. Cardiovascular: RRR, S1/S2 +, no rubs, no gallops Respiratory: CTA bilaterally, no wheezing, no rhonchi Abdominal: Soft, NT, ND, bowel sounds + Extremities: no edema, no cyanosis    The results of significant diagnostics from this hospitalization (including imaging, microbiology, ancillary and laboratory) are  listed below for reference.     Microbiology: Recent Results (from the past 240 hour(s))  Resp panel by RT-PCR (RSV, Flu A&B, Covid) Anterior Nasal Swab     Status: None   Collection Time: 03/20/22  5:20 AM   Specimen: Anterior Nasal Swab  Result Value Ref Range Status   SARS Coronavirus 2 by RT PCR NEGATIVE NEGATIVE Final    Comment: (NOTE) SARS-CoV-2 target nucleic acids are NOT DETECTED.  The SARS-CoV-2 RNA is generally detectable in upper respiratory specimens during the acute phase of infection. The lowest concentration of SARS-CoV-2 viral copies this assay can detect is 138 copies/mL. A negative result does not preclude SARS-Cov-2 infection and should not be used as the sole basis for treatment or other patient management decisions. A negative result may occur with  improper specimen collection/handling, submission of specimen other than nasopharyngeal swab, presence of viral mutation(s) within the areas targeted by this assay, and inadequate number of viral copies(<138 copies/mL). A negative result must be combined with clinical observations, patient history, and epidemiological information. The expected result is Negative.  Fact Sheet for Patients:  EntrepreneurPulse.com.au  Fact Sheet for Healthcare Providers:  IncredibleEmployment.be  This test is no t yet approved or cleared by the [place] FDA and  has been authorized for detection and/or diagnosis of SARS-CoV-2 by FDA under an Emergency Use Authorization (EUA). This EUA will remain  in effect (meaning this test can be used) for the duration  of the COVID-19 declaration under Section 564(b)(1) of the Act, 21 U.S.C.section 360bbb-3(b)(1), unless the authorization is terminated  or revoked sooner.       Influenza A by PCR NEGATIVE NEGATIVE Final   Influenza B by PCR NEGATIVE NEGATIVE Final    Comment: (NOTE) The Xpert Xpress SARS-CoV-2/FLU/RSV plus assay is intended as an  aid in the diagnosis of influenza from Nasopharyngeal swab specimens and should not be used as a sole basis for treatment. Nasal washings and aspirates are unacceptable for Xpert Xpress SARS-CoV-2/FLU/RSV testing.  Fact Sheet for Patients: EntrepreneurPulse.com.au  Fact Sheet for Healthcare Providers: IncredibleEmployment.be  This test is not yet approved or cleared by the [place] FDA and has been authorized for detection and/or diagnosis of SARS-CoV-2 by FDA under an Emergency Use Authorization (EUA). This EUA will remain in effect (meaning this test can be used) for the duration of the COVID-19 declaration under Section 564(b)(1) of the Act, 21 U.S.C. section 360bbb-3(b)(1), unless the authorization is terminated or revoked.     Resp Syncytial Virus by PCR NEGATIVE NEGATIVE Final    Comment: (NOTE) Fact Sheet for Patients: EntrepreneurPulse.com.au  Fact Sheet for Healthcare Providers: IncredibleEmployment.be  This test is not yet approved or cleared by the [place] FDA and has been authorized for detection and/or diagnosis of SARS-CoV-2 by FDA under an Emergency Use Authorization (EUA). This EUA will remain in effect (meaning this test can be used) for the duration of the COVID-19 declaration under Section 564(b)(1) of the Act, 21 U.S.C. section 360bbb-3(b)(1), unless the authorization is terminated or revoked.  Performed at [facility], [address] [place], [place] [address]      Labs: BNP (last 3 results) No results for input(s): "BNP" in the last 8760 hours. Basic Metabolic Panel: Recent Labs  Lab 03/18/22 1814 03/20/22 0211 03/21/22 0323  NA 137 137 135  K 3.7 3.4* 3.8  CL 106 104 103  CO2 22 23 22  $ GLUCOSE 101* 99 91  BUN 8 11 8  $ CREATININE 0.71 0.73 0.73  CALCIUM 9.1 9.1 9.0   Liver Function Tests: Recent Labs  Lab 03/18/22 1814 03/20/22 0211  AST  14* 14*  ALT 13 12  ALKPHOS 87 92  BILITOT 0.5 0.3  PROT 7.7 8.1  ALBUMIN 4.0 4.0   Recent Labs  Lab 03/20/22 0211  LIPASE 27   No results for input(s): "AMMONIA" in the last 168 hours. CBC: Recent Labs  Lab 03/18/22 1814 03/20/22 0211 03/21/22 0323 03/22/22 0259  WBC 8.1 7.4 6.6  --   NEUTROABS 5.6 4.8  --   --   HGB 12.1 11.8* 10.7* 11.2*  HCT 38.6 37.1 33.9* 34.7*  MCV 94.1 93.7 94.2  --   PLT 305 308 264  --    Cardiac Enzymes: No results for input(s): "CKTOTAL", "CKMB", "CKMBINDEX", "TROPONINI" in the last 168 hours. BNP: Invalid input(s): "POCBNP" CBG: No results for input(s): "GLUCAP" in the last 168 hours. D-Dimer No results for input(s): "DDIMER" in the last 72 hours. Hgb A1c No results for input(s): "HGBA1C" in the last 72 hours. Lipid Profile No results for input(s): "CHOL", "HDL", "LDLCALC", "TRIG", "CHOLHDL", "LDLDIRECT" in the last 72 hours. Thyroid function studies No results for input(s): "TSH", "T4TOTAL", "T3FREE", "THYROIDAB" in the last 72 hours.  Invalid input(s): "FREET3" Anemia work up No results for input(s): "VITAMINB12", "FOLATE", "FERRITIN", "TIBC", "IRON", "RETICCTPCT" in the last 72 hours. Urinalysis    Component Value Date/Time   COLORURINE STRAW (A) 03/20/2022 0440  APPEARANCEUR CLEAR (A) 03/20/2022 0440   APPEARANCEUR Clear 09/03/2021 1153   LABSPEC 1.026 03/20/2022 0440   LABSPEC 1.001 11/17/2013 2051   PHURINE 6.0 03/20/2022 0440   GLUCOSEU NEGATIVE 03/20/2022 0440   GLUCOSEU Negative 11/17/2013 2051   HGBUR MODERATE (A) 03/20/2022 0440   BILIRUBINUR NEGATIVE 03/20/2022 0440   BILIRUBINUR neg 02/14/2022 1326   BILIRUBINUR Negative 09/03/2021 1153   BILIRUBINUR Negative 11/17/2013 2051   KETONESUR NEGATIVE 03/20/2022 0440   PROTEINUR NEGATIVE 03/20/2022 0440   UROBILINOGEN 0.2 02/14/2022 1326   NITRITE NEGATIVE 03/20/2022 0440   LEUKOCYTESUR NEGATIVE 03/20/2022 0440   LEUKOCYTESUR Negative 11/17/2013 2051   Sepsis  Labs Recent Labs  Lab 03/18/22 1814 03/20/22 0211 03/21/22 0323  WBC 8.1 7.4 6.6   Microbiology Recent Results (from the past 240 hour(s))  Resp panel by RT-PCR (RSV, Flu A&B, Covid) Anterior Nasal Swab     Status: None   Collection Time: 03/20/22  5:20 AM   Specimen: Anterior Nasal Swab  Result Value Ref Range Status   SARS Coronavirus 2 by RT PCR NEGATIVE NEGATIVE Final    Comment: (NOTE) SARS-CoV-2 target nucleic acids are NOT DETECTED.  The SARS-CoV-2 RNA is generally detectable in upper respiratory specimens during the acute phase of infection. The lowest concentration of SARS-CoV-2 viral copies this assay can detect is 138 copies/mL. A negative result does not preclude SARS-Cov-2 infection and should not be used as the sole basis for treatment or other patient management decisions. A negative result may occur with  improper specimen collection/handling, submission of specimen other than nasopharyngeal swab, presence of viral mutation(s) within the areas targeted by this assay, and inadequate number of viral copies(<138 copies/mL). A negative result must be combined with clinical observations, patient history, and epidemiological information. The expected result is Negative.  Fact Sheet for Patients:  EntrepreneurPulse.com.au  Fact Sheet for Healthcare Providers:  IncredibleEmployment.be  This test is no t yet approved or cleared by the [place] FDA and  has been authorized for detection and/or diagnosis of SARS-CoV-2 by FDA under an Emergency Use Authorization (EUA). This EUA will remain  in effect (meaning this test can be used) for the duration of the COVID-19 declaration under Section 564(b)(1) of the Act, 21 U.S.C.section 360bbb-3(b)(1), unless the authorization is terminated  or revoked sooner.       Influenza A by PCR NEGATIVE NEGATIVE Final   Influenza B by PCR NEGATIVE NEGATIVE Final    Comment: (NOTE) The Xpert  Xpress SARS-CoV-2/FLU/RSV plus assay is intended as an aid in the diagnosis of influenza from Nasopharyngeal swab specimens and should not be used as a sole basis for treatment. Nasal washings and aspirates are unacceptable for Xpert Xpress SARS-CoV-2/FLU/RSV testing.  Fact Sheet for Patients: EntrepreneurPulse.com.au  Fact Sheet for Healthcare Providers: IncredibleEmployment.be  This test is not yet approved or cleared by the [place] FDA and has been authorized for detection and/or diagnosis of SARS-CoV-2 by FDA under an Emergency Use Authorization (EUA). This EUA will remain in effect (meaning this test can be used) for the duration of the COVID-19 declaration under Section 564(b)(1) of the Act, 21 U.S.C. section 360bbb-3(b)(1), unless the authorization is terminated or revoked.     Resp Syncytial Virus by PCR NEGATIVE NEGATIVE Final    Comment: (NOTE) Fact Sheet for Patients: EntrepreneurPulse.com.au  Fact Sheet for Healthcare Providers: IncredibleEmployment.be  This test is not yet approved or cleared by the [place] FDA and has been authorized for detection and/or diagnosis of SARS-CoV-2 by  FDA under an Emergency Use Authorization (EUA). This EUA will remain in effect (meaning this test can be used) for the duration of the COVID-19 declaration under Section 564(b)(1) of the Act, 21 U.S.C. section 360bbb-3(b)(1), unless the authorization is terminated or revoked.  Performed at [facility], [address] [place], [place] [address]      Time coordinating discharge: Over 30 minutes  SIGNED:   Shawna Clamp, MD  [facility] 03/22/2022, 2:41 PM Pager   If 7PM-7AM, please contact night-coverage

## 2022-03-28 ENCOUNTER — Ambulatory Visit: Payer: Medicaid Other | Admitting: Obstetrics and Gynecology

## 2022-03-28 DIAGNOSIS — Z9889 Other specified postprocedural states: Secondary | ICD-10-CM

## 2022-03-28 DIAGNOSIS — I2699 Other pulmonary embolism without acute cor pulmonale: Secondary | ICD-10-CM

## 2022-03-28 DIAGNOSIS — O871 Deep phlebothrombosis in the puerperium: Secondary | ICD-10-CM

## 2022-03-29 ENCOUNTER — Inpatient Hospital Stay: Admit: 2022-03-29 | Payer: Self-pay

## 2022-04-02 ENCOUNTER — Encounter: Payer: Self-pay | Admitting: Obstetrics and Gynecology

## 2022-04-02 ENCOUNTER — Ambulatory Visit (INDEPENDENT_AMBULATORY_CARE_PROVIDER_SITE_OTHER): Payer: Medicaid Other | Admitting: Obstetrics and Gynecology

## 2022-04-02 VITALS — BP 127/85 | HR 77 | Ht 67.0 in | Wt 215.3 lb

## 2022-04-02 DIAGNOSIS — Z9889 Other specified postprocedural states: Secondary | ICD-10-CM

## 2022-04-02 NOTE — Progress Notes (Signed)
HPI:      [name] is a 32 y.o. [phone] who LMP was No LMP recorded.  Subjective:   She presents today after noting last week that she had some drainage from her incision.  She has been keeping it covered.  She reports no fevers chills or pain at the incision site.  She reports it is not red. She is being treated for DVT and subsequent PE.    Hx: The following portions of the patient's history were reviewed and updated as appropriate:             She  has a past medical history of Acid reflux, Hemorrhoid (11/2015), and Tobacco abuse. She does not have any pertinent problems on file. She  has a past surgical history that includes Cesarean section (03/03/2022). Her family history includes Alcohol abuse in her maternal grandfather; Cancer (age of onset: 35) in her maternal grandmother; Congestive Heart Failure in her father; Factor V Leiden deficiency in her father; Heart disease in her father; Hypertension in her father, mother, and paternal grandfather; Migraines in her mother; Stroke in her father. She  reports that she has been smoking cigarettes. She has a 6.00 pack-year smoking history. She has never used smokeless tobacco. She reports that she does not drink alcohol and does not use drugs. She has a current medication list which includes the following prescription(s): apixaban, labetalol, [DISCONTINUED] albuterol, [DISCONTINUED] fluticasone, [DISCONTINUED] metoprolol tartrate, and [DISCONTINUED] omeprazole. She is allergic to penicillins.       Review of Systems:  Review of Systems  Constitutional: Denied constitutional symptoms, night sweats, recent illness, fatigue, fever, insomnia and weight loss.  Eyes: Denied eye symptoms, eye pain, photophobia, vision change and visual disturbance.  Ears/Nose/Throat/Neck: Denied ear, nose, throat or neck symptoms, hearing loss, nasal discharge, sinus congestion and sore throat.  Cardiovascular: Denied cardiovascular symptoms, arrhythmia,  chest pain/pressure, edema, exercise intolerance, orthopnea and palpitations.  Respiratory: Denied pulmonary symptoms, asthma, pleuritic pain, productive sputum, cough, dyspnea and wheezing.  Gastrointestinal: Denied, gastro-esophageal reflux, melena, nausea and vomiting.  Genitourinary: See HPI for additional information.  Musculoskeletal: Denied musculoskeletal symptoms, stiffness, swelling, muscle weakness and myalgia.  Dermatologic: Denied dermatology symptoms, rash and scar.  Neurologic: Denied neurology symptoms, dizziness, headache, neck pain and syncope.  Psychiatric: Denied psychiatric symptoms, anxiety and depression.  Endocrine: Denied endocrine symptoms including hot flashes and night sweats.   Meds:   Current Outpatient Medications on File Prior to Visit  Medication Sig Dispense Refill   apixaban (ELIQUIS) 5 MG TABS tablet Advised to take Eliquis 10 mg twice a day for 7 days followed by 5 mg twice daily for next 6 to 9 months for pulmonary embolism. 60 tablet 6   labetalol (NORMODYNE) 100 MG tablet Take 1 tablet (100 mg total) by mouth 2 (two) times daily. 30 tablet 1   [DISCONTINUED] albuterol (VENTOLIN HFA) 108 (90 Base) MCG/ACT inhaler Inhale 2 puffs into the lungs every 6 (six) hours as needed for wheezing or shortness of breath. 8 g 0   [DISCONTINUED] fluticasone (FLONASE) 50 MCG/ACT nasal spray Place 2 sprays into both nostrils daily. 16 g 6   [DISCONTINUED] metoprolol tartrate (LOPRESSOR) 25 MG tablet Take 0.5 tablets (12.5 mg total) by mouth 2 (two) times daily as needed (Palpitations). (Patient not taking: Reported on 07/08/2019) 20 tablet 0   [DISCONTINUED] omeprazole (PRILOSEC) 20 MG capsule Take 1 capsule (20 mg total) by mouth daily. 30 capsule 0   No current facility-administered medications on file prior  to visit.      Objective:     Vitals:   04/02/22 1406  BP: 127/85  Pulse: 77   Filed Weights   04/02/22 1406  Weight: 215 lb 4.8 oz (97.7 kg)               Examination of the abdominal incision reveals 2 small areas that when compressed during nonmalodorous appearing thickened fluid.  Not exactly consistent with the appearance of pus.  There is no erythema there is no tenderness.  Pocket underneath the incision drained by palpation and by opening the incision just a little bit further.  She is however this without issue.          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/20/2022   DVT (deep venous thrombosis) ([place]) 03/20/2022   History of IUFD 03/20/2022   Essential hypertension 03/20/2022   Postpartum care following cesarean delivery 03/05/2022   Encounter for assessment for fetal demise 03/02/2022   Labor and delivery, indication for care 02/28/2022   Gestational hypertension, third trimester 02/06/2022   Gestational hypertension without significant proteinuria, antepartum 01/16/2022   Supervision of high risk pregnancy, antepartum 08/23/2021   History of premature delivery 04/29/2016   Overweight (BMI 25.0-29.9) 07/28/2015   Tobacco user 05/31/2014     1. Postoperative state   2. Postpartum care following cesarean delivery     Some wound drainage - doubt infection   Plan:            1.  Wound care discussed.  Plan follow-up in 1 week.  Patient to inform us if it becomes red hot swollen or if she becomes febrile. Orders No orders of the defined types were placed in this encounter.   No orders of the defined types were placed in this encounter.     F/U  No follow-ups on file.  Finis Bud, M.D. 04/02/2022 2:29 PM

## 2022-04-02 NOTE — Progress Notes (Signed)
Patient presents today for an incision check. She had a cesarean delivery on 03/03/22. Starting Friday 03/29/22 she noticed an opening in her incision and today she reports redness and pain around the area. She denies odor and fever. Patient is also continuing to take Eliquis 5 mg bid due to recent DTV and PE. No additional concerns.

## 2022-04-09 ENCOUNTER — Ambulatory Visit (INDEPENDENT_AMBULATORY_CARE_PROVIDER_SITE_OTHER): Payer: Medicaid Other | Admitting: Obstetrics

## 2022-04-09 VITALS — BP 132/88 | HR 84 | Wt 213.6 lb

## 2022-04-09 DIAGNOSIS — Z9889 Other specified postprocedural states: Secondary | ICD-10-CM

## 2022-04-10 NOTE — Progress Notes (Signed)
Obstetrics & Gynecology Office Visit   Chief Complaint:  Chief Complaint  Patient presents with   Routine Prenatal Visit    History of Present Illness: Jasmine Gray presents today for another incision check. She underwent a primary CS in late January after being diagnosed with a term Fetal demise and a failed IOL. Her operative course is well documented in previous entries by Dr. Jeannie Fend. She is here today to have the incision looked at. She shares that there was previously an open area that had some drainage, and that she was instructed to RTC to have it looked at in 4-5 weeks. Per her report, Dr. Amalia Hailey removed the steri strips at that last visit on 04/02/2022, and told her to monitor it for any drainage or signs of infection.    Review of Systems:  Review of Systems  Constitutional: Negative.   HENT: Negative.    Eyes: Negative.   Respiratory: Negative.    Cardiovascular: Negative.   Gastrointestinal: Negative.   Genitourinary: Negative.   Musculoskeletal: Negative.   Skin:  Positive for rash.       Surgical incsion with a small opening on the site of the Cesarean section.  She c/o some reddened areas above and below the incision line; that are a reaction to the surgical tape or steri strips.  Neurological: Negative.   Endo/Heme/Allergies: Negative.   Psychiatric/Behavioral: Negative.       Past Medical History:  Past Medical History:  Diagnosis Date   Acid reflux    on no RX at present   Hemorrhoid 11/2015   starting on past Sat. Pt states it is on the outside   Tobacco abuse     Past Surgical History:  Past Surgical History:  Procedure Laterality Date   CESAREAN SECTION  03/03/2022   Procedure: CESAREAN SECTION;  Surgeon: Harlin Heys, MD;  Location: [facility] ORS;  Service: Obstetrics;;    Gynecologic History: No LMP recorded.  Obstetric History: [id number]  Family History:  Family History  Problem Relation Age of Onset   Hypertension Mother    Migraines  Mother    Heart disease Father    Hypertension Father    Congestive Heart Failure Father    Stroke Father    Factor V Leiden deficiency Father    Cancer Maternal Grandmother 21       breast   Alcohol abuse Maternal Grandfather    Hypertension Paternal Grandfather    Depression Neg Hx     Social History:  Social History   Socioeconomic History   Marital status: Significant Other    Spouse name: Not on file   Number of children: 2   Years of education: 12   Highest education level: Not on file  Occupational History   Occupation: stay at home mom  Tobacco Use   Smoking status: Every Day    Packs/day: 0.50    Years: 12.00    Total pack years: 6.00    Types: Cigarettes   Smokeless tobacco: Never  Vaping Use   Vaping Use: Never used  Substance and Sexual Activity   Alcohol use: No   Drug use: No   Sexual activity: Not Currently    Partners: Male    Birth control/protection: Injection  Other Topics Concern   Not on file  Social History Narrative   Not on file   Social Determinants of Health   Financial Resource Strain: Medium Risk (08/23/2021)   Overall Financial Resource Strain (CARDIA)  Difficulty of Paying Living Expenses: Somewhat hard  Food Insecurity: No Food Insecurity (03/20/2022)   Hunger Vital Sign    Worried About Running Out of Food in the Last Year: Never true    Ran Out of Food in the Last Year: Never true  Transportation Needs: No Transportation Needs (03/20/2022)   PRAPARE - Hydrologist (Medical): No    Lack of Transportation (Non-Medical): No  Physical Activity: Insufficiently Active (08/23/2021)   Exercise Vital Sign    Days of Exercise per Week: 2 days    Minutes of Exercise per Session: 40 min  Stress: No Stress Concern Present (08/23/2021)   [place]    Feeling of Stress : Not at all  Social Connections: Unknown (08/23/2021)   Social Connection  and Isolation Panel [NHANES]    Frequency of Communication with Friends and Family: More than three times a week    Frequency of Social Gatherings with Friends and Family: More than three times a week    Attends Religious Services: Never    Marine scientist or Organizations: No    Attends Archivist Meetings: Never    Marital Status: Not on file  Intimate Partner Violence: Not At Risk (03/20/2022)   Humiliation, Afraid, Rape, and Kick questionnaire    Fear of Current or Ex-Partner: No    Emotionally Abused: No    Physically Abused: No    Sexually Abused: No    Allergies:  Allergies  Allergen Reactions   Penicillins Hives    Medications: Prior to Admission medications   Medication Sig Start Date End Date Taking? Authorizing Provider  apixaban (ELIQUIS) 5 MG TABS tablet Advised to take Eliquis 10 mg twice a day for 7 days followed by 5 mg twice daily for next 6 to 9 months for pulmonary embolism. 03/22/22  Yes Duard Brady, MD  labetalol (NORMODYNE) 100 MG tablet Take 1 tablet (100 mg total) by mouth 2 (two) times daily. 03/11/22  Yes Dominic, Nunzio Cobbs, CNM  albuterol (VENTOLIN HFA) 108 (90 Base) MCG/ACT inhaler Inhale 2 puffs into the lungs every 6 (six) hours as needed for wheezing or shortness of breath. 02/14/20 04/22/20  Sharion Balloon, FNP  fluticasone (FLONASE) 50 MCG/ACT nasal spray Place 2 sprays into both nostrils daily. 02/14/20 04/22/20  Sharion Balloon, FNP  metoprolol tartrate (LOPRESSOR) 25 MG tablet Take 0.5 tablets (12.5 mg total) by mouth 2 (two) times daily as needed (Palpitations). Patient not taking: Reported on 07/08/2019 03/29/19 04/22/20  Duffy Bruce, MD  omeprazole (PRILOSEC) 20 MG capsule Take 1 capsule (20 mg total) by mouth daily. 07/17/19 04/22/20  McVey, Gelene Mink, PA-C    Physical Exam Vitals:  Vitals:   04/09/22 1618  BP: 132/88  Pulse: 84   No LMP recorded.  Physical Exam Nursing note reviewed.  Constitutional:       Appearance: Normal appearance.  HENT:     Head: Normocephalic and atraumatic.  Cardiovascular:     Rate and Rhythm: Normal rate and regular rhythm.     Pulses: Normal pulses.     Heart sounds: Normal heart sounds.     Comments: Blood pressure today is 132/88 Pulmonary:     Effort: Pulmonary effort is normal.     Breath sounds: Normal breath sounds.  Genitourinary:    Comments: deferred Musculoskeletal:        General: Normal range of motion.  Skin:  General: Skin is warm and dry.     Findings: Rash present.     Comments: Cesarean section incision evaluated:  with one open area approximately 1 cm in length midway on the incision line; no drainage noted from that opening. Area is otherwise intact and clean, dry.    Neurological:     General: No focal deficit present.     Mental Status: She is alert and oriented to person, place, and time.  Psychiatric:        Mood and Affect: Mood normal.        Behavior: Behavior normal.      Assessment: 32 y.o. [id number] for follow up incision check 5 weeks post op C section  Plan: Problem List Items Addressed This Visit       Other   Postpartum care following cesarean delivery   Other Visit Diagnoses     Postoperative state    -  Primary      Advised Aerial to continue to keep this area clean and dry. Additional 4x4 s provided to her.She is advised to make another appointment over the next few weeks to have Dr. Amalia Hailey again assess the incision.  Imagene Riches, CNM  04/10/2022 5:24 PM

## 2022-04-11 ENCOUNTER — Telehealth: Payer: Self-pay

## 2022-04-11 ENCOUNTER — Ambulatory Visit: Payer: Medicaid Other | Admitting: Nurse Practitioner

## 2022-04-11 NOTE — Telephone Encounter (Signed)
Lvm to see reason why pt missed new pt appt today. Per Vernelle Emerald

## 2022-04-12 ENCOUNTER — Other Ambulatory Visit: Payer: Self-pay | Admitting: Licensed Practical Nurse

## 2022-04-12 DIAGNOSIS — O133 Gestational [pregnancy-induced] hypertension without significant proteinuria, third trimester: Secondary | ICD-10-CM

## 2022-04-13 ENCOUNTER — Other Ambulatory Visit: Payer: Self-pay

## 2022-04-13 ENCOUNTER — Emergency Department
Admission: EM | Admit: 2022-04-13 | Discharge: 2022-04-13 | Disposition: A | Discharge status: Home / Self Care | Payer: Medicaid Other | Attending: Emergency Medicine | Admitting: Emergency Medicine

## 2022-04-13 DIAGNOSIS — Z7901 Long term (current) use of anticoagulants: Secondary | ICD-10-CM | POA: Diagnosis not present

## 2022-04-13 DIAGNOSIS — R42 Dizziness and giddiness: Secondary | ICD-10-CM | POA: Insufficient documentation

## 2022-04-13 DIAGNOSIS — Z86711 Personal history of pulmonary embolism: Secondary | ICD-10-CM | POA: Diagnosis not present

## 2022-04-13 DIAGNOSIS — R030 Elevated blood-pressure reading, without diagnosis of hypertension: Secondary | ICD-10-CM | POA: Diagnosis not present

## 2022-04-13 DIAGNOSIS — R0602 Shortness of breath: Secondary | ICD-10-CM | POA: Diagnosis not present

## 2022-04-13 DIAGNOSIS — F419 Anxiety disorder, unspecified: Secondary | ICD-10-CM

## 2022-04-13 DIAGNOSIS — R079 Chest pain, unspecified: Secondary | ICD-10-CM | POA: Insufficient documentation

## 2022-04-13 LAB — CBC
HCT: 39.7 % (ref 36.0–46.0)
Hemoglobin: 12.4 g/dL (ref 12.0–15.0)
MCH: 28.1 pg (ref 26.0–34.0)
MCHC: 31.2 g/dL (ref 30.0–36.0)
MCV: 90 fL (ref 80.0–100.0)
Platelets: 306 10*3/uL (ref 150–400)
RBC: 4.41 MIL/uL (ref 3.87–5.11)
RDW: 14.6 % (ref 11.5–15.5)
WBC: 5.1 10*3/uL (ref 4.0–10.5)
nRBC: 0 % (ref 0.0–0.2)

## 2022-04-13 LAB — TROPONIN I (HIGH SENSITIVITY): Troponin I (High Sensitivity): 3 ng/L (ref <18)

## 2022-04-13 LAB — URINALYSIS, ROUTINE W REFLEX MICROSCOPIC
Bacteria, UA: NONE SEEN
Bilirubin Urine: NEGATIVE
Glucose, UA: NEGATIVE mg/dL
Ketones, ur: NEGATIVE mg/dL
Leukocytes,Ua: NEGATIVE
Nitrite: NEGATIVE
Protein, ur: NEGATIVE mg/dL
Specific Gravity, Urine: 1.002 — ABNORMAL LOW (ref 1.005–1.030)
Squamous Epithelial / HPF: NONE SEEN /HPF (ref 0–5)
pH: 6 (ref 5.0–8.0)

## 2022-04-13 LAB — BASIC METABOLIC PANEL
Anion gap: 4 — ABNORMAL LOW (ref 5–15)
BUN: 9 mg/dL (ref 6–20)
CO2: 21 mmol/L — ABNORMAL LOW (ref 22–32)
Calcium: 8.5 mg/dL — ABNORMAL LOW (ref 8.9–10.3)
Chloride: 110 mmol/L (ref 98–111)
Creatinine, Ser: 0.79 mg/dL (ref 0.44–1.00)
GFR, Estimated: >60 mL/min (ref >60)
Glucose, Bld: 100 mg/dL — ABNORMAL HIGH (ref 70–99)
Potassium: 3.2 mmol/L — ABNORMAL LOW (ref 3.5–5.1)
Sodium: 135 mmol/L (ref 135–145)

## 2022-04-13 LAB — POC URINE PREG, ED: Preg Test, Ur: NEGATIVE

## 2022-04-13 MED ORDER — LORAZEPAM 1 MG PO TABS
1.0000 mg | ORAL_TABLET | Freq: Once | ORAL | Status: AC
  Administered 2022-04-13: 1 mg via ORAL
  Filled 2022-04-13: qty 1

## 2022-04-13 MED ORDER — POTASSIUM CHLORIDE CRYS ER 20 MEQ PO TBCR
40.0000 meq | EXTENDED_RELEASE_TABLET | Freq: Once | ORAL | Status: AC
  Administered 2022-04-13: 40 meq via ORAL
  Filled 2022-04-13: qty 2

## 2022-04-13 NOTE — Discharge Instructions (Signed)
You were seen in the emergency department and initially had elevated blood pressure however this improved in the emergency department on its own with anxiety treatment.  It is importantly follow-up closely with your obstetrician, discuss postpartum anxiety.  You are given information for counseling services as an outpatient.  You are also given information to establish care with a primary care provider.  Return to the emergency department for any worsening symptoms.

## 2022-04-13 NOTE — ED Triage Notes (Signed)
Pt to ED from home for dizziness and HTN post partum c-section. Pt is CAOx4 and in no acute distress in triage. Pt is 6 weeks post cesarian section and is feeling very anxious in triage. Pt was seen 2/12 for similar and had several blood clots in her lungs and legs.

## 2022-04-13 NOTE — ED Provider Notes (Signed)
[facility] Provider Note    Event Date/Time   First MD Initiated Contact with Patient 04/13/22 1756     (approximate)   History   Postpartum Complications   HPI  Jasmine Gray is a 32 y.o. female past medical history significant for recent intrauterine fetal demise complicated by preeclampsia and pulmonary embolism on Eliquis, who presents to the emergency department with dizziness and elevated blood pressure.  States that she gets a wave of feeling very anxious with chest pain and shortness of breath, feels very flushed.  States that she started to feel more anxious whenever she arrived because she started to think about her complications when she had delivery.  States that she has been feeling very anxious at home.  Denies any thoughts of suicide or depression.  Denies any active chest pain or shortness of breath at this time.  Took her antihypertensive medications and her blood thinner this morning.     Physical Exam   Triage Vital Signs: ED Triage Vitals  Enc Vitals Group     BP 04/13/22 1727 (!) 178/105     Pulse Rate 04/13/22 1727 90     Resp 04/13/22 1727 (!) 24     Temp 04/13/22 1727 97.9 F (36.6 C)     Temp Source 04/13/22 1727 Oral     SpO2 04/13/22 1727 98 %     Weight 04/13/22 1728 213 lb 6.5 oz (96.8 kg)     Height 04/13/22 1728 '5\' 7"'$  (1.702 m)     Head Circumference --      Peak Flow --      Pain Score 04/13/22 1728 0     Pain Loc --      Pain Edu? --      Excl. in [place]? --     Most recent vital signs: Vitals:   04/13/22 1745 04/13/22 1800  BP:  139/83  Pulse: 72 63  Resp: 18 14  Temp:    SpO2: 100% 100%    Physical Exam Constitutional:      Appearance: She is well-developed.  HENT:     Head: Atraumatic.  Eyes:     Conjunctiva/sclera: Conjunctivae normal.  Cardiovascular:     Rate and Rhythm: Regular rhythm.  Pulmonary:     Effort: No respiratory distress.  Abdominal:     General: There is no distension.   Musculoskeletal:        General: Normal range of motion.     Cervical back: Normal range of motion.  Skin:    General: Skin is warm.  Neurological:     Mental Status: She is alert. Mental status is at baseline.  Psychiatric:        Mood and Affect: Mood is anxious. Affect is tearful.     IMPRESSION / MDM / ASSESSMENT AND PLAN / ED COURSE  I reviewed the triage vital signs and the nursing notes.  On chart review patient had a C-section following placental abruption with intrauterine fetal demise complicated by postpartum hypertension and pulmonary embolism on Eliquis  Differential diagnosis including dysrhythmia, ACS, pneumonia, electrolyte abnormality, anemia, anxiety, preeclampsia  EKG  I, Nathaniel Man, the attending physician, personally viewed and interpreted this ECG.   Rate: Normal  Rhythm: Normal sinus  Axis: Normal  Intervals: Normal  ST&T Change: None  No tachycardic or bradycardic dysrhythmias while on cardiac telemetry.  LABS (all labs ordered are listed, but only abnormal results are displayed) Labs interpreted as -  Labs Reviewed  BASIC METABOLIC PANEL - Abnormal; Notable for the following components:      Result Value   Potassium 3.2 (*)    CO2 21 (*)    Glucose, Bld 100 (*)    Calcium 8.5 (*)    Anion gap 4 (*)    All other components within normal limits  URINALYSIS, ROUTINE W REFLEX MICROSCOPIC - Abnormal; Notable for the following components:   Color, Urine STRAW (*)    APPearance CLEAR (*)    Specific Gravity, Urine 1.002 (*)    Hgb urine dipstick SMALL (*)    All other components within normal limits  CBC  POC URINE PREG, ED  TROPONIN I (HIGH SENSITIVITY)    TREATMENT  1 p.o. Ativan  MDM    Patient presented to the emergency department and had an elevated blood pressure.  Resolved on its own with improvement of the patient's anxiety.  Given 1 dose of p.o. Ativan with significant improvement and resolution of her symptoms.  Given  oral replacement of her mildly low potassium at 3.2.  EKG without signs of acute ischemia or dysrhythmia.  Troponin negative.  Low risk heart score of 1.  Clinical picture is most consistent with anxiety, have a low suspicion for recurrent pulmonary embolism, no tachycardia, no shortness of breath and compliant with her Eliquis.  Discussed close follow-up with her obstetrician and discussed follow-up with her primary care physician.  Given information for counseling services.  Given return precautions for any worsening symptoms.  PROCEDURES:  Critical Care performed: No  Procedures  Patient's presentation is most consistent with acute presentation with potential threat to life or bodily function.   MEDICATIONS ORDERED IN ED: Medications  LORazepam (ATIVAN) tablet 1 mg (1 mg Oral Given 04/13/22 1809)  potassium chloride SA (KLOR-CON M) CR tablet 40 mEq (40 mEq Oral Given 04/13/22 1911)    FINAL CLINICAL IMPRESSION(S) / ED DIAGNOSES   Final diagnoses:  Anxiety     Rx / DC Orders   ED Discharge Orders     None        Note:  This document was prepared using Dragon voice recognition software and may include unintentional dictation errors.   Nathaniel Man, MD 04/13/22 1950

## 2022-04-13 NOTE — ED Notes (Addendum)
Pt states she feels fuzzy headed, heart pounding, and weak legged. Feels shaky with standing. Takes labatolol BID and checks BP at home. States she called the OB and they recommended her coming to ER. Recently miscarried at 96 weeks and had to receive blood transfusion and later come in for blood clot in right leg and bilateral lungs. Pt states she has never had panic attacks before so was not sure if her s/s were related to BP or panic attacks

## 2022-04-15 ENCOUNTER — Telehealth: Payer: Self-pay

## 2022-04-15 NOTE — Telephone Encounter (Signed)
0

## 2022-04-18 ENCOUNTER — Ambulatory Visit (INDEPENDENT_AMBULATORY_CARE_PROVIDER_SITE_OTHER): Payer: Medicaid Other | Admitting: Obstetrics and Gynecology

## 2022-04-18 ENCOUNTER — Encounter: Payer: Self-pay | Admitting: Obstetrics and Gynecology

## 2022-04-18 DIAGNOSIS — R3915 Urgency of urination: Secondary | ICD-10-CM

## 2022-04-18 DIAGNOSIS — F419 Anxiety disorder, unspecified: Secondary | ICD-10-CM

## 2022-04-18 DIAGNOSIS — I10 Essential (primary) hypertension: Secondary | ICD-10-CM

## 2022-04-18 LAB — POCT URINALYSIS DIPSTICK
Bilirubin, UA: NEGATIVE
Glucose, UA: NEGATIVE
Ketones, UA: NEGATIVE
Nitrite, UA: NEGATIVE
Protein, UA: NEGATIVE
Spec Grav, UA: <=1.005 — AB (ref 1.010–1.025)
Urobilinogen, UA: 0.2 E.U./dL
pH, UA: 6 (ref 5.0–8.0)

## 2022-04-18 MED ORDER — HYDROXYZINE HCL 25 MG PO TABS: 25.0000 mg | ORAL_TABLET | Freq: Four times a day (QID) | ORAL | 2 refills | Status: DC | PRN

## 2022-04-18 NOTE — Progress Notes (Signed)
HPI:      [name] is a 32 y.o. [phone] who LMP was No LMP recorded. Patient has had an injection.  Subjective:   She presents today for her 6-week postpartum postop check.  She is taking her Eliquis for PE and DVT.  She is taking labetalol twice a day.  She is still having some elevated blood pressures.  She reports that her incision is healing has a very small amount of drainage with a tiny hole present but it is not red and not painful to her.  She says that she has been been experiencing significant anxiety.  She was previously taking Celexa and Zoloft but does not want to try those medicines.  She would like to try something as needed.    Hx: The following portions of the patient's history were reviewed and updated as appropriate:             She  has a past medical history of Acid reflux, Hemorrhoid (11/2015), and Tobacco abuse. She does not have any pertinent problems on file. She  has a past surgical history that includes Cesarean section (03/03/2022). Her family history includes Alcohol abuse in her maternal grandfather; Cancer (age of onset: 50) in her maternal grandmother; Congestive Heart Failure in her father; Factor V Leiden deficiency in her father; Heart disease in her father; Hypertension in her father, mother, and paternal grandfather; Migraines in her mother; Stroke in her father. She  reports that she has been smoking cigarettes. She has a 6.00 pack-year smoking history. She has never used smokeless tobacco. She reports that she does not drink alcohol and does not use drugs. She has a current medication list which includes the following prescription(s): apixaban, hydroxyzine, labetalol, [DISCONTINUED] albuterol, [DISCONTINUED] fluticasone, [DISCONTINUED] metoprolol tartrate, and [DISCONTINUED] omeprazole. She is allergic to penicillins.       Review of Systems:  Review of Systems  Constitutional: Denied constitutional symptoms, night sweats, recent illness, fatigue,  fever, insomnia and weight loss.  Eyes: Denied eye symptoms, eye pain, photophobia, vision change and visual disturbance.  Ears/Nose/Throat/Neck: Denied ear, nose, throat or neck symptoms, hearing loss, nasal discharge, sinus congestion and sore throat.  Cardiovascular: Denied cardiovascular symptoms, arrhythmia, chest pain/pressure, edema, exercise intolerance, orthopnea and palpitations.  Respiratory: Denied pulmonary symptoms, asthma, pleuritic pain, productive sputum, cough, dyspnea and wheezing.  Gastrointestinal: Denied, gastro-esophageal reflux, melena, nausea and vomiting.  Genitourinary: Denied genitourinary symptoms including symptomatic vaginal discharge, pelvic relaxation issues, and urinary complaints.  Musculoskeletal: Denied musculoskeletal symptoms, stiffness, swelling, muscle weakness and myalgia.  Dermatologic: Denied dermatology symptoms, rash and scar.  Neurologic: Denied neurology symptoms, dizziness, headache, neck pain and syncope.  Psychiatric: Denied psychiatric symptoms, anxiety and depression.  Endocrine: Denied endocrine symptoms including hot flashes and night sweats.   Meds:   Current Outpatient Medications on File Prior to Visit  Medication Sig Dispense Refill   apixaban (ELIQUIS) 5 MG TABS tablet Advised to take Eliquis 10 mg twice a day for 7 days followed by 5 mg twice daily for next 6 to 9 months for pulmonary embolism. 60 tablet 6   labetalol (NORMODYNE) 100 MG tablet TAKE 1 TABLET BY MOUTH TWICE A DAY 30 tablet 1   [DISCONTINUED] albuterol (VENTOLIN HFA) 108 (90 Base) MCG/ACT inhaler Inhale 2 puffs into the lungs every 6 (six) hours as needed for wheezing or shortness of breath. 8 g 0   [DISCONTINUED] fluticasone (FLONASE) 50 MCG/ACT nasal spray Place 2 sprays into both nostrils daily. 16 g 6   [  DISCONTINUED] metoprolol tartrate (LOPRESSOR) 25 MG tablet Take 0.5 tablets (12.5 mg total) by mouth 2 (two) times daily as needed (Palpitations). (Patient not  taking: Reported on 07/08/2019) 20 tablet 0   [DISCONTINUED] omeprazole (PRILOSEC) 20 MG capsule Take 1 capsule (20 mg total) by mouth daily. 30 capsule 0   No current facility-administered medications on file prior to visit.      Objective:     Vitals:   04/18/22 1128 04/18/22 1135  BP: (!) 150/88 (!) 150/106  Pulse: 69    Filed Weights   04/18/22 1128  Weight: 216 lb (98 kg)               Abdomen: Soft.  Non-tender.  No masses.  No HSM.  Incision/s: Intact.  Very small hole present minimal drainage.  Healing well.  No erythema.  No drainage.   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 Postpartum care following cesarean delivery   2. Anxiety   3. Urinary urgency   4. Primary hypertension     Culture performed on urine-possible UTI.  Wound healing well.  Expect resolution.       Plan:            1.  Wound care discussed.  2.  Increase labetalol to 200 twice daily  3.  Atarax for episodic anxiety  4.  Urine for C&S  Orders Orders Placed This Encounter  Procedures   POCT urinalysis dipstick     Meds ordered this encounter  Medications   hydrOXYzine (ATARAX) 25 MG tablet    Sig: Take 1 tablet (25 mg total) by mouth every  6 (six) hours as needed for itching.    Dispense:  30 tablet    Refill:  2      F/U  Return in about 2 weeks (around 05/02/2022).  Finis Bud, M.D. 04/18/2022 11:39 AM

## 2022-04-18 NOTE — Progress Notes (Signed)
Patient presents today for 6 week postpartum follow-up. Patient had a cesarean delivery on 03/03/22. Depo for birth control. She states trouble with anxiety and panic attacks lately. Continuing to take blood pressure medication bid. EPDS score of 14. GAD 12 . She states no other questions or concerns at this time.

## 2022-04-20 LAB — URINE CULTURE

## 2022-04-25 ENCOUNTER — Ambulatory Visit: Payer: Medicaid Other | Admitting: Obstetrics and Gynecology

## 2022-05-08 ENCOUNTER — Encounter: Payer: Self-pay | Admitting: Obstetrics and Gynecology

## 2022-05-08 ENCOUNTER — Ambulatory Visit (INDEPENDENT_AMBULATORY_CARE_PROVIDER_SITE_OTHER): Payer: Medicaid Other | Admitting: Obstetrics and Gynecology

## 2022-05-08 DIAGNOSIS — F419 Anxiety disorder, unspecified: Secondary | ICD-10-CM

## 2022-05-08 DIAGNOSIS — B372 Candidiasis of skin and nail: Secondary | ICD-10-CM

## 2022-05-08 NOTE — Progress Notes (Signed)
Patient presents today for a postpartum mood and blood pressure check. She is currently taking Labetalol 100 mg bid and feels better than before. Reports hydroxyzine makes her feel drowsier than she would like but only takes it PRN, EPDS 10. Patient also states her incision looks like there is still a small opening like before.

## 2022-05-08 NOTE — Progress Notes (Signed)
HPI:      [name] is a 32 y.o. [phone] who LMP was No LMP recorded. Patient has had an injection.  Subjective:   She presents today she says she is generally doing better.  Her mood is improving.  She has a rash near her incision she is concerned about.  She says it itches.  The hydroxyzine makes her drowsy but seems to be working on some level.  She continues to take her blood thinner medication.  She is taking labetalol as directed.    Hx: The following portions of the patient's history were reviewed and updated as appropriate:             She  has a past medical history of Acid reflux, Hemorrhoid (11/2015), and Tobacco abuse. She does not have any pertinent problems on file. She  has a past surgical history that includes Cesarean section (03/03/2022). Her family history includes Alcohol abuse in her maternal grandfather; Cancer (age of onset: 70) in her maternal grandmother; Congestive Heart Failure in her father; Factor V Leiden deficiency in her father; Heart disease in her father; Hypertension in her father, mother, and paternal grandfather; Migraines in her mother; Stroke in her father. She  reports that she has been smoking cigarettes. She has a 6.00 pack-year smoking history. She has never used smokeless tobacco. She reports that she does not drink alcohol and does not use drugs. She has a current medication list which includes the following prescription(s): apixaban, hydroxyzine, labetalol, [DISCONTINUED] albuterol, [DISCONTINUED] fluticasone, [DISCONTINUED] metoprolol tartrate, and [DISCONTINUED] omeprazole. She is allergic to penicillins.       Review of Systems:  Review of Systems  Constitutional: Denied constitutional symptoms, night sweats, recent illness, fatigue, fever, insomnia and weight loss.  Eyes: Denied eye symptoms, eye pain, photophobia, vision change and visual disturbance.  Ears/Nose/Throat/Neck: Denied ear, nose, throat or neck symptoms, hearing loss, nasal  discharge, sinus congestion and sore throat.  Cardiovascular: Denied cardiovascular symptoms, arrhythmia, chest pain/pressure, edema, exercise intolerance, orthopnea and palpitations.  Respiratory: Denied pulmonary symptoms, asthma, pleuritic pain, productive sputum, cough, dyspnea and wheezing.  Gastrointestinal: Denied, gastro-esophageal reflux, melena, nausea and vomiting.  Genitourinary: Denied genitourinary symptoms including symptomatic vaginal discharge, pelvic relaxation issues, and urinary complaints.  Musculoskeletal: Denied musculoskeletal symptoms, stiffness, swelling, muscle weakness and myalgia.  Dermatologic: Denied dermatology symptoms, rash and scar.  Neurologic: Denied neurology symptoms, dizziness, headache, neck pain and syncope.  Psychiatric: Denied psychiatric symptoms, anxiety and depression.  Endocrine: Denied endocrine symptoms including hot flashes and night sweats.   Meds:   Current Outpatient Medications on File Prior to Visit  Medication Sig Dispense Refill   apixaban (ELIQUIS) 5 MG TABS tablet Advised to take Eliquis 10 mg twice a day for 7 days followed by 5 mg twice daily for next 6 to 9 months for pulmonary embolism. 60 tablet 6   hydrOXYzine (ATARAX) 25 MG tablet Take 1 tablet (25 mg total) by mouth every 6 (six) hours as needed for itching. 30 tablet 2   labetalol (NORMODYNE) 100 MG tablet TAKE 1 TABLET BY MOUTH TWICE A DAY 30 tablet 1   [DISCONTINUED] albuterol (VENTOLIN HFA) 108 (90 Base) MCG/ACT inhaler Inhale 2 puffs into the lungs every 6 (six) hours as needed for wheezing or shortness of breath. 8 g 0   [DISCONTINUED] fluticasone (FLONASE) 50 MCG/ACT nasal spray Place 2 sprays into both nostrils daily. 16 g 6   [DISCONTINUED] metoprolol tartrate (LOPRESSOR) 25 MG tablet Take 0.5 tablets (12.5 mg  total) by mouth 2 (two) times daily as needed (Palpitations). (Patient not taking: Reported on 07/08/2019) 20 tablet 0   [DISCONTINUED] omeprazole (PRILOSEC) 20 MG  capsule Take 1 capsule (20 mg total) by mouth daily. 30 capsule 0   No current facility-administered medications on file prior to visit.      Objective:     Vitals:   05/08/22 1411 05/08/22 1423  BP: (!) 138/92 132/84  Pulse: 76    Filed Weights   05/08/22 1411  Weight: 208 lb 14.4 oz (94.8 kg)              Examination of her incision shows what appears to be a topical yeast infection.  Incision otherwise healing well.          Assessment:    [id number] Patient Active Problem List   Diagnosis Date Noted   Bilateral pulmonary embolism 03/20/2022   DVT (deep venous thrombosis) 03/20/2022   History of IUFD 03/20/2022   Essential hypertension 03/20/2022   Postpartum care following cesarean delivery 03/05/2022   Encounter for assessment for fetal demise 03/02/2022   Labor and delivery, indication for care 02/28/2022   Gestational hypertension, third trimester 02/06/2022   Gestational hypertension without significant proteinuria, antepartum 01/16/2022   Supervision of high risk pregnancy, antepartum 08/23/2021   History of premature delivery 04/29/2016   Overweight (BMI 25.0-29.9) 07/28/2015   Tobacco user 05/31/2014     1. Postpartum care following cesarean delivery   2. Anxiety   3. Moniliasis, cutaneous     Patient doing well with labetalol blood pressure is reasonable.  Anxiety improved with episodic medication   Plan:            1.  Continue labetalol  2.  Continue blood thinner medication and follow-up for pulmonary embolus  3.  Depo-Provera  4.  Hydroxyzine-patient may cut tablets in half  5.  Topical antifungal cream discussed. Orders No orders of the defined types were placed in this encounter.   No orders of the defined types were placed in this encounter.     F/U  Return in about 3 months (around 08/07/2022).  Finis Bud, M.D. 05/08/2022 2:53 PM

## 2022-05-13 ENCOUNTER — Other Ambulatory Visit: Payer: Self-pay

## 2022-05-13 ENCOUNTER — Encounter: Payer: Self-pay | Admitting: Obstetrics and Gynecology

## 2022-05-13 DIAGNOSIS — O133 Gestational [pregnancy-induced] hypertension without significant proteinuria, third trimester: Secondary | ICD-10-CM

## 2022-05-13 MED ORDER — LABETALOL HCL 100 MG PO TABS: 100.0000 mg | ORAL_TABLET | Freq: Two times a day (BID) | ORAL | 2 refills | Status: DC

## 2022-05-19 ENCOUNTER — Telehealth: Payer: Medicaid Other | Admitting: Family

## 2022-05-19 DIAGNOSIS — J069 Acute upper respiratory infection, unspecified: Secondary | ICD-10-CM | POA: Diagnosis not present

## 2022-05-19 MED ORDER — CETIRIZINE HCL 10 MG PO TABS: 10.0000 mg | ORAL_TABLET | Freq: Every day | ORAL | 1 refills | Status: DC

## 2022-05-19 MED ORDER — BENZONATATE 100 MG PO CAPS: 100.0000 mg | ORAL_CAPSULE | Freq: Three times a day (TID) | ORAL | 0 refills | Status: DC | PRN

## 2022-05-19 MED ORDER — FLUTICASONE PROPIONATE 50 MCG/ACT NA SUSP: 2.0000 | Freq: Every day | NASAL | 6 refills | Status: DC

## 2022-05-19 NOTE — Progress Notes (Signed)

## 2022-05-20 IMAGING — CR DG SHOULDER 2+V*L*
1 series · 3 of 3 positions shown · non-contrast
Comparison: None.

CLINICAL DATA: Left shoulder pain after lifting heavy object
yesterday.

EXAM:
LEFT SHOULDER - 2+ VIEW

[Series 1: dg shoulder right · 0.14mm/px · 3 of 3 slices shown]
[im 1/3]
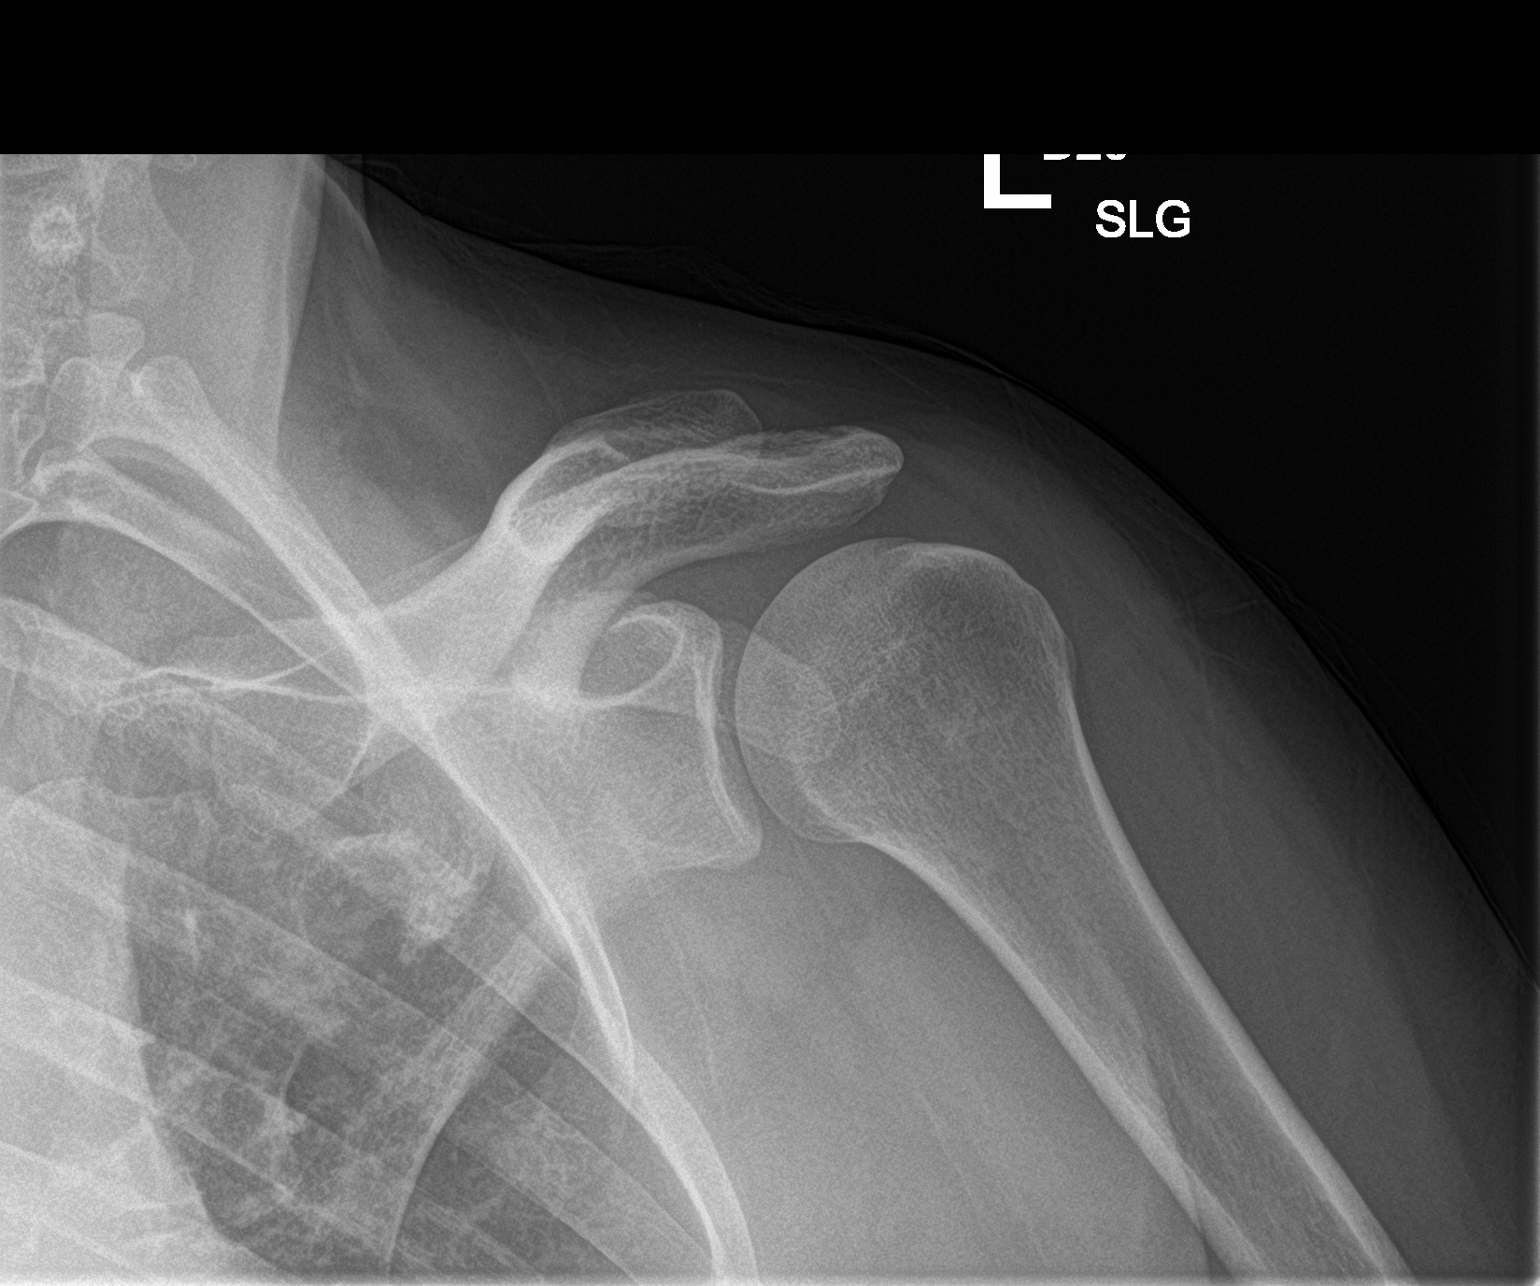
[im 2/3]
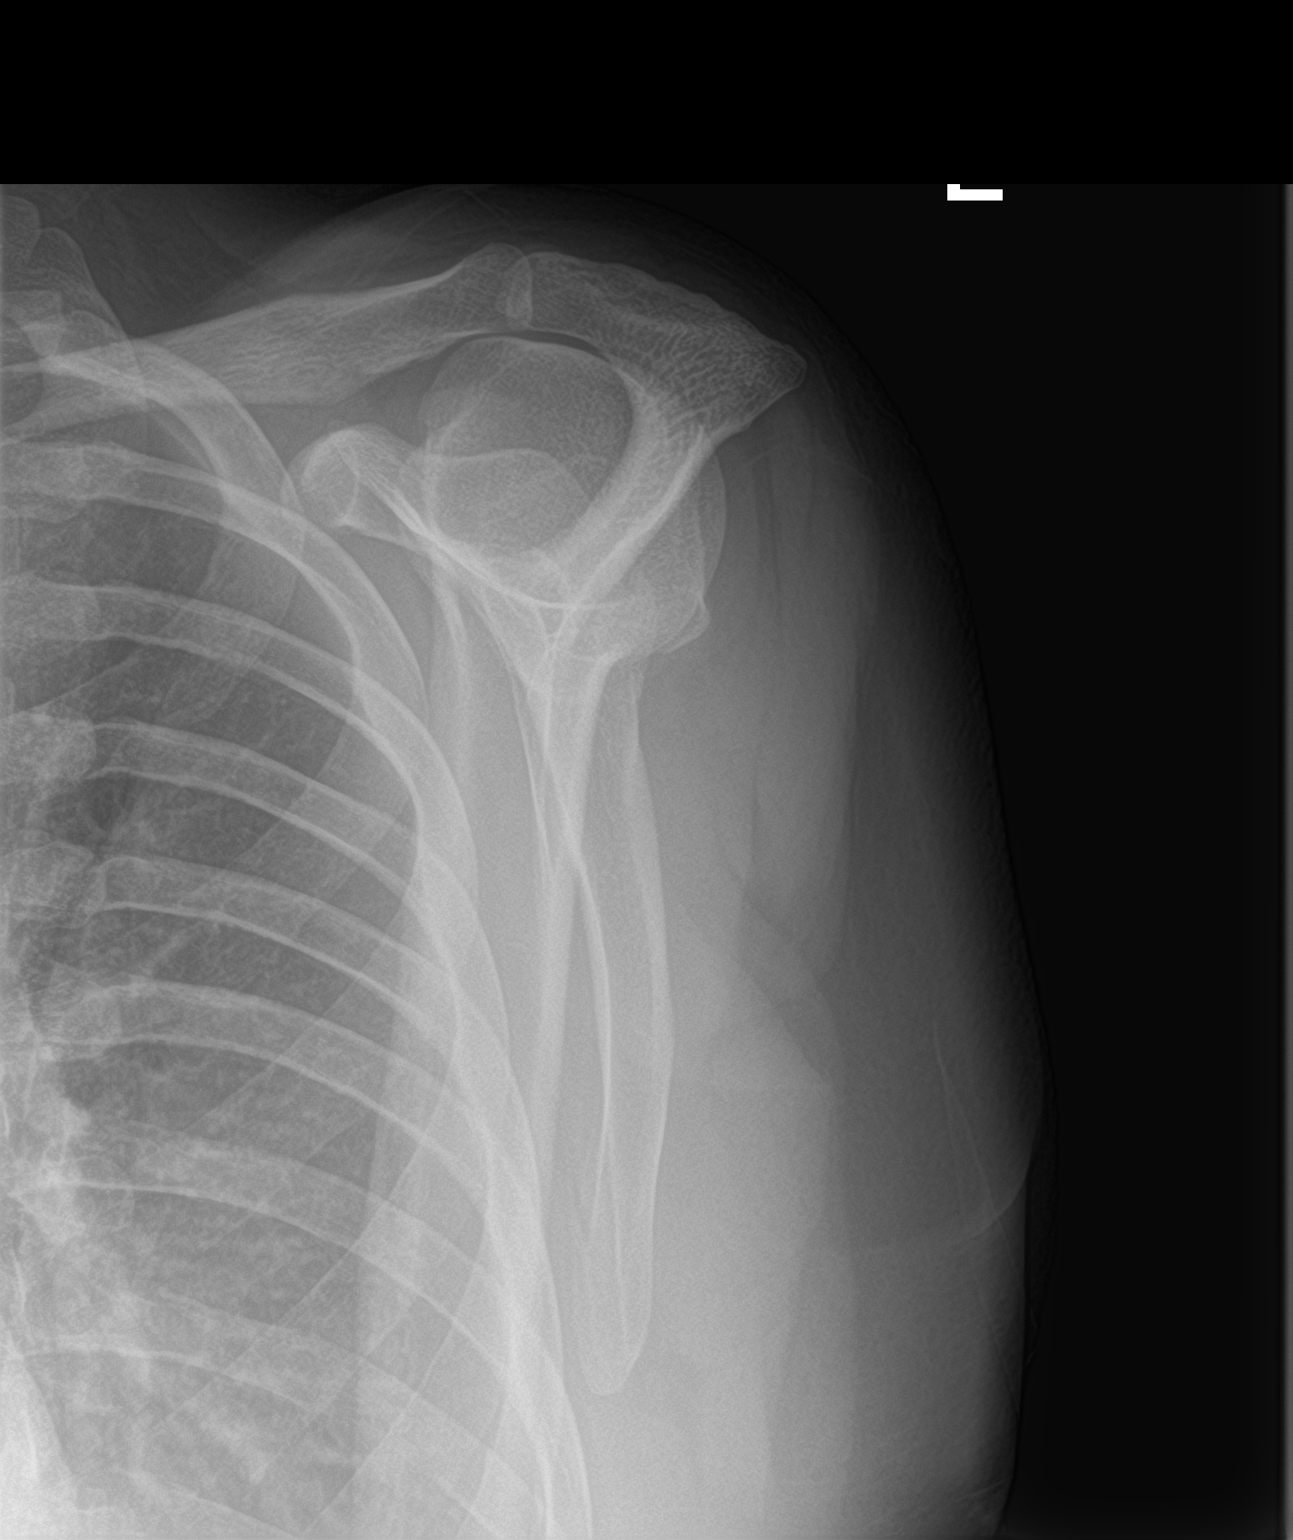
[im 3/3]
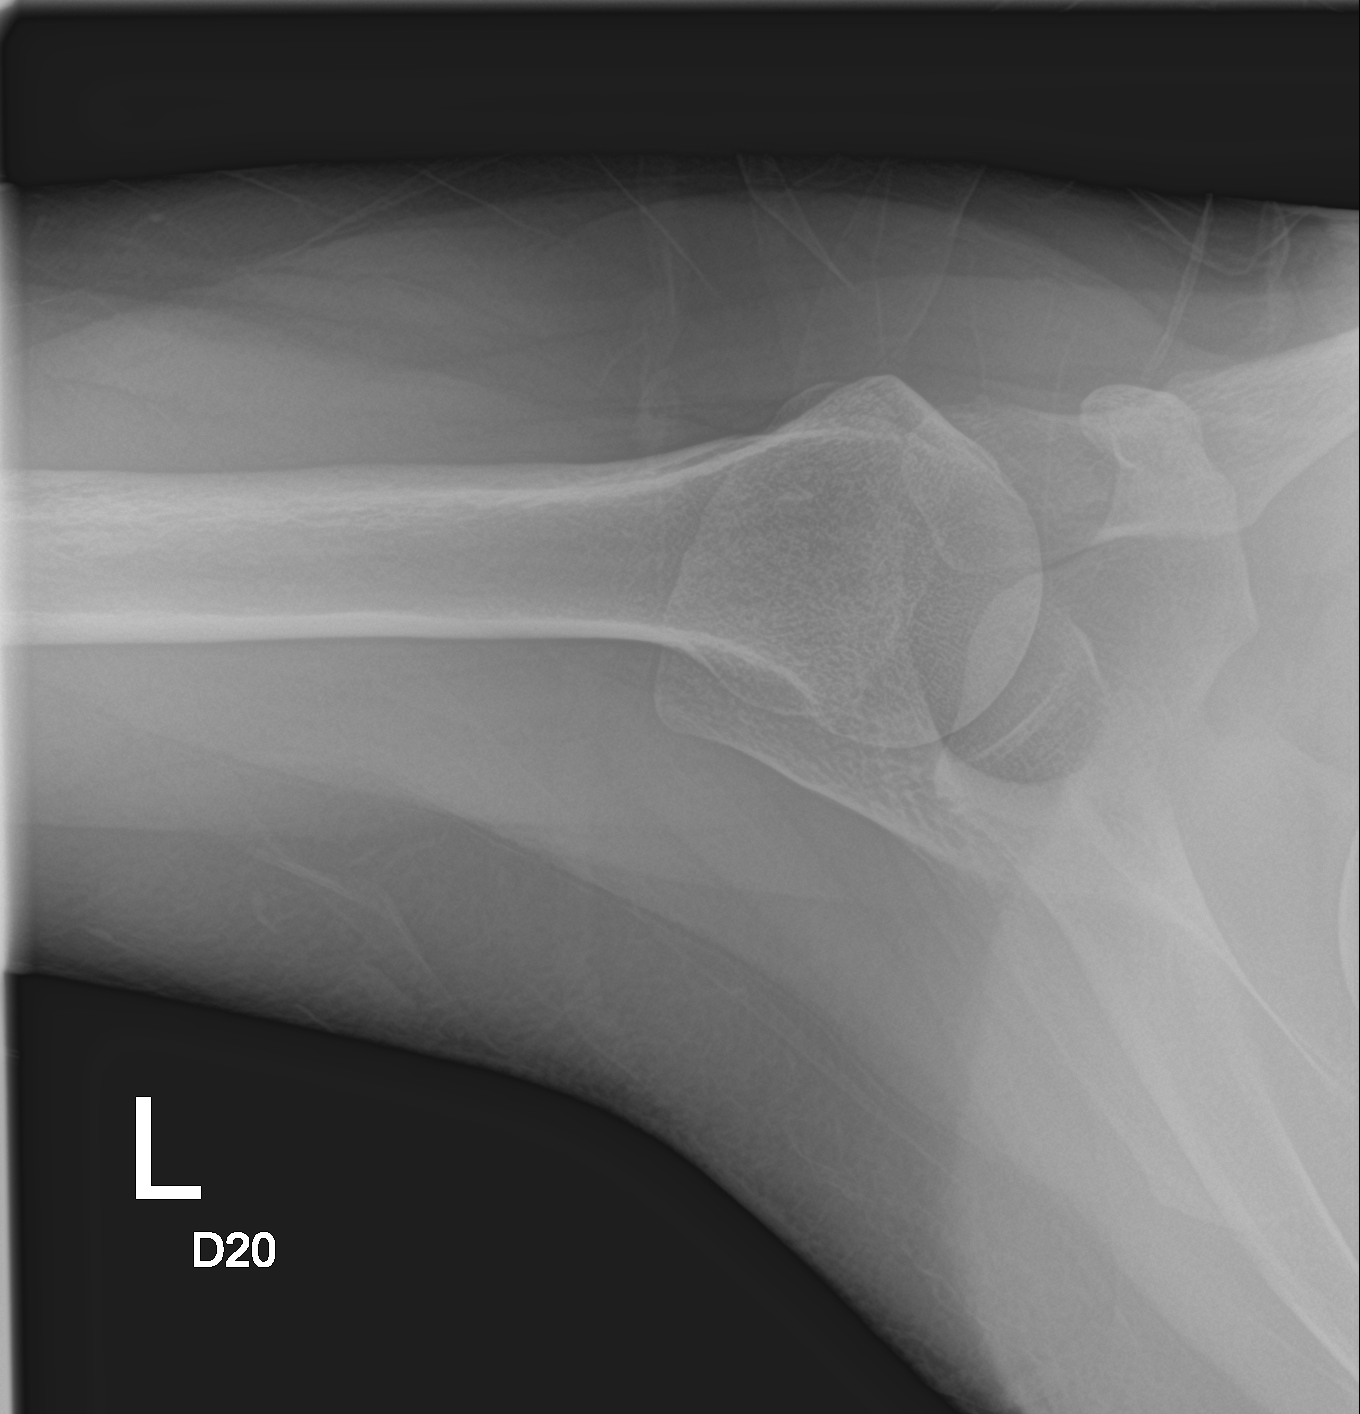

[3 of 3 positions shown; findings below may reference images not displayed]

FINDINGS: There is no evidence of fracture or dislocation. There is no
evidence of arthropathy or other focal bone abnormality. Soft
tissues are unremarkable.
IMPRESSION: Negative.

## 2022-05-26 ENCOUNTER — Other Ambulatory Visit: Payer: Self-pay

## 2022-05-26 ENCOUNTER — Encounter: Payer: Self-pay | Admitting: Intensive Care

## 2022-05-26 ENCOUNTER — Emergency Department: Payer: Medicaid Other

## 2022-05-26 ENCOUNTER — Emergency Department
Admission: EM | Admit: 2022-05-26 | Discharge: 2022-05-26 | Disposition: A | Discharge status: Home / Self Care | Payer: Medicaid Other | Attending: Emergency Medicine | Admitting: Emergency Medicine

## 2022-05-26 DIAGNOSIS — M79662 Pain in left lower leg: Secondary | ICD-10-CM | POA: Diagnosis present

## 2022-05-26 HISTORY — DX: Essential (primary) hypertension: I10

## 2022-05-26 LAB — CBC WITH DIFFERENTIAL/PLATELET
Abs Immature Granulocytes: 0.02 10*3/uL (ref 0.00–0.07)
Basophils Absolute: 0 10*3/uL (ref 0.0–0.1)
Basophils Relative: 0 %
Eosinophils Absolute: 0.1 10*3/uL (ref 0.0–0.5)
Eosinophils Relative: 1 %
HCT: 45 % (ref 36.0–46.0)
Hemoglobin: 14.5 g/dL (ref 12.0–15.0)
Immature Granulocytes: 0 %
Lymphocytes Relative: 43 %
Lymphs Abs: 2.9 10*3/uL (ref 0.7–4.0)
MCH: 27.4 pg (ref 26.0–34.0)
MCHC: 32.2 g/dL (ref 30.0–36.0)
MCV: 84.9 fL (ref 80.0–100.0)
Monocytes Absolute: 0.3 10*3/uL (ref 0.1–1.0)
Monocytes Relative: 4 %
Neutro Abs: 3.4 10*3/uL (ref 1.7–7.7)
Neutrophils Relative %: 52 %
Platelets: 281 10*3/uL (ref 150–400)
RBC: 5.3 MIL/uL — ABNORMAL HIGH (ref 3.87–5.11)
RDW: 14.3 % (ref 11.5–15.5)
WBC: 6.7 10*3/uL (ref 4.0–10.5)
nRBC: 0 % (ref 0.0–0.2)

## 2022-05-26 LAB — COMPREHENSIVE METABOLIC PANEL
ALT: 24 U/L (ref 0–44)
AST: 20 U/L (ref 15–41)
Albumin: 4.3 g/dL (ref 3.5–5.0)
Alkaline Phosphatase: 73 U/L (ref 38–126)
Anion gap: 10 (ref 5–15)
BUN: 7 mg/dL (ref 6–20)
CO2: 25 mmol/L (ref 22–32)
Calcium: 9.2 mg/dL (ref 8.9–10.3)
Chloride: 104 mmol/L (ref 98–111)
Creatinine, Ser: 0.73 mg/dL (ref 0.44–1.00)
GFR, Estimated: >60 mL/min (ref >60)
Glucose, Bld: 99 mg/dL (ref 70–99)
Potassium: 3.4 mmol/L — ABNORMAL LOW (ref 3.5–5.1)
Sodium: 139 mmol/L (ref 135–145)
Total Bilirubin: 0.3 mg/dL (ref 0.3–1.2)
Total Protein: 8.1 g/dL (ref 6.5–8.1)

## 2022-05-26 MED ORDER — POTASSIUM CHLORIDE CRYS ER 20 MEQ PO TBCR
40.0000 meq | EXTENDED_RELEASE_TABLET | Freq: Once | ORAL | Status: AC
  Administered 2022-05-26: 40 meq via ORAL
  Filled 2022-05-26: qty 2

## 2022-05-26 NOTE — ED Triage Notes (Signed)
Patient c/o aching pain in left calf.   Reports she had a baby 9 weeks ago. Two weeks after the baby was born, patient was diagnosed with blood clot in right leg and placed on blood thinner. Then reports clot traveled to her lungs.   Takes eliquis

## 2022-05-26 NOTE — ED Provider Notes (Signed)
[facility] Provider Note    Event Date/Time   First MD Initiated Contact with Patient 05/26/22 1637     (approximate)   History   Leg Pain   HPI  Jasmine Gray is a 32 y.o. female here for evaluation of discomfort in her left calf area for about 1 month  Since around 1 April she has noticed intermittently a little bit of an achy oral muscle feeling of a muscle strain in her left ankle.  It comes and goes, seems to be more noticeable when she sleeps on her left side.  No numbness or weakness in the foot.  Able to walk on it without difficulty.  Patient reports she wants to make sure she has not developed another blood clot because she has a history of DVT in the past but it was in her right leg  No issues with the right leg.  No shortness of breath She occasionally does feel little bit of achiness along her left ribs but attributes this to sleeping on her left side.  Does not feel like the pain and discomfort she experienced in her chest with breathing when she had her previous blood clot in her lungs.   OB note from April 3 to notes postpartum care following cesarean delivery.  However also noted is that the patient had bilateral pulmonary emboli and DVT in February , intrauterine demise, and is currently on Eliquis.  Patient also noted to be seen in the ER in March 9 with elevated blood pressure that resolved after use of lorazepam.  She is compliant with her Eliquis and takes it twice daily every day  Physical Exam   Triage Vital Signs: ED Triage Vitals  Enc Vitals Group     BP 05/26/22 1625 (!) 153/99     Pulse Rate 05/26/22 1625 (!) 104     Resp 05/26/22 1625 18     Temp 05/26/22 1625 98.5 F (36.9 C)     Temp Source 05/26/22 1625 Oral     SpO2 05/26/22 1625 100 %     Weight 05/26/22 1627 207 lb (93.9 kg)     Height 05/26/22 1627 5\' 7"  (1.702 m)     Head Circumference --      Peak Flow --      Pain Score 05/26/22 1627 4     Pain Loc --       Pain Edu? --      Excl. in GC? --     Most recent vital signs: Vitals:   05/26/22 1625  BP: (!) 153/99  Pulse: (!) 104  Resp: 18  Temp: 98.5 F (36.9 C)  SpO2: 100%     General: Awake, no distress.  CV:  Good peripheral perfusion.  Normal tones and clear lungs bilaterally.  Normal work of breathing and oxygen saturation Resp:  Normal effort.  Abd:  No distention.  Other:  Able to move left lower extremity without difficulty.  Warm well-perfused left foot.  Palpable dorsalis pedis and posterior tibial pulses.  Normal sensation and strength.  She does report a slight tightness feeling with plantarflexion of the left foot, causes a slight achiness in her left calf area.  The left calf itself appears normal.  No overlying lesions.  No rashes.  Normal examination of the knee ankle foot.  Denies any trauma or injury has occurred  No venous cords or congestion   ED Results / Procedures / Treatments   Labs (all labs  ordered are listed, but only abnormal results are displayed) Labs Reviewed  CBC WITH DIFFERENTIAL/PLATELET - Abnormal; Notable for the following components:      Result Value   RBC 5.30 (*)    All other components within normal limits  COMPREHENSIVE METABOLIC PANEL - Abnormal; Notable for the following components:   Potassium 3.4 (*)    All other components within normal limits    Radiologist notes no DVT left lower extremity   US Venous Img Lower Unilateral Left  Result Date: 05/26/2022 CLINICAL DATA:  Left calf pain EXAM: LEFT LOWER EXTREMITY VENOUS DOPPLER ULTRASOUND TECHNIQUE: Gray-scale sonography with graded compression, as well as color Doppler and duplex ultrasound were performed to evaluate the lower extremity deep venous systems from the level of the common femoral vein and including the common femoral, femoral, profunda femoral, popliteal and calf veins including the posterior tibial, peroneal and gastrocnemius veins when visible. The superficial great  saphenous vein was also interrogated. Spectral Doppler was utilized to evaluate flow at rest and with distal augmentation maneuvers in the common femoral, femoral and popliteal veins. COMPARISON:  None Available. FINDINGS: Contralateral Common Femoral Vein: Respiratory phasicity is normal and symmetric with the symptomatic side. No evidence of thrombus. Normal compressibility. Common Femoral Vein: No evidence of thrombus. Normal compressibility, respiratory phasicity and response to augmentation. Saphenofemoral Junction: No evidence of thrombus. Normal compressibility and flow on color Doppler imaging. Profunda Femoral Vein: No evidence of thrombus. Normal compressibility and flow on color Doppler imaging. Femoral Vein: No evidence of thrombus. Normal compressibility, respiratory phasicity and response to augmentation. Popliteal Vein: No evidence of thrombus. Normal compressibility, respiratory phasicity and response to augmentation. Calf Veins: No evidence of thrombus. Normal compressibility and flow on color Doppler imaging. Superficial Great Saphenous Vein: No evidence of thrombus. Normal compressibility. Venous Reflux:  None. Other Findings:  None. IMPRESSION: No evidence of deep venous thrombosis. Electronically Signed   By: Duanne Guess D.O.   On: 05/26/2022 17:59     PROCEDURES:  Critical Care performed: No  Procedures   MEDICATIONS ORDERED IN ED: Medications  potassium chloride SA (KLOR-CON M) CR tablet 40 mEq (40 mEq Oral Given 05/26/22 1807)     IMPRESSION / MDM / ASSESSMENT AND PLAN / ED COURSE  I reviewed the triage vital signs and the nursing notes.                              Differential diagnosis includes, but is not limited to, labs normal CBC, very mild hypokalemia which we will replete.  Very reassuring examination.  Compartments soft, gastrocnemius area without overlying lesion swelling or evidence of congestion.  Clinically unlikely to represent DVT.  No clinical signs  or symptoms of DVT or PE noted at this time.  Denies any issue with the right lower extremity, which also appears normal at this time.  Ultrasound of the left lower extremity negative for DVT  Discussed with patient, reassurance provided.  Will follow-up with Dr. Logan Bores and continue her current medication  Patient's presentation is most consistent with acute complicated illness / injury requiring diagnostic workup.  Return precautions and treatment recommendations and follow-up discussed with the patient who is agreeable with the plan.         FINAL CLINICAL IMPRESSION(S) / ED DIAGNOSES   Final diagnoses:  Pain of left calf     Rx / DC Orders   ED Discharge Orders     None  Note:  This document was prepared using Dragon voice recognition software and may include unintentional dictation errors.   Sharyn Creamer, MD 05/26/22 [phone]

## 2022-05-26 NOTE — Discharge Instructions (Signed)
Please continue use of your current medications including your Eliquis  Return to the emergency room right away if you develop leg swelling, numbness, cold or blue foot, severe pain, or other concerns arise.

## 2022-06-05 NOTE — Telephone Encounter (Signed)
error 

## 2022-06-19 ENCOUNTER — Telehealth: Payer: Self-pay | Admitting: Physician Assistant

## 2022-06-20 ENCOUNTER — Ambulatory Visit: Payer: Medicaid Other | Admitting: Physician Assistant

## 2022-06-20 ENCOUNTER — Encounter: Payer: Self-pay | Admitting: Physician Assistant

## 2022-06-20 VITALS — BP 134/90 | HR 80 | Ht 67.0 in | Wt 216.0 lb

## 2022-06-20 DIAGNOSIS — M5441 Lumbago with sciatica, right side: Secondary | ICD-10-CM

## 2022-06-20 DIAGNOSIS — I10 Essential (primary) hypertension: Secondary | ICD-10-CM

## 2022-06-20 DIAGNOSIS — Z86711 Personal history of pulmonary embolism: Secondary | ICD-10-CM

## 2022-06-20 DIAGNOSIS — F419 Anxiety disorder, unspecified: Secondary | ICD-10-CM | POA: Insufficient documentation

## 2022-06-20 DIAGNOSIS — F32A Depression, unspecified: Secondary | ICD-10-CM | POA: Insufficient documentation

## 2022-06-20 DIAGNOSIS — Z8759 Personal history of other complications of pregnancy, childbirth and the puerperium: Secondary | ICD-10-CM | POA: Insufficient documentation

## 2022-06-20 DIAGNOSIS — F4321 Adjustment disorder with depressed mood: Secondary | ICD-10-CM | POA: Diagnosis not present

## 2022-06-20 DIAGNOSIS — M5442 Lumbago with sciatica, left side: Secondary | ICD-10-CM

## 2022-06-20 DIAGNOSIS — Z832 Family history of diseases of the blood and blood-forming organs and certain disorders involving the immune mechanism: Secondary | ICD-10-CM

## 2022-06-20 MED ORDER — ESCITALOPRAM OXALATE 10 MG PO TABS
10.0000 mg | ORAL_TABLET | Freq: Every day | ORAL | 1 refills | Status: AC
Start: 2022-06-20 — End: ?

## 2022-06-20 MED ORDER — METHYLPREDNISOLONE 4 MG PO TBPK
ORAL_TABLET | ORAL | 0 refills | Status: DC
Start: 2022-06-20 — End: 2022-10-09

## 2022-06-20 NOTE — Assessment & Plan Note (Signed)
Borderline elevation but I think there is an element of white coat htn  Given recent trauma no thoughts of trying for another pregnancy currently, F/u 1 mo if in range I would stay on labetaolol for a few months, and if pt is done childbearing would switch to ace/arb

## 2022-06-20 NOTE — Patient Instructions (Addendum)
Therapy resources:  Virtual only therapy: [facility]  https://www.[facility].com/ http://[facility].biz/ https://www.[facility].com/  Emergency Mental Health Services:  [facility] (like an urgent care for mental health) [address], [place], [place] [address] [phone]  [facility] Several different mental health services, has a crisis center  [place], [place] [address], [place], [place] [address]  [phone] Same-Day Access Hours:  Monday, Wednesday and Friday, 8am - 3pm Crisis & Diversion Center: Walk-In Crisis Hours: 7 days/week, 8am - 12am -  [facility] [employer] -  Patent examiner Drop-Off (side door)

## 2022-06-20 NOTE — Progress Notes (Signed)
New patient visit   Patient: Jasmine Gray   DOB: Apr 23, 1990   31 y.o. Female  MRN: [id number] Visit Date: 06/20/2022  Today's healthcare provider: Alfredia Ferguson, PA-C   Cc. Htn, blood clots, anxiety/depression  Subjective    Jasmine Gray is a 32 y.o. female who presents today as a new patient to establish care.  HPI  Patient is being seen in regards to blood pressure and blood thinner.  She has a history of IUFD 7 w 1/24, s/p c section. 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 On Eliquis.   She reports left sided low back pain with radiating pain down her L leg.   Pt reports significant anxiety/depression since losing her child earlier this year. She is having a difficult time coping.      06/20/2022   10:27 AM  PHQ9 SCORE ONLY  PHQ-9 Total Score 7      06/20/2022   10:29 AM 04/18/2022   11:34 AM  GAD 7 : Generalized Anxiety Score  Nervous, Anxious, on Edge 3 3  Control/stop worrying 3 2  Worry too much - different things 3 2  Trouble relaxing 1 2  Restless 3 2  Easily annoyed or irritable 0 0  Afraid - awful might happen 1 1  Total GAD 7 Score 14 12  Anxiety Difficulty Not difficult at all        Past Medical History:  Diagnosis Date   Acid reflux    on no RX at present   Anemia 12/03/2022   Blood transfusion without reported diagnosis 03/03/2022   Depression 03/05/2022   Hemorrhoid 11/2015   starting on past Sat. Pt states it is on the outside   Hypertension    Tobacco abuse    Past Surgical History:  Procedure Laterality Date   CESAREAN SECTION  03/03/2022   Procedure: CESAREAN SECTION;  Surgeon: Linzie Collin, MD;  Location: [facility] ORS;  Service: Obstetrics;;   Family Status  Relation Name Status   Mother Olegario Messier Alive   Father [place] Deceased   Sister  Alive   Brother  Alive   MGM Billie Deceased   MGF Harvie Heck Deceased   PGM  Alive   PGF Randy Deceased   Neg Hx  (Not Specified)   Family History  Problem Relation Age of Onset   Hypertension Mother    Migraines  Mother    Heart disease Father    Hypertension Father    Congestive Heart Failure Father    Stroke Father    Factor V Leiden deficiency Father    Cancer Maternal Grandmother 27       breast   Alcohol abuse Maternal Grandfather    Hypertension Paternal Grandfather    Depression Neg Hx    Social History   Socioeconomic History   Marital status: Significant Other    Spouse name: Not on file   Number of children: 2   Years of education: 12   Highest education level: Not on file  Occupational History   Occupation: stay at home mom  Tobacco Use   Smoking status: Every Day    Packs/day: 0.50    Years: 10.00    Additional pack years: 0.00    Total pack years: 5.00    Types: Cigarettes   Smokeless tobacco: Never  Vaping Use   Vaping Use: Never used  Substance and Sexual Activity   Alcohol use: Never   Drug use: Never   Sexual activity: Yes    Partners: Male  Birth control/protection: None  Other Topics Concern   Not on file  Social History Narrative   Not on file   Social Determinants of Health   Financial Resource Strain: Medium Risk (08/23/2021)   Overall Financial Resource Strain (CARDIA)    Difficulty of Paying Living Expenses: Somewhat hard  Food Insecurity: No Food Insecurity (03/20/2022)   Hunger Vital Sign    Worried About Running Out of Food in the Last Year: Never true    Ran Out of Food in the Last Year: Never true  Transportation Needs: No Transportation Needs (03/20/2022)   PRAPARE - Administrator, Civil Service (Medical): No    Lack of Transportation (Non-Medical): No  Physical Activity: Insufficiently Active (08/23/2021)   Exercise Vital Sign    Days of Exercise per Week: 2 days    Minutes of Exercise per Session: 40 min  Stress: No Stress Concern Present (08/23/2021)   [employer] of Occupational Health - Occupational Stress Questionnaire    Feeling of Stress : Not at all  Social Connections: Unknown (08/23/2021)   Social Connection  and Isolation Panel [NHANES]    Frequency of Communication with Friends and Family: More than three times a week    Frequency of Social Gatherings with Friends and Family: More than three times a week    Attends Religious Services: Never    Database administrator or Organizations: No    Attends Banker Meetings: Never    Marital Status: Not on file   Outpatient Medications Prior to Visit  Medication Sig   apixaban (ELIQUIS) 5 MG TABS tablet Advised to take Eliquis 10 mg twice a day for 7 days followed by 5 mg twice daily for next 6 to 9 months for pulmonary embolism.   hydrOXYzine (ATARAX) 25 MG tablet Take 1 tablet (25 mg total) by mouth every 6 (six) hours as needed for itching.   labetalol (NORMODYNE) 100 MG tablet Take 1 tablet (100 mg total) by mouth 2 (two) times daily.   [DISCONTINUED] benzonatate (TESSALON PERLES) 100 MG capsule Take 1 capsule (100 mg total) by mouth 3 (three) times daily as needed.   [DISCONTINUED] cetirizine (ZYRTEC ALLERGY) 10 MG tablet Take 1 tablet (10 mg total) by mouth daily.   [DISCONTINUED] fluticasone (FLONASE) 50 MCG/ACT nasal spray Place 2 sprays into both nostrils daily.   No facility-administered medications prior to visit.   Allergies  Allergen Reactions   Penicillins Hives    Immunization History  Administered Date(s) Administered   Influenza,inj,Quad PF,6+ Mos 10/25/2015   Tdap 11/24/2015, 02/06/2022    Health Maintenance  Topic Date Due   COVID-19 Vaccine (1) Never done   INFLUENZA VACCINE  09/05/2022   PAP SMEAR-Modifier  09/28/2022   DTaP/Tdap/Td (3 - Td or Tdap) 02/07/2032   Hepatitis C Screening  Completed   HIV Screening  Completed   HPV VACCINES  Aged Out    Patient Care Team: Alfredia Ferguson, PA-C as PCP - General (Physician Assistant)  Review of Systems  Constitutional:  Negative for fatigue and fever.  Respiratory:  Negative for cough and shortness of breath.   Cardiovascular:  Negative for chest pain and  leg swelling.  Gastrointestinal:  Negative for abdominal pain.  Musculoskeletal:  Positive for back pain.  Neurological:  Negative for dizziness and headaches.  Psychiatric/Behavioral:  The patient is nervous/anxious.      Objective    BP (!) 134/90 (BP Location: Left Arm, Patient Position: Sitting)   Pulse 80  Ht 5\' 7"  (1.702 m)   Wt 216 lb (98 kg)   SpO2 100%   BMI 33.83 kg/m   Physical Exam Constitutional:      General: She is awake.     Appearance: She is well-developed.  HENT:     Head: Normocephalic.  Eyes:     Conjunctiva/sclera: Conjunctivae normal.  Cardiovascular:     Rate and Rhythm: Normal rate and regular rhythm.     Heart sounds: Normal heart sounds.  Pulmonary:     Effort: Pulmonary effort is normal.     Breath sounds: Normal breath sounds.  Skin:    General: Skin is warm.  Neurological:     Mental Status: She is alert and oriented to person, place, and time.  Psychiatric:        Attention and Perception: Attention normal.        Mood and Affect: Mood normal.        Speech: Speech normal.        Behavior: Behavior is cooperative.    Depression Screen    06/20/2022   10:27 AM  PHQ 2/9 Scores  PHQ - 2 Score 2  PHQ- 9 Score 7   No results found for any visits on 06/20/22.  Assessment & Plan      Problem List Items Addressed This Visit       Cardiovascular and Mediastinum   Essential hypertension (Chronic)    Borderline elevation but I think there is an element of white coat htn  Given recent trauma no thoughts of trying for another pregnancy currently, F/u 1 mo if in range I would stay on labetaolol for a few months, and if pt is done childbearing would switch to ace/arb        Other   History of pulmonary embolus (PE)    Thought to be provoked post op.  On eliquis.  Reassured low risk of clot on anticoag. Pt's father has factor v leiden. Pt reports she has never been tested.      Relevant Orders   Ambulatory referral to Hematology  / Oncology   Anxiety and depression - Primary    Encouraged pt see a therapist-- referral placed and local resources given Starting on lexapro 10 mg advised SE, f/u 4-6 weeks      Relevant Medications   escitalopram (LEXAPRO) 10 MG tablet   Other Relevant Orders   Ambulatory referral to Psychology   Other Visit Diagnoses     Grief       Relevant Medications   escitalopram (LEXAPRO) 10 MG tablet   Other Relevant Orders   Ambulatory referral to Psychology   Acute left-sided low back pain with bilateral sciatica       Relevant Medications   escitalopram (LEXAPRO) 10 MG tablet   methylPREDNISolone (MEDROL DOSEPAK) 4 MG TBPK tablet   Family history of factor V Leiden mutation       Relevant Orders   Ambulatory referral to Hematology / Oncology       3. Acute left-sided low back pain with bilateral sciatica Trial of steroid pack for inflammation/pain relief. - methylPREDNISolone (MEDROL DOSEPAK) 4 MG TBPK tablet; Take 6 pills on day 1, 5 pills on day 2, 4 pills on day 3, 3 pills on day 4, 2 pills on day 5, and 1 pill on day 6  Dispense: 1 each; Refill: 0  Return in about 6 weeks (around 08/01/2022) for anxiety, depression.    I, Alfredia Ferguson, PA-C have reviewed  all documentation for this visit. The documentation on  06/20/22   for the exam, diagnosis, procedures, and orders are all accurate and complete.  Alfredia Ferguson, PA-C [facility] [address] [place], [place], [address] Office: [phone] Fax: [phone]   [facility]

## 2022-06-20 NOTE — Assessment & Plan Note (Signed)
Thought to be provoked post op.  On eliquis.  Reassured low risk of clot on anticoag. Pt's father has factor v leiden. Pt reports she has never been tested.

## 2022-06-20 NOTE — Assessment & Plan Note (Signed)
Encouraged pt see a therapist-- referral placed and local resources given Starting on lexapro 10 mg advised SE, f/u 4-6 weeks

## 2022-06-28 ENCOUNTER — Inpatient Hospital Stay: Payer: Medicaid Other

## 2022-06-28 ENCOUNTER — Inpatient Hospital Stay: Payer: Medicaid Other | Attending: Internal Medicine | Admitting: Internal Medicine

## 2022-07-22 ENCOUNTER — Ambulatory Visit: Payer: Medicaid Other | Admitting: Physician Assistant

## 2022-07-22 NOTE — Progress Notes (Deleted)
I,Kianna Billet,acting as a Neurosurgeon for [employer], PA-C.,have documented all relevant documentation on the behalf of Alfredia Ferguson, PA-C,as directed by  Alfredia Ferguson, PA-C while in the presence of Alfredia Ferguson, PA-C.   Established patient visit   Patient: Jasmine Gray   DOB: [birth year]   32 y.o. Female  MRN: [id number] Visit Date: 07/22/2022  Today's healthcare provider: Alfredia Ferguson, PA-C   No chief complaint on file.  Subjective    HPI  Hypertension, follow-up  BP Readings from Last 3 Encounters:  06/20/22 (!) 134/90  05/26/22 127/82  05/08/22 132/84   Wt Readings from Last 3 Encounters:  06/20/22 216 lb (98 kg)  05/26/22 207 lb (93.9 kg)  05/08/22 208 lb 14.4 oz (94.8 kg)     She was last seen for hypertension 1 months ago.  BP at that visit was 134/90. Management since that visit includes ***.  She reports {excellent/good/fair/poor:19665} compliance with treatment. She {is/is not:9024} having side effects. {document side effects if present:1} She is following a {diet:21022986} diet. She {is/is not:9024} exercising. She {does/does not:200015} smoke.  Use of agents associated with hypertension: {bp agents assoc with hypertension:511::"none"}.   Outside blood pressures are {***enter patient reported home BP readings, or 'not being checked':1}. Symptoms: {Yes/No:20286} chest pain {Yes/No:20286} chest pressure  {Yes/No:20286} palpitations {Yes/No:20286} syncope  {Yes/No:20286} dyspnea {Yes/No:20286} orthopnea  {Yes/No:20286} paroxysmal nocturnal dyspnea {Yes/No:20286} lower extremity edema   Pertinent labs No results found for: "CHOL", "HDL", "LDLCALC", "LDLDIRECT", "TRIG", "CHOLHDL" Lab Results  Component Value Date   NA 139 05/26/2022   K 3.4 (L) 05/26/2022   CREATININE 0.73 05/26/2022   GFRNONAA >60 05/26/2022   GLUCOSE 99 05/26/2022     The ASCVD Risk score (Arnett DK, et al., 2019) failed to calculate for the following reasons:   The 2019  ASCVD risk score is only valid for ages 90 to 39  ---------------------------------------------------------------------------------------------------  Anxiety, Follow-up  She was last seen for anxiety 1 months ago. Changes made at last visit include lexapro 10 mg.   She reports {excellent/good/fair/poor:19665} compliance with treatment. She reports {good/fair/poor:18685} tolerance of treatment. She {is/is not:21021397} having side effects. {document side effects if present:1}  She feels her anxiety is {Desc; severity:60313} and {improved/worse/unchanged:3041574} since last visit.  Symptoms: {Yes/No:20286} chest pain {Yes/No:20286} difficulty concentrating  {Yes/No:20286} dizziness {Yes/No:20286} fatigue  {Yes/No:20286} feelings of losing control {Yes/No:20286} insomnia  {Yes/No:20286} irritable {Yes/No:20286} palpitations  {Yes/No:20286} panic attacks {Yes/No:20286} racing thoughts  {Yes/No:20286} shortness of breath {Yes/No:20286} sweating  {Yes/No:20286} tremors/shakes    GAD-7 Results    06/20/2022   10:29 AM 04/18/2022   11:34 AM  GAD-7 Generalized Anxiety Disorder Screening Tool  1. Feeling Nervous, Anxious, or on Edge 3 3  2. Not Being Able to Stop or Control Worrying 3 2  3. Worrying Too Much About Different Things 3 2  4. Trouble Relaxing 1 2  5. Being So Restless it's Hard To Sit Still 3 2  6. Becoming Easily Annoyed or Irritable 0 0  7. Feeling Afraid As If Something Awful Might Happen 1 1  Total GAD-7 Score 14 12  Difficulty At Work, Home, or Getting  Along With Others? Not difficult at all     PHQ-9 Scores    06/20/2022   10:27 AM  PHQ9 SCORE ONLY  PHQ-9 Total Score 7    ---------------------------------------------------------------------------------------------------  Depression, Follow-up  She  was last seen for this 1 months ago. Changes made at last visit include lexapro 10 mg.  She reports {excellent/good/fair/poor:19665} compliance with  treatment. She {is/is not:21021397} having side effects. ***  She reports {DESC; GOOD/FAIR/POOR:18685} tolerance of treatment. Current symptoms include: {Symptoms; depression:1002} She feels she is {improved/worse/unchanged:3041574} since last visit.     06/20/2022   10:27 AM  Depression screen PHQ 2/9  Decreased Interest 0  Down, Depressed, Hopeless 2  PHQ - 2 Score 2  Altered sleeping 1  Tired, decreased energy 3  Change in appetite 0  Feeling bad or failure about yourself  0  Trouble concentrating 1  Moving slowly or fidgety/restless 0  Suicidal thoughts 0  PHQ-9 Score 7  Difficult doing work/chores Not difficult at all    -----------------------------------------------------------------------------------------   Medications: Outpatient Medications Prior to Visit  Medication Sig   apixaban (ELIQUIS) 5 MG TABS tablet Advised to take Eliquis 10 mg twice a day for 7 days followed by 5 mg twice daily for next 6 to 9 months for pulmonary embolism.   escitalopram (LEXAPRO) 10 MG tablet Take 1 tablet (10 mg total) by mouth daily.   hydrOXYzine (ATARAX) 25 MG tablet Take 1 tablet (25 mg total) by mouth every 6 (six) hours as needed for itching.   labetalol (NORMODYNE) 100 MG tablet Take 1 tablet (100 mg total) by mouth 2 (two) times daily.   methylPREDNISolone (MEDROL DOSEPAK) 4 MG TBPK tablet Take 6 pills on day 1, 5 pills on day 2, 4 pills on day 3, 3 pills on day 4, 2 pills on day 5, and 1 pill on day 6   No facility-administered medications prior to visit.    Review of Systems  {Labs  Heme  Chem  Endocrine  Serology  Results Review (optional):23779}   Objective    There were no vitals taken for this visit. {Show previous Michio Thier signs (optional):23777}  Physical Exam  ***  No results found for any visits on 07/22/22.  Assessment & Plan     ***  No follow-ups on file.      {provider attestation***:1}   Alfredia Ferguson, PA-C  [facility] [phone] (phone) [phone] (fax)  [facility]

## 2022-08-10 ENCOUNTER — Other Ambulatory Visit: Payer: Self-pay | Admitting: Obstetrics and Gynecology

## 2022-08-10 DIAGNOSIS — O133 Gestational [pregnancy-induced] hypertension without significant proteinuria, third trimester: Secondary | ICD-10-CM

## 2022-08-28 IMAGING — CR DG CHEST 2V
1 series · 2 of 2 positions shown · non-contrast
Comparison: 03/29/2019

CLINICAL DATA: Chest pain and shortness of breath

EXAM:
CHEST - 2 VIEW

[Series 1: w chest pa · 0.14mm/px · 2 of 2 slices shown]
[im 1/2]
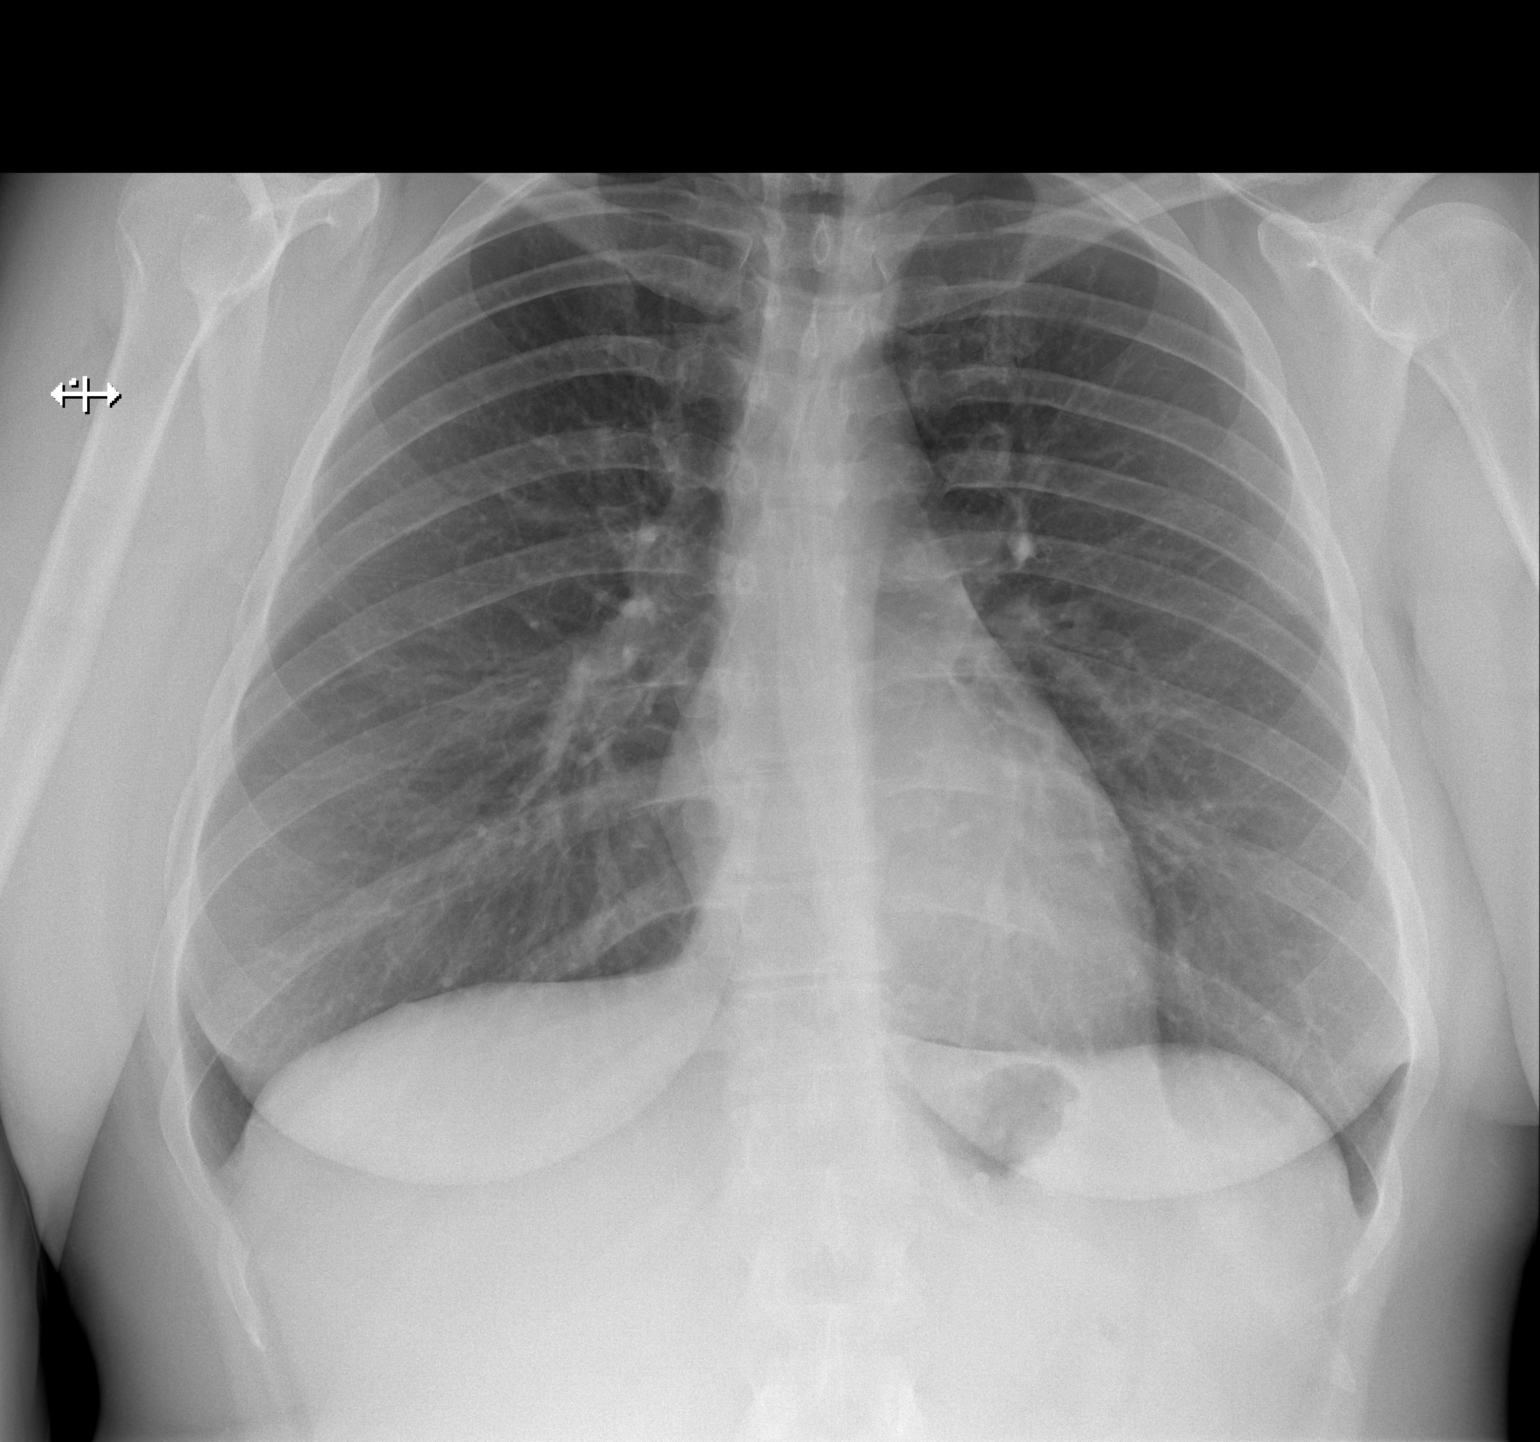
[im 2/2]
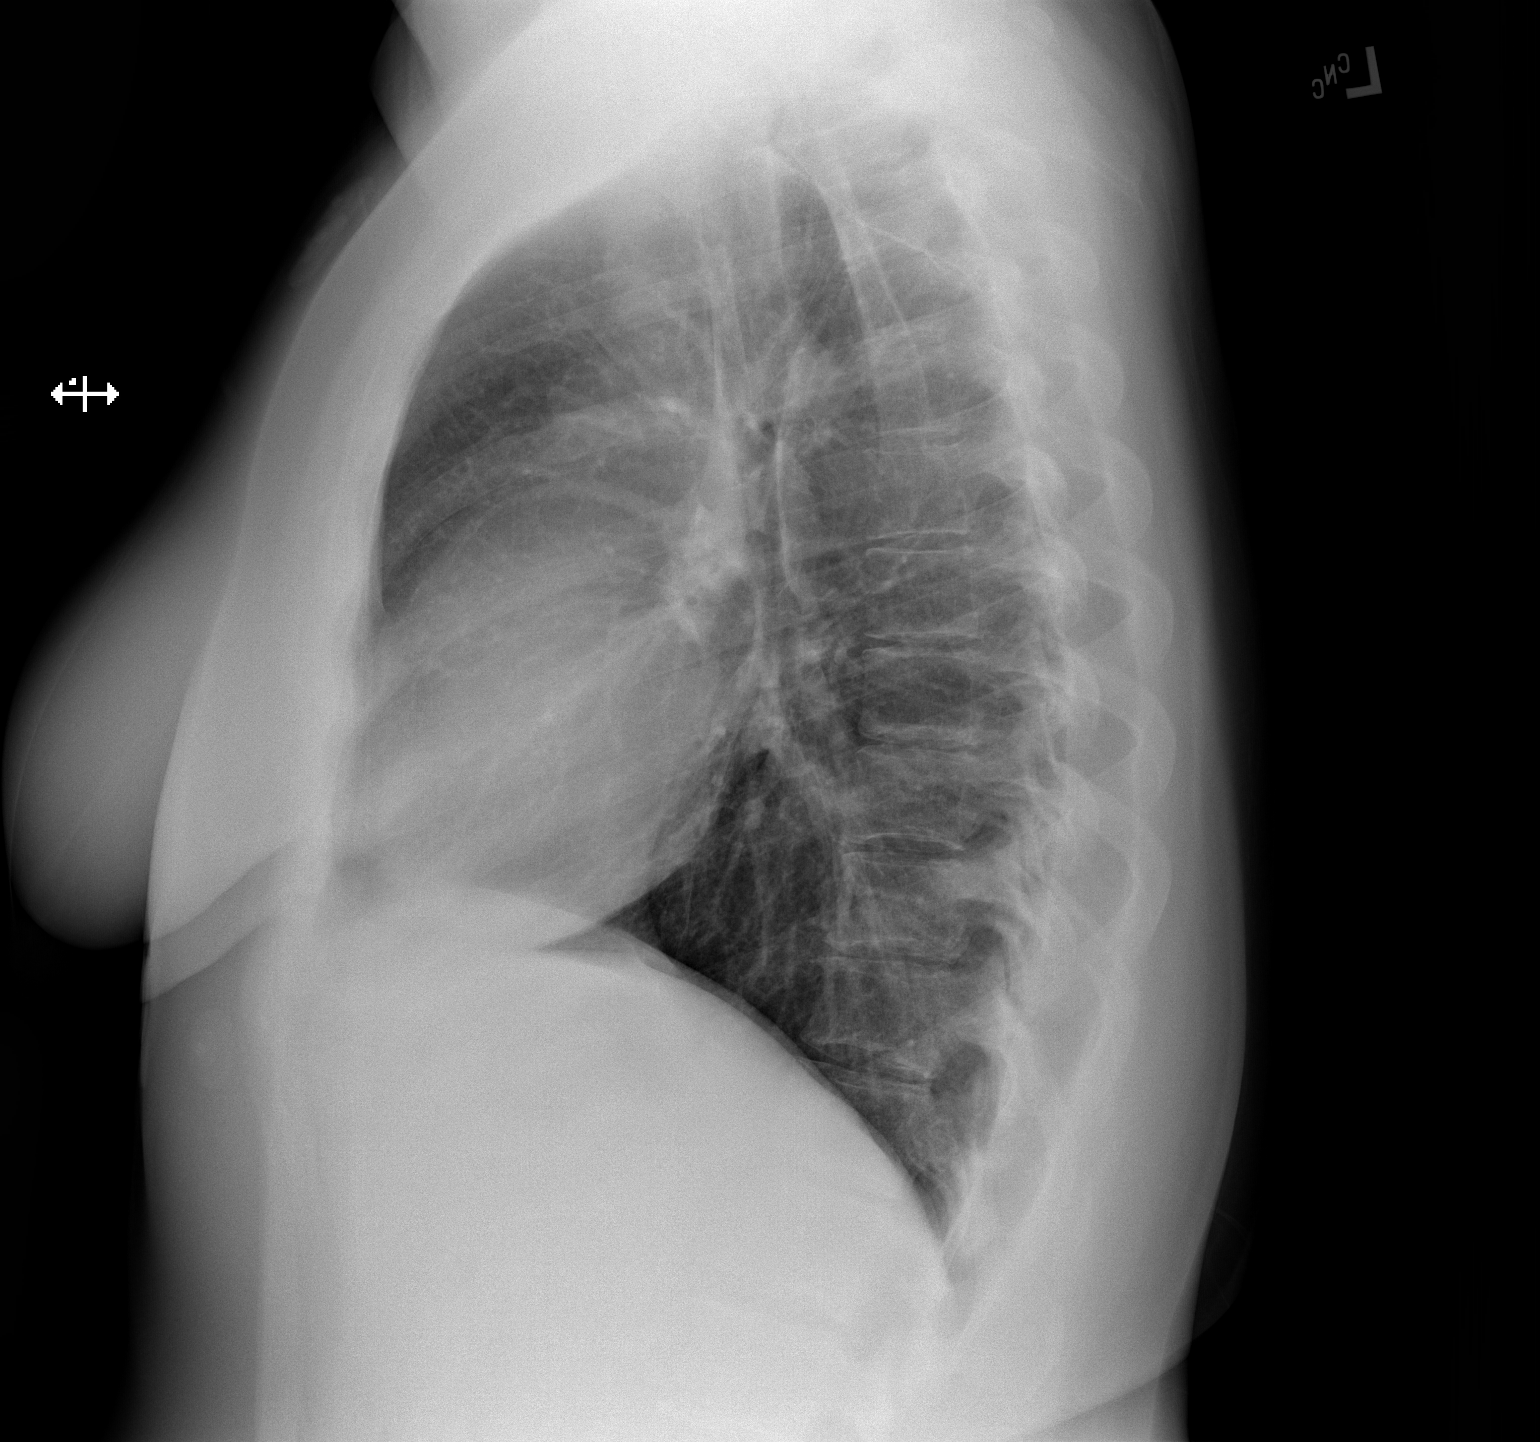

[2 of 2 positions shown; findings below may reference images not displayed]

FINDINGS: The heart size and mediastinal contours are within normal limits.
Both lungs are clear. The visualized skeletal structures are
unremarkable.
IMPRESSION: No active cardiopulmonary disease.

## 2022-09-04 ENCOUNTER — Telehealth: Payer: Medicaid Other | Admitting: Family

## 2022-09-04 DIAGNOSIS — R399 Unspecified symptoms and signs involving the genitourinary system: Secondary | ICD-10-CM | POA: Diagnosis not present

## 2022-09-04 MED ORDER — NITROFURANTOIN MONOHYD MACRO 100 MG PO CAPS: 100.0000 mg | ORAL_CAPSULE | Freq: Two times a day (BID) | ORAL | 0 refills | Status: DC

## 2022-09-04 NOTE — Progress Notes (Signed)
E-Visit for Urinary Problems  We are sorry that you are not feeling well.  Here is how we plan to help!  Based on what you shared with me it looks like you most likely have a simple urinary tract infection.  A UTI (Urinary Tract Infection) is a bacterial infection of the bladder.  Most cases of urinary tract infections are simple to treat but a key part of your care is to encourage you to drink plenty of fluids and watch your symptoms carefully.  I have prescribed MacroBid 100 mg twice a day for 7 days.  Your symptoms should gradually improve. Call us if the burning in your urine worsens, you develop worsening fever, back pain or pelvic pain or if your symptoms do not resolve after completing the antibiotic.  Urinary tract infections can be prevented by drinking plenty of water to keep your body hydrated.  Also be sure when you wipe, wipe from front to back and don't hold it in!  If possible, empty your bladder every 4 hours.  HOME CARE Drink plenty of fluids Compete the full course of the antibiotics even if the symptoms resolve Remember, when you need to go.go. Holding in your urine can increase the likelihood of getting a UTI! GET HELP RIGHT AWAY IF: You cannot urinate You get a high fever Worsening back pain occurs You see blood in your urine You feel sick to your stomach or throw up You feel like you are going to pass out  MAKE SURE YOU  Understand these instructions. Will watch your condition. Will get help right away if you are not doing well or get worse.   Thank you for choosing an e-visit.  Your e-visit answers were reviewed by a board certified advanced clinical practitioner to complete your personal care plan. Depending upon the condition, your plan could have included both over the counter or prescription medications.  Please review your pharmacy choice. Make sure the pharmacy is open so you can pick up prescription now. If there is a problem, you may contact your  provider through MyChart messaging and have the prescription routed to another pharmacy.  Your safety is important to us. If you have drug allergies check your prescription carefully.   For the next 24 hours you can use MyChart to ask questions about today's visit, request a non-urgent call back, or ask for a work or school excuse. You will get an email in the next two days asking about your experience. I hope that your e-visit has been valuable and will speed your recovery.   Approximately 5 minutes was spent documenting and reviewing patient's chart.   

## 2022-09-16 ENCOUNTER — Telehealth: Payer: Medicaid Other | Admitting: Family Medicine

## 2022-09-16 DIAGNOSIS — T3695XA Adverse effect of unspecified systemic antibiotic, initial encounter: Secondary | ICD-10-CM | POA: Diagnosis not present

## 2022-09-16 DIAGNOSIS — B379 Candidiasis, unspecified: Secondary | ICD-10-CM

## 2022-09-17 MED ORDER — FLUCONAZOLE 150 MG PO TABS
150.0000 mg | ORAL_TABLET | Freq: Once | ORAL | 0 refills | Status: AC
Start: 2022-09-17 — End: 2022-09-17

## 2022-09-17 NOTE — Progress Notes (Signed)

## 2022-10-09 ENCOUNTER — Other Ambulatory Visit: Payer: Self-pay | Admitting: Obstetrics and Gynecology

## 2022-10-09 ENCOUNTER — Telehealth: Payer: Medicaid Other | Admitting: Physician Assistant

## 2022-10-09 DIAGNOSIS — O133 Gestational [pregnancy-induced] hypertension without significant proteinuria, third trimester: Secondary | ICD-10-CM

## 2022-10-09 DIAGNOSIS — R3989 Other symptoms and signs involving the genitourinary system: Secondary | ICD-10-CM

## 2022-10-09 MED ORDER — CEPHALEXIN 500 MG PO CAPS: 500.0000 mg | ORAL_CAPSULE | Freq: Two times a day (BID) | ORAL | 0 refills | Status: AC

## 2022-10-09 NOTE — Progress Notes (Signed)

## 2022-10-09 NOTE — Progress Notes (Signed)
I have spent 5 minutes in review of e-visit questionnaire, review and updating patient chart, medical decision making and response to patient.   William Cody Martin, PA-C    

## 2022-10-18 ENCOUNTER — Telehealth: Payer: Medicaid Other | Admitting: Nurse Practitioner

## 2022-10-18 DIAGNOSIS — B379 Candidiasis, unspecified: Secondary | ICD-10-CM

## 2022-10-18 DIAGNOSIS — T3695XA Adverse effect of unspecified systemic antibiotic, initial encounter: Secondary | ICD-10-CM | POA: Diagnosis not present

## 2022-10-18 MED ORDER — FLUCONAZOLE 150 MG PO TABS
150.0000 mg | ORAL_TABLET | Freq: Once | ORAL | 0 refills | Status: AC
Start: 2022-10-18 — End: 2022-10-18

## 2022-10-18 NOTE — Progress Notes (Signed)

## 2022-11-05 ENCOUNTER — Ambulatory Visit: Payer: Medicaid Other | Admitting: Family Medicine

## 2022-11-05 ENCOUNTER — Other Ambulatory Visit: Payer: Self-pay | Admitting: Obstetrics and Gynecology

## 2022-11-05 VITALS — BP 138/88 | HR 92 | Temp 98.6°F | Ht 67.0 in | Wt 227.0 lb

## 2022-11-05 DIAGNOSIS — Z86718 Personal history of other venous thrombosis and embolism: Secondary | ICD-10-CM

## 2022-11-05 DIAGNOSIS — F419 Anxiety disorder, unspecified: Secondary | ICD-10-CM

## 2022-11-05 DIAGNOSIS — Z716 Tobacco abuse counseling: Secondary | ICD-10-CM

## 2022-11-05 DIAGNOSIS — F410 Panic disorder [episodic paroxysmal anxiety] without agoraphobia: Secondary | ICD-10-CM

## 2022-11-05 DIAGNOSIS — Z832 Family history of diseases of the blood and blood-forming organs and certain disorders involving the immune mechanism: Secondary | ICD-10-CM

## 2022-11-05 DIAGNOSIS — F4310 Post-traumatic stress disorder, unspecified: Secondary | ICD-10-CM | POA: Diagnosis not present

## 2022-11-05 DIAGNOSIS — F32A Depression, unspecified: Secondary | ICD-10-CM

## 2022-11-05 DIAGNOSIS — O133 Gestational [pregnancy-induced] hypertension without significant proteinuria, third trimester: Secondary | ICD-10-CM

## 2022-11-05 DIAGNOSIS — I10 Essential (primary) hypertension: Secondary | ICD-10-CM | POA: Diagnosis not present

## 2022-11-05 DIAGNOSIS — Z86711 Personal history of pulmonary embolism: Secondary | ICD-10-CM

## 2022-11-05 DIAGNOSIS — F17200 Nicotine dependence, unspecified, uncomplicated: Secondary | ICD-10-CM

## 2022-11-05 MED ORDER — VENLAFAXINE HCL 37.5 MG PO TABS
37.5000 mg | ORAL_TABLET | Freq: Two times a day (BID) | ORAL | 1 refills | Status: DC
Start: 2022-11-05 — End: 2022-12-06

## 2022-11-05 MED ORDER — LABETALOL HCL 100 MG PO TABS: 100.0000 mg | ORAL_TABLET | Freq: Two times a day (BID) | ORAL | 3 refills | Status: DC

## 2022-11-05 MED ORDER — APIXABAN 5 MG PO TABS
ORAL_TABLET | ORAL | 0 refills | Status: DC
Start: 2022-11-05 — End: 2022-12-06

## 2022-11-05 MED ORDER — ALPRAZOLAM 0.25 MG PO TABS
0.2500 mg | ORAL_TABLET | Freq: Two times a day (BID) | ORAL | 0 refills | Status: DC | PRN
Start: 2022-11-05 — End: 2023-08-10

## 2022-11-05 NOTE — Progress Notes (Signed)
Established patient visit   Patient: Jasmine Gray   DOB: 1990-08-08   31 y.o. Female  MRN: [id number] Visit Date: 11/05/2022  Today's healthcare provider: Sherlyn Hay, DO   Chief Complaint  Patient presents with   Depression    Patient was put on Lexapro for depression.  She does not feel it is helping   Hypertension    Patient has been checking her blood pressure at home and has been getting good readings but does not have a record with her.    PE / DVT    Patient had PE and a DVT and has finished her Eliquis and does not know if she is supposed to stay on it.    Subjective    HPI  Had a C-section, then developed blood clots approximately 2 weeks later in Feruary 2024 -Developed both the DVT and the pulmonary embolism in the same period Almost done with current prescription for Eliquis This is the first incidence of blood clots that she had had in her life.  No significant prior health issues before that.  Having left shoulder pain Pain all along left side  Ongoing since last Thursday Sleeps on left arm (arm up, head on arm) Feels different from blood clot but is concerned. Recently cleaned bathtub with left arm. Expanding chest outward relieves discomfort  Tobacco: Tried cold [place] unsuccessfully Nicotine patch with some success but stopped. Down to 5 cigarettes/day Started smoking at 19 and smoked up to 1/2-3/4 of a pack per day  Bps at 110s-120s/70s-80s No side effects from labetalol  Father - FVL - first clot at 35/36 Yo; passed away of MI at 31 Yo.  Anxiety: Tried escitalopram; did not help     Medications: Outpatient Medications Prior to Visit  Medication Sig   hydrOXYzine (ATARAX) 25 MG tablet Take 1 tablet (25 mg total) by mouth every 6 (six) hours as needed for itching.   [DISCONTINUED] apixaban (ELIQUIS) 5 MG TABS tablet Advised to take Eliquis 10 mg twice a day for 7 days followed by 5 mg twice daily for next 6 to 9 months for pulmonary  embolism.   [DISCONTINUED] escitalopram (LEXAPRO) 10 MG tablet Take 1 tablet (10 mg total) by mouth daily.   [DISCONTINUED] labetalol (NORMODYNE) 100 MG tablet TAKE 1 TABLET BY MOUTH TWICE A DAY   No facility-administered medications prior to visit.    Review of Systems  Constitutional:  Positive for fatigue. Negative for appetite change, chills and fever.  HENT:  Positive for congestion. Negative for rhinorrhea.   Respiratory:  Positive for cough (since Thursday) and shortness of breath (intermittent, when walking/moving around). Negative for chest tightness.   Cardiovascular:  Negative for chest pain and palpitations.  Gastrointestinal:  Negative for abdominal pain, nausea and vomiting.  Neurological:  Negative for dizziness and weakness.  Psychiatric/Behavioral:  Positive for sleep disturbance. The patient is nervous/anxious.         Objective    BP 138/88   Pulse 92   Temp 98.6 F (37 C) (Oral)   Ht 5\' 7"  (1.702 m)   Wt 227 lb (103 kg)   SpO2 99%   BMI 35.55 kg/m     Physical Exam Vitals and nursing note reviewed.  Constitutional:      General: She is not in acute distress.    Appearance: Normal appearance.  HENT:     Head: Normocephalic and atraumatic.  Eyes:     General: No scleral icterus.  Conjunctiva/sclera: Conjunctivae normal.  Cardiovascular:     Rate and Rhythm: Normal rate.  Pulmonary:     Effort: Pulmonary effort is normal.  Neurological:     Mental Status: She is alert and oriented to person, place, and time. Mental status is at baseline.  Psychiatric:        Mood and Affect: Mood normal.        Behavior: Behavior normal.       Assessment & Plan    Essential hypertension Assessment & Plan: Patient's blood pressure is mildly elevated today.  However, patient is very anxious today and reports her home blood pressures are within normal limits (as noted in HPI).  Will have her continue to check her blood pressures at home and bring in her log  her next visit. Will continue labetalol 100 mg twice daily.  Orders: -     Labetalol HCl; Take 1 tablet (100 mg total) by mouth 2 (two) times daily.  Dispense: 180 tablet; Refill: 3  Anxiety and depression Assessment & Plan: Prior discussion, will start patient on venlafaxine 37.5 mg twice daily with as needed 0.25 mg alprazolam to control panic attacks. Patient to follow-up in 4 weeks for reassessment, or with psychiatry to which I am referring her today if she is able to get an appointment that soon.  Orders: -     Ambulatory referral to Psychiatry -     Venlafaxine HCl; Take 1 tablet (37.5 mg total) by mouth 2 (two) times daily.  Dispense: 30 tablet; Refill: 1  Post traumatic stress disorder (PTSD) -     Ambulatory referral to Psychiatry -     Venlafaxine HCl; Take 1 tablet (37.5 mg total) by mouth 2 (two) times daily.  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  Dispense: 20 tablet; Refill: 0  Nicotine dependence with current use Assessment & Plan: Counseled patient regarding smoking cessation.  Patient would like to continue trying her on her own to reduce her nicotine dependence.   Encounter for smoking cessation counseling  History of deep venous thrombosis (DVT) of distal vein of right lower extremity Assessment & Plan: Given patient's history of what sounds like provoked blood clots, I discussed with the patient that we would normally discontinue her Eliquis today.  However, her father has diagnosed factor V Leiden.  So, we will go ahead and check coagulation studies and reconsider whether to stop or continue her Eliquis on follow-up.  Orders: -     Factor 5 leiden -     Von Willebrand Factor Multimer -     Prothrombin gene mutation -     Protein C, total -     Protein S, total -     Antithrombin panel -      Antiphospholipid syndrome eval, bld -     Apixaban; Advised to take Eliquis 10 mg twice a day for 7 days followed by 5 mg twice daily for next 6 to 9 months for pulmonary embolism.  Dispense: 60 tablet; Refill: 0 -     Coag Studies Interp Report -     DRVVT CONFIRM  History of pulmonary embolus (PE) -     Factor 5 leiden -     Von Willebrand Factor Multimer -     Prothrombin gene mutation -  Protein C, total -     Protein S, total -     Antithrombin panel -     Antiphospholipid syndrome eval, bld -     Apixaban; Advised to take Eliquis 10 mg twice a day for 7 days followed by 5 mg twice daily for next 6 to 9 months for pulmonary embolism.  Dispense: 60 tablet; Refill: 0 -     Coag Studies Interp Report -     DRVVT CONFIRM  Family history of factor V Leiden mutation -     Factor 5 leiden -     Von Willebrand Factor Multimer -     Prothrombin gene mutation -     Protein C, total -     Protein S, total -     Antithrombin panel -     Antiphospholipid syndrome eval, bld -     Apixaban; Advised to take Eliquis 10 mg twice a day for 7 days followed by 5 mg twice daily for next 6 to 9 months for pulmonary embolism.  Dispense: 60 tablet; Refill: 0 -     Coag Studies Interp Report -     DRVVT CONFIRM   Return in about 3 months (around 02/05/2023).      I discussed the assessment and treatment plan with the patient  The patient was provided an opportunity to ask questions and all were answered. The patient agreed with the plan and demonstrated an understanding of the instructions.   The patient was advised to call back or seek an in-person evaluation if the symptoms worsen or if the condition fails to improve as anticipated.  Total time was 50 minutes. That includes chart review before the visit, the actual patient visit, and time spent on documentation after the visit.    Sherlyn Hay, DO  [facility] [phone] (phone) [phone] (fax)  [facility]

## 2022-11-15 LAB — PROTEIN S, TOTAL: Protein S Ag, Total: 85 % (ref 60–150)

## 2022-11-15 LAB — VON WILLEBRAND FACTOR MULTIMER

## 2022-11-15 LAB — COAG STUDIES INTERP REPORT

## 2022-11-15 LAB — FACTOR 5 LEIDEN

## 2022-11-15 LAB — ANTIPHOSPHOLIPID SYNDROME EVAL, BLD
APTT PPP: 26.1 s (ref 22.9–30.2)
Anticardiolipin IgG: <9 [GPL'U]/mL (ref 0–14)
Anticardiolipin IgM: <9 [MPL'U]/mL (ref 0–12)
Beta-2 Glyco 1 IgM: <9 GPI IgM units (ref 0–32)
Beta-2 Glyco I IgG: <9 GPI IgG units (ref 0–20)
Dilute Viper Venom Time: 52.7 s — ABNORMAL HIGH (ref 0.0–47.0)
Hexagonal Phase Phospholipid: 6 s (ref 0–11)
INR: 1 (ref 0.9–1.2)
PT: 11.1 s (ref 9.1–12.0)
Thrombin Time: 17.4 s (ref 0.0–23.0)

## 2022-11-15 LAB — PROTHROMBIN GENE MUTATION

## 2022-11-15 LAB — ANTITHROMBIN PANEL
AT III AG PPP IMM-ACNC: 97 % (ref 72–124)
AntiThromb III Func: 126 % (ref 75–135)

## 2022-11-15 LAB — DRVVT CONFIRM: dRVVT Confirm: 1.1 {ratio} (ref 0.8–1.2)

## 2022-11-15 LAB — PROTEIN C, TOTAL: Protein C Antigen: 86 % (ref 60–150)

## 2022-11-21 ENCOUNTER — Encounter: Payer: Self-pay | Admitting: Obstetrics and Gynecology

## 2022-11-21 ENCOUNTER — Ambulatory Visit (INDEPENDENT_AMBULATORY_CARE_PROVIDER_SITE_OTHER): Payer: Medicaid Other | Admitting: Obstetrics and Gynecology

## 2022-11-21 VITALS — BP 120/80 | HR 64 | Ht 67.0 in | Wt 225.7 lb

## 2022-11-21 DIAGNOSIS — Z01419 Encounter for gynecological examination (general) (routine) without abnormal findings: Secondary | ICD-10-CM | POA: Diagnosis not present

## 2022-11-21 NOTE — Progress Notes (Signed)
Patients presents for annual exam today. She states doing better at this time, continuing to take her BP medication daily. Up to date on pap smear. She states no other questions or concerns at this time.

## 2022-11-21 NOTE — Progress Notes (Signed)
HPI:      [name] is a 32 y.o. [id number] who LMP was Patient's last menstrual period was 11/16/2022.  Subjective:   She presents today for her annual examination.  She says that she is doing much better and all ways.  She is currently using condoms for birth control.  She is considering other methods. Her last pregnancy was complicated by IUFD, cesarean delivery and subsequent DVT with PE. At this time she continues to take labetalol.  She has a PCP who is following her blood pressures.  She is not sure about future pregnancy but is considering another pregnancy possibly next year.    Hx: The following portions of the patient's history were reviewed and updated as appropriate:             She  has a past medical history of Acid reflux, Anemia (12/03/2022), Blood transfusion without reported diagnosis (03/03/2022), Depression (03/05/2022), Hemorrhoid (11/2015), Hypertension, and Tobacco abuse. She does not have any pertinent problems on file. She  has a past surgical history that includes Cesarean section (03/03/2022). Her family history includes Alcohol abuse in her maternal grandfather; Cancer (age of onset: 37) in her maternal grandmother; Congestive Heart Failure in her father; Factor V Leiden deficiency in her father; Heart disease in her father; Hypertension in her father, mother, and paternal grandfather; Migraines in her mother; Stroke in her father. She  reports that she has been smoking cigarettes. She has a 5 pack-year smoking history. She has never used smokeless tobacco. She reports that she does not drink alcohol and does not use drugs. She has a current medication list which includes the following prescription(s): alprazolam, apixaban, hydroxyzine, labetalol, venlafaxine, [DISCONTINUED] albuterol, [DISCONTINUED] metoprolol tartrate, and [DISCONTINUED] omeprazole. She is allergic to penicillins.       Review of Systems:  Review of Systems  Constitutional: Denied  constitutional symptoms, night sweats, recent illness, fatigue, fever, insomnia and weight loss.  Eyes: Denied eye symptoms, eye pain, photophobia, vision change and visual disturbance.  Ears/Nose/Throat/Neck: Denied ear, nose, throat or neck symptoms, hearing loss, nasal discharge, sinus congestion and sore throat.  Cardiovascular: Denied cardiovascular symptoms, arrhythmia, chest pain/pressure, edema, exercise intolerance, orthopnea and palpitations.  Respiratory: Denied pulmonary symptoms, asthma, pleuritic pain, productive sputum, cough, dyspnea and wheezing.  Gastrointestinal: Denied, gastro-esophageal reflux, melena, nausea and vomiting.  Genitourinary: Denied genitourinary symptoms including symptomatic vaginal discharge, pelvic relaxation issues, and urinary complaints.  Musculoskeletal: Denied musculoskeletal symptoms, stiffness, swelling, muscle weakness and myalgia.  Dermatologic: Denied dermatology symptoms, rash and scar.  Neurologic: Denied neurology symptoms, dizziness, headache, neck pain and syncope.  Psychiatric: Denied psychiatric symptoms, anxiety and depression.  Endocrine: Denied endocrine symptoms including hot flashes and night sweats.   Meds:   Current Outpatient Medications on File Prior to Visit  Medication Sig Dispense Refill   ALPRAZolam (XANAX) 0.25 MG tablet Take 1 tablet (0.25 mg total) by mouth 2 (two) times daily as needed for anxiety. 20 tablet 0   apixaban (ELIQUIS) 5 MG TABS tablet Advised to take Eliquis 10 mg twice a day for 7 days followed by 5 mg twice daily for next 6 to 9 months for pulmonary embolism. 60 tablet 0   hydrOXYzine (ATARAX) 25 MG tablet Take 1 tablet (25 mg total) by mouth every 6 (six) hours as needed for itching. 30 tablet 2   labetalol (NORMODYNE) 100 MG tablet Take 1 tablet (100 mg total) by mouth 2 (two) times daily. 180 tablet 3   venlafaxine (EFFEXOR) 37.5  MG tablet Take 1 tablet (37.5 mg total) by mouth 2 (two) times daily. 30  tablet 1   [DISCONTINUED] albuterol (VENTOLIN HFA) 108 (90 Base) MCG/ACT inhaler Inhale 2 puffs into the lungs every 6 (six) hours as needed for wheezing or shortness of breath. 8 g 0   [DISCONTINUED] metoprolol tartrate (LOPRESSOR) 25 MG tablet Take 0.5 tablets (12.5 mg total) by mouth 2 (two) times daily as needed (Palpitations). (Patient not taking: Reported on 07/08/2019) 20 tablet 0   [DISCONTINUED] omeprazole (PRILOSEC) 20 MG capsule Take 1 capsule (20 mg total) by mouth daily. 30 capsule 0   No current facility-administered medications on file prior to visit.     Objective:     Vitals:   11/21/22 0927 11/21/22 0937  BP: (!) 148/86 120/80  Pulse: 64     Filed Weights   11/21/22 0927  Weight: 225 lb 11.2 oz (102.4 kg)              Physical examination General NAD, Conversant  HEENT Atraumatic; Op clear with mmm.  Normo-cephalic.  Anicteric sclerae  Thyroid/Neck Smooth without nodularity or enlargement. Normal ROM.  Neck Supple.  Skin No rashes, lesions or ulceration. Normal palpated skin turgor. No nodularity.  Breasts: No masses or discharge.  Symmetric.  No axillary adenopathy.  Lungs: Clear to auscultation.No rales or wheezes. Normal Respiratory effort, no retractions.  Heart: NSR.  No murmurs or rubs appreciated. No peripheral edema  Abdomen: Soft.  Non-tender.  No masses.  No HSM. No hernia  Extremities: Moves all appropriately.  Normal ROM for age. No lymphadenopathy.  Neuro: Oriented to PPT.  Normal mood. Normal affect.     Pelvic: Deferred by patient     Assessment:    [id number] Patient Active Problem List   Diagnosis Date Noted   Family history of factor V Leiden mutation 11/05/2022   History of deep venous thrombosis (DVT) of distal vein of right lower extremity 11/05/2022   Nicotine dependence with current use 11/05/2022   Anxiety and depression 06/20/2022   History of gestational hypertension 06/20/2022   History of pulmonary embolus (PE) 03/20/2022   DVT  (deep venous thrombosis) (HCC) 03/20/2022   History of IUFD 03/20/2022   Essential hypertension 03/20/2022   Postpartum care following cesarean delivery 03/05/2022   History of premature delivery 04/29/2016   Overweight (BMI 25.0-29.9) 07/28/2015   Tobacco user 05/31/2014     1. Well woman exam with routine gynecological exam     Patient much improved with her anxiety.   Plan:            1.  Basic Screening Recommendations The basic screening recommendations for asymptomatic women were discussed with the patient during her visit.  The age-appropriate recommendations were discussed with her and the rational for the tests reviewed.  When I am informed by the patient that another primary care physician has previously obtained the age-appropriate tests and they are up-to-date, only outstanding tests are ordered and referrals given as necessary.  Abnormal results of tests will be discussed with her when all of her results are completed.  Routine preventative health maintenance measures emphasized: Exercise/Diet/Weight control, Tobacco Warnings, Alcohol/Substance use risks and Stress Management 2. Continue antihypertensives and follow-up with PCP 3.  Patient to return for IUD if desired. (Condoms until that time) 4.  Future pregnancy with likely use of antihypertensives and Lovenox.  Orders No orders of the defined types were placed in this encounter.   No orders of the defined types  were placed in this encounter.         F/U  Return in about 1 year (around 11/21/2023) for Annual Physical.  Elonda Husky, M.D. 11/21/2022 9:48 AM

## 2022-11-25 ENCOUNTER — Encounter: Payer: Self-pay | Admitting: Family Medicine

## 2022-11-25 DIAGNOSIS — Z716 Tobacco abuse counseling: Secondary | ICD-10-CM | POA: Insufficient documentation

## 2022-11-25 DIAGNOSIS — F410 Panic disorder [episodic paroxysmal anxiety] without agoraphobia: Secondary | ICD-10-CM | POA: Insufficient documentation

## 2022-11-25 NOTE — Assessment & Plan Note (Addendum)
Patient's blood pressure is mildly elevated today.  However, patient is very anxious today and reports her home blood pressures are within normal limits (as noted in HPI).  Will have her continue to check her blood pressures at home and bring in her log her next visit. Will continue labetalol 100 mg twice daily.

## 2022-11-25 NOTE — Assessment & Plan Note (Addendum)
Prior discussion, will start patient on venlafaxine 37.5 mg twice daily with as needed 0.25 mg alprazolam to control panic attacks. Patient to follow-up in 4 weeks for reassessment, or with psychiatry to which I am referring her today if she is able to get an appointment that soon.

## 2022-11-25 NOTE — Assessment & Plan Note (Signed)
Given patient's history of what sounds like provoked blood clots, I discussed with the patient that we would normally discontinue her Eliquis today.  However, her father has diagnosed factor V Leiden.  So, we will go ahead and check coagulation studies and reconsider whether to stop or continue her Eliquis on follow-up.

## 2022-11-25 NOTE — Assessment & Plan Note (Addendum)
Counseled patient regarding smoking cessation.  Patient would like to continue trying her on her own to reduce her nicotine dependence.

## 2022-12-03 ENCOUNTER — Ambulatory Visit: Payer: Self-pay

## 2022-12-05 ENCOUNTER — Ambulatory Visit: Payer: Medicaid Other | Admitting: Obstetrics and Gynecology

## 2022-12-05 DIAGNOSIS — Z30430 Encounter for insertion of intrauterine contraceptive device: Secondary | ICD-10-CM

## 2022-12-06 ENCOUNTER — Encounter: Payer: Self-pay | Admitting: Family Medicine

## 2022-12-06 ENCOUNTER — Ambulatory Visit: Payer: Medicaid Other | Admitting: Family Medicine

## 2022-12-06 VITALS — BP 108/77 | HR 82 | Temp 99.0°F | Ht 67.0 in | Wt 225.0 lb

## 2022-12-06 DIAGNOSIS — Z86711 Personal history of pulmonary embolism: Secondary | ICD-10-CM

## 2022-12-06 DIAGNOSIS — F419 Anxiety disorder, unspecified: Secondary | ICD-10-CM

## 2022-12-06 DIAGNOSIS — F32A Depression, unspecified: Secondary | ICD-10-CM

## 2022-12-06 DIAGNOSIS — Z86718 Personal history of other venous thrombosis and embolism: Secondary | ICD-10-CM | POA: Diagnosis not present

## 2022-12-06 DIAGNOSIS — I10 Essential (primary) hypertension: Secondary | ICD-10-CM

## 2022-12-06 DIAGNOSIS — D6851 Activated protein C resistance: Secondary | ICD-10-CM | POA: Diagnosis not present

## 2022-12-06 DIAGNOSIS — R4589 Other symptoms and signs involving emotional state: Secondary | ICD-10-CM

## 2022-12-06 DIAGNOSIS — F4310 Post-traumatic stress disorder, unspecified: Secondary | ICD-10-CM | POA: Insufficient documentation

## 2022-12-06 DIAGNOSIS — Z716 Tobacco abuse counseling: Secondary | ICD-10-CM

## 2022-12-06 MED ORDER — APIXABAN 5 MG PO TABS
5.0000 mg | ORAL_TABLET | Freq: Two times a day (BID) | ORAL | 1 refills | Status: DC
Start: 2022-12-06 — End: 2023-05-30

## 2022-12-06 MED ORDER — VENLAFAXINE HCL 37.5 MG PO TABS: 37.5000 mg | ORAL_TABLET | Freq: Two times a day (BID) | ORAL | 1 refills | Status: DC

## 2022-12-06 NOTE — Assessment & Plan Note (Addendum)
Improved and fairly well-controlled at this point PHQ-9 score down 14 -> 7 GAD-7 score down 13 -> 8 Discussed increase versus maintenance of current dose; will continue current dose for full effect (6 to 8-week mark) and reconsider increase later if indicated. Continue venlafaxine 37.5 mg twice daily; refilled today.

## 2022-12-06 NOTE — Assessment & Plan Note (Addendum)
Related to history of IUFD 75 w 1/24, s/p c section earlier this year with postop DVT/PE; now positive for heterozygous single gene factor V Leiden mutation. Doing fairly well overall; continue venlafaxine as noted above.

## 2022-12-06 NOTE — Progress Notes (Signed)
Established patient visit   Patient: Jasmine Gray   DOB: 1991-01-06   32 y.o. Female  MRN: [id number] Visit Date: 12/06/2022  Today's healthcare provider: Sherlyn Hay, DO   Chief Complaint  Patient presents with   Hypertension    Patient was seen on 11/05/22.  No changes in management at that time. Patient was asked to monitor blood pressure at home and she reports them running 120s over 70s.   Anxiety and Depression    Patient was seen on 11/05/22.  She was started on Venlafaxine to take with Alprazolam.  She reports she is doing better and starting to have more good days than bad.   Subjective    HPI COVID booster? Want referral to hematology?  Yes Genetic testing to discuss who else should be tested? No  Hypertension, follow-up  BP Readings from Last 3 Encounters:  12/06/22 108/77  11/21/22 120/80  11/05/22 138/88   Wt Readings from Last 3 Encounters:  12/06/22 225 lb (102.1 kg)  11/21/22 225 lb 11.2 oz (102.4 kg)  11/05/22 227 lb (103 kg)     She was last seen for hypertension 1 months ago.  BP at that visit was 138/88. Management since that visit includes continue labetalol 100 mg bid; monitor home BP with plan for recheck.     - patient also started on medication for anxiety (venlafaxine)  She reports excellent compliance with treatment. She is not having side effects.  She is following a Regular diet. She is exercising. Walking with her kids and taking them to park. She does smoke.  Use of agents associated with hypertension: none and tylenol .   Outside blood pressures are 120s/70s.  Symptoms: No chest pain No chest pressure  No palpitations No syncope  No dyspnea No orthopnea  No paroxysmal nocturnal dyspnea No lower extremity edema   Pertinent labs No results found for: "CHOL", "HDL", "LDLCALC", "LDLDIRECT", "TRIG", "CHOLHDL" Lab Results  Component Value Date   NA 139 05/26/2022   K 3.4 (L) 05/26/2022   CREATININE 0.73 05/26/2022   GFRNONAA  >60 05/26/2022   GLUCOSE 99 05/26/2022     The ASCVD Risk score (Arnett DK, et al., 2019) failed to calculate for the following reasons:   The 2019 ASCVD risk score is only valid for ages 36 to 69  ---------------------------------------------------------------------------------------------------     Medications: Outpatient Medications Prior to Visit  Medication Sig   ALPRAZolam (XANAX) 0.25 MG tablet Take 1 tablet (0.25 mg total) by mouth 2 (two) times daily as needed for anxiety.   apixaban (ELIQUIS) 5 MG TABS tablet Advised to take Eliquis 10 mg twice a day for 7 days followed by 5 mg twice daily for next 6 to 9 months for pulmonary embolism.   hydrOXYzine (ATARAX) 25 MG tablet Take 1 tablet (25 mg total) by mouth every 6 (six) hours as needed for itching.   labetalol (NORMODYNE) 100 MG tablet Take 1 tablet (100 mg total) by mouth 2 (two) times daily.   venlafaxine (EFFEXOR) 37.5 MG tablet Take 1 tablet (37.5 mg total) by mouth 2 (two) times daily.   No facility-administered medications prior to visit.    Review of Systems  Constitutional:  Negative for appetite change, chills, fatigue and fever.  HENT:  Negative for congestion, ear pain, rhinorrhea, sinus pressure and sinus pain.   Respiratory:  Negative for chest tightness and shortness of breath.   Cardiovascular:  Negative for chest pain and palpitations.  Gastrointestinal:  Negative for abdominal pain, nausea and vomiting.  Neurological:  Negative for dizziness and weakness.        Objective    BP 108/77 (BP Location: Right Arm, Patient Position: Sitting, Cuff Size: Normal) Comment: checked around 0945  Pulse 82   Temp 99 F (37.2 C) (Oral)   Ht 5\' 7"  (1.702 m)   Wt 225 lb (102.1 kg)   LMP 11/16/2022   SpO2 100%   BMI 35.24 kg/m     Physical Exam Constitutional:      General: She is not in acute distress.    Appearance: Normal appearance. She is not ill-appearing or toxic-appearing.  HENT:     Head:  Normocephalic and atraumatic.  Eyes:     General: No scleral icterus.    Extraocular Movements: Extraocular movements intact.     Conjunctiva/sclera: Conjunctivae normal.  Cardiovascular:     Rate and Rhythm: Normal rate and regular rhythm.     Pulses: Normal pulses.     Heart sounds: Normal heart sounds.  Pulmonary:     Effort: Pulmonary effort is normal. No respiratory distress.     Breath sounds: Normal breath sounds.  Abdominal:     General: Bowel sounds are normal. There is no distension.     Palpations: Abdomen is soft. There is no mass.     Tenderness: There is no abdominal tenderness. There is no guarding.  Musculoskeletal:     Right lower leg: No edema.     Left lower leg: No edema.  Skin:    General: Skin is warm and dry.  Neurological:     Mental Status: She is alert and oriented to person, place, and time. Mental status is at baseline.  Psychiatric:        Mood and Affect: Mood normal.        Behavior: Behavior normal.      No results found for any visits on 12/06/22.  Assessment & Plan    Essential hypertension Assessment & Plan: BP borderline elevated today; home BP readings more often in 120s/70s than not. BP on recheck improved to WNL after patient sat quietly for a few minutes at the end of our visit. Continue labetalol 100 mg twice daily  Patient will continue to monitor at home and notify the clinic if her blood pressures start to remain consistently >130/80.   Heterozygous factor V Leiden mutation [facility]) Assessment & Plan: Will refer patient to hematology for specialist  recent positive heterozygous factor V Leiden, continued eliquis and potential intent to become pregnant in future. Continue Eliquis 5 mg twice daily; refilled today.   History of pulmonary embolus (PE)  History of deep venous thrombosis (DVT) of distal vein of right lower extremity  Anxiety and depression Assessment & Plan: Improved and fairly well-controlled at this  point PHQ-9 score down 14 -> 7 GAD-7 score down 13 -> 8 Discussed increase versus maintenance of current dose; will continue current dose for full effect (6 to 8-week mark) and reconsider increase later if indicated. Continue venlafaxine 37.5 mg twice daily; refilled today.   Anxiety about health Assessment & Plan: Related to history of IUFD 23 w 1/24, s/p c section earlier this year with postop DVT/PE; now positive for heterozygous single gene factor V Leiden mutation. Doing fairly well overall; continue venlafaxine as noted above.   Post traumatic stress disorder (PTSD) Assessment & Plan: Related to history of IUFD 71 w 1/24, s/p c section earlier this year with postop DVT/PE; now positive  for heterozygous single gene factor V Leiden mutation. Doing fairly well overall; continue venlafaxine as noted above.   Encounter for smoking cessation counseling Assessment & Plan: Discussed smoking cessation again today.  Patient will continue to try to taper her use down. Patient does not want to try medication intervention at this time. Recommended Fum cigarette-shaped "supplement" to draw air through, in order to dissociate the activity of bring something to her mouth from the hit of nicotine and thereby facilitating smoking cessation.    Return in about 5 months (around 05/06/2023) for CPE, labs.      I discussed the assessment and treatment plan with the patient  The patient was provided an opportunity to ask questions and all were answered. The patient agreed with the plan and demonstrated an understanding of the instructions.   The patient was advised to call back or seek an in-person evaluation if the symptoms worsen or if the condition fails to improve as anticipated.  Total time was 40 minutes. That includes chart review before the visit, the actual patient visit, and time spent on documentation after the visit.     Sherlyn Hay, DO  [facility] [phone] (phone) [phone] (fax)  [facility]

## 2022-12-06 NOTE — Assessment & Plan Note (Addendum)
BP borderline elevated today; home BP readings more often in 120s/70s than not. BP on recheck improved to WNL after patient sat quietly for a few minutes at the end of our visit. Continue labetalol 100 mg twice daily  Patient will continue to monitor at home and notify the clinic if her blood pressures start to remain consistently >130/80.

## 2022-12-06 NOTE — Assessment & Plan Note (Signed)
Will refer patient to hematology for specialist  recent positive heterozygous factor V Leiden, continued eliquis and potential intent to become pregnant in future. Continue Eliquis 5 mg twice daily; refilled today.

## 2022-12-06 NOTE — Assessment & Plan Note (Signed)
Related to history of IUFD 75 w 1/24, s/p c section earlier this year with postop DVT/PE; now positive for heterozygous single gene factor V Leiden mutation. Doing fairly well overall; continue venlafaxine as noted above.

## 2022-12-06 NOTE — Assessment & Plan Note (Signed)
Discussed smoking cessation again today.  Patient will continue to try to taper her use down. Patient does not want to try medication intervention at this time. Recommended Fum cigarette-shaped "supplement" to draw air through, in order to dissociate the activity of bring something to her mouth from the hit of nicotine and thereby facilitating smoking cessation.

## 2022-12-11 ENCOUNTER — Other Ambulatory Visit: Payer: Self-pay | Admitting: Family Medicine

## 2022-12-11 DIAGNOSIS — Z86718 Personal history of other venous thrombosis and embolism: Secondary | ICD-10-CM

## 2022-12-11 DIAGNOSIS — Z86711 Personal history of pulmonary embolism: Secondary | ICD-10-CM

## 2022-12-13 ENCOUNTER — Other Ambulatory Visit: Payer: Medicaid Other

## 2022-12-13 ENCOUNTER — Inpatient Hospital Stay: Payer: Medicaid Other | Admitting: Oncology

## 2022-12-16 ENCOUNTER — Encounter: Payer: Self-pay | Admitting: Oncology

## 2022-12-16 ENCOUNTER — Inpatient Hospital Stay: Payer: Medicaid Other

## 2022-12-16 ENCOUNTER — Inpatient Hospital Stay: Payer: Medicaid Other | Attending: Oncology | Admitting: Oncology

## 2022-12-28 ENCOUNTER — Telehealth: Payer: Medicaid Other | Admitting: Family Medicine

## 2022-12-28 DIAGNOSIS — J209 Acute bronchitis, unspecified: Secondary | ICD-10-CM

## 2022-12-28 MED ORDER — AZITHROMYCIN 250 MG PO TABS
ORAL_TABLET | ORAL | 0 refills | Status: AC
Start: 2022-12-28 — End: 2023-01-02

## 2022-12-28 MED ORDER — DM-GUAIFENESIN ER 30-600 MG PO TB12
1.0000 | ORAL_TABLET | Freq: Two times a day (BID) | ORAL | 0 refills | Status: AC
Start: 2022-12-28 — End: 2023-01-04

## 2022-12-28 NOTE — Progress Notes (Signed)
E-Visit for Cough   We are sorry that you are not feeling well.  Here is how we plan to help!  Based on your presentation I believe you most likely have A cough due to bacteria.  When patients have a fever and a productive cough with a change in color or increased sputum production, we are concerned about bacterial bronchitis.  If left untreated it can progress to pneumonia.  If your symptoms do not improve with your treatment plan it is important that you contact your provider.   I have prescribed Azithromyin 250 mg: two tablets now and then one tablet daily for 4 additonal days    In addition you may use A non-prescription cough medication called Mucinex DM: take 2 tablets every 12 hours.   From your responses in the eVisit questionnaire you describe inflammation in the upper respiratory tract which is causing a significant cough.  This is commonly called Bronchitis and has four common causes:   Allergies Viral Infections Acid Reflux Bacterial Infection Allergies, viruses and acid reflux are treated by controlling symptoms or eliminating the cause. An example might be a cough caused by taking certain blood pressure medications. You stop the cough by changing the medication. Another example might be a cough caused by acid reflux. Controlling the reflux helps control the cough.  USE OF BRONCHODILATOR ("RESCUE") INHALERS: There is a risk from using your bronchodilator too frequently.  The risk is that over-reliance on a medication which only relaxes the muscles surrounding the breathing tubes can reduce the effectiveness of medications prescribed to reduce swelling and congestion of the tubes themselves.  Although you feel brief relief from the bronchodilator inhaler, your asthma may actually be worsening with the tubes becoming more swollen and filled with mucus.  This can delay other crucial treatments, such as oral steroid medications. If you need to use a bronchodilator inhaler daily, several  times per day, you should discuss this with your provider.  There are probably better treatments that could be used to keep your asthma under control.     HOME CARE Only take medications as instructed by your medical team. Complete the entire course of an antibiotic. Drink plenty of fluids and get plenty of rest. Avoid close contacts especially the very young and the elderly Cover your mouth if you cough or cough into your sleeve. Always remember to wash your hands A steam or ultrasonic humidifier can help congestion.   GET HELP RIGHT AWAY IF: You develop worsening fever. You become short of breath You cough up blood. Your symptoms persist after you have completed your treatment plan MAKE SURE YOU  Understand these instructions. Will watch your condition. Will get help right away if you are not doing well or get worse.    Thank you for choosing an e-visit.  Your e-visit answers were reviewed by a board certified advanced clinical practitioner to complete your personal care plan. Depending upon the condition, your plan could have included both over the counter or prescription medications.  Please review your pharmacy choice. Make sure the pharmacy is open so you can pick up prescription now. If there is a problem, you may contact your provider through [employer] and have the prescription routed to another pharmacy.  Your safety is important to [place]. If you have drug allergies check your prescription carefully.   For the next 24 hours you can use MyChart to ask questions about today's visit, request a non-urgent call back, or ask for a work  or school excuse. You will get an email in the next two days asking about your experience. I hope that your e-visit has been valuable and will speed your recovery.  I have spent 5 minutes in review of e-visit questionnaire, review and updating patient chart, medical decision making and response to patient.   Barnwell Pandy, PA-C

## 2023-02-06 ENCOUNTER — Telehealth: Payer: Self-pay | Admitting: Physician Assistant

## 2023-02-06 DIAGNOSIS — L02213 Cutaneous abscess of chest wall: Secondary | ICD-10-CM

## 2023-02-06 MED ORDER — SULFAMETHOXAZOLE-TRIMETHOPRIM 800-160 MG PO TABS
1.0000 | ORAL_TABLET | Freq: Two times a day (BID) | ORAL | 0 refills | Status: DC
Start: 2023-02-06 — End: 2023-06-09

## 2023-02-06 NOTE — Progress Notes (Signed)
 E-Visit for Skin Abscess  We are sorry that you are not feeling well. Here is how we plan to help!  Based on what you shared with me it looks like you have an abscess.  An abscess looks like areas of skin redness, swelling, and warmth; it develops as a result of bacteria entering under the skin. Little red spots and/or bleeding can be seen in skin, and tiny surface sacs containing fluid can occur. Fever can be present.   I have prescribed:  Bactrim  DS 1 tablet by mouth twice a day for 7 days  HOME CARE:  Take your medications as ordered and take all of them, even if the skin irritation appears to be healing.   GET HELP RIGHT AWAY IF:  Symptoms that don't begin to go away within 48 hours. Severe redness persists or worsens If the area turns color, spreads or swells. If it blisters and opens, develops yellow-brown crust or bleeds. You develop a fever or chills. If the pain increases or becomes unbearable.  Are unable to keep fluids and food down.  MAKE SURE YOU   Understand these instructions. Will watch your condition. Will get help right away if you are not doing well or get worse.  Thank you for choosing an e-visit.  Your e-visit answers were reviewed by a board certified advanced clinical practitioner to complete your personal care plan. Depending upon the condition, your plan could have included both over the counter or prescription medications.  Please review your pharmacy choice. Make sure the pharmacy is open so you can pick up prescription now. If there is a problem, you may contact your provider through [employer] and have the prescription routed to another pharmacy.  Your safety is important to us . If you have drug allergies check your prescription carefully.   For the next 24 hours you can use MyChart to ask questions about today's visit, request a non-urgent call back, or ask for a work or school excuse. You will get an email in the next two days asking about your  experience. I hope that your e-visit has been valuable and will speed your recovery.   I have spent 5 minutes in review of e-visit questionnaire, review and updating patient chart, medical decision making and response to patient.   Delon CHRISTELLA Dickinson, PA-C

## 2023-02-12 ENCOUNTER — Other Ambulatory Visit: Payer: Self-pay | Admitting: Medical Genetics

## 2023-03-01 ENCOUNTER — Telehealth: Payer: Medicaid Other | Admitting: Nurse Practitioner

## 2023-03-01 DIAGNOSIS — J069 Acute upper respiratory infection, unspecified: Secondary | ICD-10-CM | POA: Diagnosis not present

## 2023-03-01 MED ORDER — BENZONATATE 200 MG PO CAPS
200.0000 mg | ORAL_CAPSULE | Freq: Two times a day (BID) | ORAL | 0 refills | Status: DC | PRN
Start: 2023-03-01 — End: 2023-06-09

## 2023-03-01 MED ORDER — IPRATROPIUM BROMIDE 0.03 % NA SOLN
2.0000 | Freq: Two times a day (BID) | NASAL | 12 refills | Status: DC
Start: 2023-03-01 — End: 2023-06-09

## 2023-03-01 NOTE — Progress Notes (Signed)

## 2023-03-01 NOTE — Progress Notes (Signed)
I have spent 5 minutes in review of e-visit questionnaire, review and updating patient chart, medical decision making and response to patient.   Claiborne Rigg, NP

## 2023-03-06 ENCOUNTER — Telehealth: Payer: Medicaid Other | Admitting: Family Medicine

## 2023-03-06 DIAGNOSIS — B9689 Other specified bacterial agents as the cause of diseases classified elsewhere: Secondary | ICD-10-CM

## 2023-03-06 MED ORDER — AZITHROMYCIN 250 MG PO TABS
ORAL_TABLET | ORAL | 0 refills | Status: AC
Start: 2023-03-06 — End: 2023-03-11

## 2023-03-06 NOTE — Progress Notes (Signed)
E-Visit for Sinus Problems  We are sorry that you are not feeling well.  Here is how we plan to help!  Based on what you have shared with me it looks like you have sinusitis.  Sinusitis is inflammation and infection in the sinus cavities of the head.  Based on your presentation I believe you most likely have Acute Bacterial Sinusitis.  This is an infection caused by bacteria and is treated with antibiotics. I have prescribed Z pack. You may use an oral decongestant such as Mucinex D or if you have glaucoma or high blood pressure use plain Mucinex. Saline nasal spray help and can safely be used as often as needed for congestion.  If you develop worsening sinus pain, fever or notice severe headache and vision changes, or if symptoms are not better after completion of antibiotic, please schedule an appointment with a health care provider.    Sinus infections are not as easily transmitted as other respiratory infection, however we still recommend that you avoid close contact with loved ones, especially the very young and elderly.  Remember to wash your hands thoroughly throughout the day as this is the number one way to prevent the spread of infection!  Home Care: Only take medications as instructed by your medical team. Complete the entire course of an antibiotic. Do not take these medications with alcohol. A steam or ultrasonic humidifier can help congestion.  You can place a towel over your head and breathe in the steam from hot water coming from a faucet. Avoid close contacts especially the very young and the elderly. Cover your mouth when you cough or sneeze. Always remember to wash your hands.  Get Help Right Away If: You develop worsening fever or sinus pain. You develop a severe head ache or visual changes. Your symptoms persist after you have completed your treatment plan.  Make sure you Understand these instructions. Will watch your condition. Will get help right away if you are not  doing well or get worse.  Thank you for choosing an e-visit.  Your e-visit answers were reviewed by a board certified advanced clinical practitioner to complete your personal care plan. Depending upon the condition, your plan could have included both over the counter or prescription medications.  Please review your pharmacy choice. Make sure the pharmacy is open so you can pick up prescription now. If there is a problem, you may contact your provider through [employer] and have the prescription routed to another pharmacy.  Your safety is important to [place]. If you have drug allergies check your prescription carefully.   For the next 24 hours you can use MyChart to ask questions about today's visit, request a non-urgent call back, or ask for a work or school excuse. You will get an email in the next two days asking about your experience. I hope that your e-visit has been valuable and will speed your recovery.  I provided 5 minutes of non face-to-face time during this encounter for chart review, medication and order placement, as well as and documentation.

## 2023-03-17 ENCOUNTER — Telehealth: Payer: Medicaid Other | Admitting: Physician Assistant

## 2023-03-17 DIAGNOSIS — N3090 Cystitis, unspecified without hematuria: Secondary | ICD-10-CM

## 2023-03-17 MED ORDER — CEPHALEXIN 500 MG PO CAPS: 500.0000 mg | ORAL_CAPSULE | Freq: Two times a day (BID) | ORAL | 0 refills | Status: AC

## 2023-03-17 NOTE — Progress Notes (Signed)

## 2023-04-16 ENCOUNTER — Telehealth: Admitting: Physician Assistant

## 2023-04-16 DIAGNOSIS — N390 Urinary tract infection, site not specified: Secondary | ICD-10-CM

## 2023-04-16 NOTE — Progress Notes (Signed)
  Because you were recently treated for a UTI/bladder infection last month and having recurrence of symptoms, I feel your condition warrants further evaluation and I recommend that you be seen in a face-to-face visit to have a urine culture obtained.   NOTE: There will be NO CHARGE for this E-Visit   If you are having a true medical emergency, please call 911.     For an urgent face to face visit, [place] has multiple urgent care centers for your convenience.  Click the link below for the full list of locations and hours, walk-in wait times, appointment scheduling options and driving directions:  Urgent Care - [place], [place], [place], [place], [place], [place]  [place]     Your MyChart E-visit questionnaire answers were reviewed by a board certified advanced clinical practitioner to complete your personal care plan based on your specific symptoms.    Thank you for using e-Visits.   I have spent 5 minutes in review of e-visit questionnaire, review and updating patient chart, medical decision making and response to patient.   Margaretann Loveless, PA-C

## 2023-04-18 ENCOUNTER — Telehealth: Admitting: Family Medicine

## 2023-04-18 DIAGNOSIS — H6690 Otitis media, unspecified, unspecified ear: Secondary | ICD-10-CM | POA: Diagnosis not present

## 2023-04-18 MED ORDER — AZITHROMYCIN 250 MG PO TABS: ORAL_TABLET | ORAL | 0 refills | Status: AC

## 2023-04-18 NOTE — Progress Notes (Signed)
 E-Visit for Ear Pain - Acute Otitis Media   We are sorry that you are not feeling well. Here is how we plan to help!  Based on what you have shared with me it looks like you have Acute Otitis Media.  Acute Otitis Media is an infection of the middle or "inner" ear. This type of infection can cause redness, inflammation, and fluid buildup behind the tympanic membrane (ear drum).  The usual symptoms include: Earache/Pain Fever Upper respiratory symptoms Lack of energy/Fatigue/Malaise Slight hearing loss gradually worsening- if the inner ear fills with fluid What causes middle ear infections? Most middle ear infections occur when an infection such as a cold, leads to a build-up of mucus in the middle ear and causes the Eustachian tube (a thin tube that runs from the middle ear to the back of the nose) to become swollen or blocked.   This means mucus can't drain away properly, making it easier for an infection to spread into the middle ear.  How middle ear infections are treated: Most ear infections clear up within three to five days and don't need any specific treatment. If necessary, tylenol or ibuprofen should be used to relieve pain and a high temperature.  If you develop a fever higher than 102, or any significantly worsening symptoms, this could indicate a more serious infection moving to the middle/inner and needs face to face evaluation in an office by a provider.   Antibiotics aren't routinely used to treat middle ear infections, although they may occasionally be prescribed if symptoms persist or are particularly severe. Given your presentation,   I have prescribed Azithromycin 250 mg two tablets by mouth on day 1, then 1 tablet by mouth daily until completed     Your symptoms should improve over the next 3 days and should resolve in about 7 days. Be sure to complete ALL of the prescription(s) given.  HOME CARE: Wash your hands frequently. If you are prescribed an ear drop, do not  place the tip of the bottle on your ear or touch it with your fingers. You can take Acetaminophen 650 mg every 4-6 hours as needed for pain.  If pain is severe or moderate, you can apply a heating pad (set on low) or hot water bottle (wrapped in a towel) to outer ear for 20 minutes.  This will also increase drainage.  GET HELP RIGHT AWAY IF: Fever is over 102.2 degrees. You develop progressive ear pain or hearing loss. Ear symptoms persist longer than 3 days after treatment.  MAKE SURE YOU: Understand these instructions. Will watch your condition. Will get help right away if you are not doing well or get worse.  Thank you for choosing an e-visit.  Your e-visit answers were reviewed by a board certified advanced clinical practitioner to complete your personal care plan. Depending upon the condition, your plan could have included both over the counter or prescription medications.  Please review your pharmacy choice. Make sure the pharmacy is open so you can pick up the prescription now. If there is a problem, you may contact your provider through [employer] and have the prescription routed to another pharmacy.  Your safety is important to [place]. If you have drug allergies check your prescription carefully.   For the next 24 hours you can use MyChart to ask questions about today's visit, request a non-urgent call back, or ask for a work or school excuse. You will get an email with a survey after your eVisit asking  about your experience. We would appreciate your feedback. I hope that your e-visit has been valuable and will aid in your recovery.     have provided 5 minutes of non face to face time during this encounter for chart review and documentation.

## 2023-05-07 ENCOUNTER — Encounter: Payer: Self-pay | Admitting: Family Medicine

## 2023-05-26 ENCOUNTER — Other Ambulatory Visit: Payer: Self-pay | Admitting: Family Medicine

## 2023-05-26 DIAGNOSIS — Z86718 Personal history of other venous thrombosis and embolism: Secondary | ICD-10-CM

## 2023-05-26 DIAGNOSIS — Z86711 Personal history of pulmonary embolism: Secondary | ICD-10-CM

## 2023-05-27 NOTE — Telephone Encounter (Signed)
 Requested medication (s) are due for refill today: yes  Requested medication (s) are on the active medication list: yes  Last refill:  12/06/22 #180/1  Future visit scheduled: no  Notes to clinic:  Unable to refill per protocol due to failed labs, no updated results.      Requested Prescriptions  Pending Prescriptions Disp Refills   ELIQUIS  5 MG TABS tablet [Pharmacy Med Name: ELIQUIS  5 MG TABLET] 180 tablet 1    Sig: TAKE 1 TABLET (5 MG TOTAL) BY MOUTH 2 (TWO) TIMES DAILY. + HETEROZYGOUS FVL     Hematology:  Anticoagulants - apixaban  Failed - 05/27/2023  3:39 PM      Failed - PLT in normal range and within 360 days    Platelets  Date Value Ref Range Status  05/26/2022 281 150 - 400 K/uL Final  02/06/2022 264 150 - 450 x10E3/uL Final         Failed - HGB in normal range and within 360 days    Hemoglobin  Date Value Ref Range Status  05/26/2022 14.5 12.0 - 15.0 g/dL Final  03/47/4259 56.3 11.1 - 15.9 g/dL Final         Failed - HCT in normal range and within 360 days    HCT  Date Value Ref Range Status  05/26/2022 45.0 36.0 - 46.0 % Final   Hematocrit  Date Value Ref Range Status  02/06/2022 40.9 34.0 - 46.6 % Final         Failed - Cr in normal range and within 360 days    Creatinine  Date Value Ref Range Status  12/28/2012 0.67 0.60 - 1.30 mg/dL Final   Creatinine, Ser  Date Value Ref Range Status  05/26/2022 0.73 0.44 - 1.00 mg/dL Final         Failed - AST in normal range and within 360 days    AST  Date Value Ref Range Status  05/26/2022 20 15 - 41 U/L Final   SGOT(AST)  Date Value Ref Range Status  12/28/2012 26 15 - 37 Unit/L Final         Failed - ALT in normal range and within 360 days    ALT  Date Value Ref Range Status  05/26/2022 24 0 - 44 U/L Final   SGPT (ALT)  Date Value Ref Range Status  12/28/2012 30 12 - 78 U/L Final         Failed - Valid encounter within last 12 months    Recent Outpatient Visits   None

## 2023-06-09 ENCOUNTER — Telehealth: Admitting: Physician Assistant

## 2023-06-09 DIAGNOSIS — R3989 Other symptoms and signs involving the genitourinary system: Secondary | ICD-10-CM

## 2023-06-09 MED ORDER — NITROFURANTOIN MONOHYD MACRO 100 MG PO CAPS
100.0000 mg | ORAL_CAPSULE | Freq: Two times a day (BID) | ORAL | 0 refills | Status: AC
Start: 2023-06-09 — End: ?

## 2023-06-09 NOTE — Progress Notes (Signed)

## 2023-06-19 ENCOUNTER — Ambulatory Visit: Admitting: Psychiatry

## 2023-06-26 ENCOUNTER — Telehealth: Admitting: Physician Assistant

## 2023-06-26 DIAGNOSIS — R399 Unspecified symptoms and signs involving the genitourinary system: Secondary | ICD-10-CM | POA: Diagnosis not present

## 2023-06-26 DIAGNOSIS — N390 Urinary tract infection, site not specified: Secondary | ICD-10-CM

## 2023-06-26 NOTE — Progress Notes (Signed)
  Because of persistent/recurring urinary symptoms despite treatment vie e-visit, I feel your condition warrants further evaluation and I recommend that you be seen in an in-person visit.   NOTE: There will be NO CHARGE for this E-Visit   If you are having a true medical emergency, please call 911.     For an urgent face to face visit, [place] has multiple urgent care centers for your convenience.  Click the link below for the full list of locations and hours, walk-in wait times, appointment scheduling options and driving directions:  Urgent Care - [place], [place], [place], [place], [place], [place]  [place]     Your MyChart E-visit questionnaire answers were reviewed by a board certified advanced clinical practitioner to complete your personal care plan based on your specific symptoms.    Thank you for using e-Visits.

## 2023-06-27 ENCOUNTER — Ambulatory Visit: Payer: Self-pay

## 2023-07-18 ENCOUNTER — Telehealth: Admitting: Physician Assistant

## 2023-07-18 DIAGNOSIS — L249 Irritant contact dermatitis, unspecified cause: Secondary | ICD-10-CM

## 2023-07-18 MED ORDER — TRIAMCINOLONE ACETONIDE 0.1 % EX CREA
1.0000 | TOPICAL_CREAM | Freq: Two times a day (BID) | CUTANEOUS | 0 refills | Status: DC
Start: 2023-07-18 — End: 2023-11-06

## 2023-07-18 NOTE — Progress Notes (Signed)
 E Visit for Rash  We are sorry that you are not feeling well. Here is how we plan to help!  Based on what you shared with me it looks like you have contact dermatitis.  Contact dermatitis is a skin rash caused by something that touches the skin and causes irritation or inflammation.  Your skin may be red, swollen, dry, cracked, and itch.  The rash should go away in a few days but can last a few weeks.  If you get a rash, it's important to figure out what caused it so the irritant can be avoided in the future. and I am prescribing triamcinolone 0.1 % cream -- apply to the affected area(s) in a thin layer, twice daily for up to 14 days. Do not apply to face, privates or armpit regions.    HOME CARE:  Take cool showers and avoid direct sunlight. Apply cool compress or wet dressings. Take a bath in an oatmeal bath.  Sprinkle content of one Aveeno packet under running faucet with comfortably warm water.  Bathe for 15-20 minutes, 1-2 times daily.  Pat dry with a towel. Do not rub the rash. Use hydrocortisone cream. Take an antihistamine like Benadryl  for widespread rashes that itch.  The adult dose of Benadryl  is 25-50 mg by mouth 4 times daily. Caution:  This type of medication may cause sleepiness.  Do not drink alcohol, drive, or operate dangerous machinery while taking antihistamines.  Do not take these medications if you have prostate enlargement.  Read package instructions thoroughly on all medications that you take.  GET HELP RIGHT AWAY IF:  Symptoms don't go away after treatment. Severe itching that persists. If you rash spreads or swells. If you rash begins to smell. If it blisters and opens or develops a yellow-brown crust. You develop a fever. You have a sore throat. You become short of breath.  MAKE SURE YOU:  Understand these instructions. Will watch your condition. Will get help right away if you are not doing well or get worse.  Thank you for choosing an e-visit.  Your  e-visit answers were reviewed by a board certified advanced clinical practitioner to complete your personal care plan. Depending upon the condition, your plan could have included both over the counter or prescription medications.  Please review your pharmacy choice. Make sure the pharmacy is open so you can pick up prescription now. If there is a problem, you may contact your provider through [employer] and have the prescription routed to another pharmacy.  Your safety is important to us . If you have drug allergies check your prescription carefully.   For the next 24 hours you can use MyChart to ask questions about today's visit, request a non-urgent call back, or ask for a work or school excuse. You will get an email in the next two days asking about your experience. I hope that your e-visit has been valuable and will speed your recovery.   I have spent 5 minutes in review of e-visit questionnaire, review and updating patient chart, medical decision making and response to patient.   Angelia Kelp, PA-C

## 2023-08-07 ENCOUNTER — Inpatient Hospital Stay
Admission: EM | Admit: 2023-08-07 | Discharge: 2023-08-10 | DRG: 418 | Disposition: A | Discharge status: Home / Self Care | Attending: Hospitalist | Admitting: Hospitalist

## 2023-08-07 ENCOUNTER — Other Ambulatory Visit: Payer: Self-pay

## 2023-08-07 ENCOUNTER — Emergency Department

## 2023-08-07 ENCOUNTER — Encounter: Payer: Self-pay | Admitting: *Deleted

## 2023-08-07 DIAGNOSIS — R1011 Right upper quadrant pain: Principal | ICD-10-CM

## 2023-08-07 DIAGNOSIS — Z832 Family history of diseases of the blood and blood-forming organs and certain disorders involving the immune mechanism: Secondary | ICD-10-CM

## 2023-08-07 DIAGNOSIS — K8000 Calculus of gallbladder with acute cholecystitis without obstruction: Principal | ICD-10-CM | POA: Diagnosis present

## 2023-08-07 DIAGNOSIS — Z880 Allergy status to penicillin: Secondary | ICD-10-CM

## 2023-08-07 DIAGNOSIS — K819 Cholecystitis, unspecified: Secondary | ICD-10-CM | POA: Insufficient documentation

## 2023-08-07 DIAGNOSIS — Z811 Family history of alcohol abuse and dependence: Secondary | ICD-10-CM

## 2023-08-07 DIAGNOSIS — Z8249 Family history of ischemic heart disease and other diseases of the circulatory system: Secondary | ICD-10-CM

## 2023-08-07 DIAGNOSIS — Z7901 Long term (current) use of anticoagulants: Secondary | ICD-10-CM

## 2023-08-07 DIAGNOSIS — F32A Depression, unspecified: Secondary | ICD-10-CM | POA: Diagnosis present

## 2023-08-07 DIAGNOSIS — Z823 Family history of stroke: Secondary | ICD-10-CM

## 2023-08-07 DIAGNOSIS — K838 Other specified diseases of biliary tract: Secondary | ICD-10-CM | POA: Diagnosis not present

## 2023-08-07 DIAGNOSIS — F419 Anxiety disorder, unspecified: Secondary | ICD-10-CM | POA: Diagnosis present

## 2023-08-07 DIAGNOSIS — F17210 Nicotine dependence, cigarettes, uncomplicated: Secondary | ICD-10-CM | POA: Diagnosis present

## 2023-08-07 DIAGNOSIS — D6851 Activated protein C resistance: Secondary | ICD-10-CM | POA: Diagnosis present

## 2023-08-07 DIAGNOSIS — K8020 Calculus of gallbladder without cholecystitis without obstruction: Secondary | ICD-10-CM | POA: Diagnosis not present

## 2023-08-07 DIAGNOSIS — I10 Essential (primary) hypertension: Secondary | ICD-10-CM | POA: Diagnosis present

## 2023-08-07 DIAGNOSIS — Z820 Family history of epilepsy and other diseases of the nervous system: Secondary | ICD-10-CM

## 2023-08-07 DIAGNOSIS — Z86711 Personal history of pulmonary embolism: Secondary | ICD-10-CM

## 2023-08-07 DIAGNOSIS — Z79899 Other long term (current) drug therapy: Secondary | ICD-10-CM

## 2023-08-07 DIAGNOSIS — K810 Acute cholecystitis: Secondary | ICD-10-CM | POA: Diagnosis not present

## 2023-08-07 DIAGNOSIS — K828 Other specified diseases of gallbladder: Secondary | ICD-10-CM | POA: Diagnosis not present

## 2023-08-07 DIAGNOSIS — Z86718 Personal history of other venous thrombosis and embolism: Secondary | ICD-10-CM

## 2023-08-07 DIAGNOSIS — E876 Hypokalemia: Secondary | ICD-10-CM | POA: Diagnosis present

## 2023-08-07 DIAGNOSIS — Z803 Family history of malignant neoplasm of breast: Secondary | ICD-10-CM

## 2023-08-07 DIAGNOSIS — R1010 Upper abdominal pain, unspecified: Secondary | ICD-10-CM | POA: Diagnosis not present

## 2023-08-07 DIAGNOSIS — R21 Rash and other nonspecific skin eruption: Secondary | ICD-10-CM | POA: Diagnosis present

## 2023-08-07 LAB — COMPREHENSIVE METABOLIC PANEL WITH GFR
ALT: 21 U/L (ref 0–44)
AST: 21 U/L (ref 15–41)
Albumin: 4.5 g/dL (ref 3.5–5.0)
Alkaline Phosphatase: 70 U/L (ref 38–126)
Anion gap: 11 (ref 5–15)
BUN: 7 mg/dL (ref 6–20)
CO2: 24 mmol/L (ref 22–32)
Calcium: 9.5 mg/dL (ref 8.9–10.3)
Chloride: 103 mmol/L (ref 98–111)
Creatinine, Ser: 0.72 mg/dL (ref 0.44–1.00)
GFR, Estimated: >60 mL/min (ref >60)
Glucose, Bld: 84 mg/dL (ref 70–99)
Potassium: 3.5 mmol/L (ref 3.5–5.1)
Sodium: 138 mmol/L (ref 135–145)
Total Bilirubin: 0.4 mg/dL (ref 0.0–1.2)
Total Protein: 8.3 g/dL — ABNORMAL HIGH (ref 6.5–8.1)

## 2023-08-07 LAB — CBC
HCT: 45.2 % (ref 36.0–46.0)
Hemoglobin: 15.4 g/dL — ABNORMAL HIGH (ref 12.0–15.0)
MCH: 30.9 pg (ref 26.0–34.0)
MCHC: 34.1 g/dL (ref 30.0–36.0)
MCV: 90.6 fL (ref 80.0–100.0)
Platelets: 227 10*3/uL (ref 150–400)
RBC: 4.99 MIL/uL (ref 3.87–5.11)
RDW: 13.1 % (ref 11.5–15.5)
WBC: 8.1 10*3/uL (ref 4.0–10.5)
nRBC: 0 % (ref 0.0–0.2)

## 2023-08-07 LAB — URINALYSIS, ROUTINE W REFLEX MICROSCOPIC
Bilirubin Urine: NEGATIVE
Glucose, UA: NEGATIVE mg/dL
Ketones, ur: NEGATIVE mg/dL
Nitrite: NEGATIVE
Protein, ur: NEGATIVE mg/dL
Specific Gravity, Urine: 1.004 — ABNORMAL LOW (ref 1.005–1.030)
pH: 6 (ref 5.0–8.0)

## 2023-08-07 LAB — POC URINE PREG, ED: Preg Test, Ur: NEGATIVE

## 2023-08-07 LAB — LIPASE, BLOOD: Lipase: 24 U/L (ref 11–51)

## 2023-08-07 MED ORDER — LIDOCAINE VISCOUS HCL 2 % MT SOLN
15.0000 mL | Freq: Once | OROMUCOSAL | Status: AC
  Administered 2023-08-07: 15 mL via ORAL
  Filled 2023-08-07: qty 15

## 2023-08-07 MED ORDER — KETOROLAC TROMETHAMINE 15 MG/ML IJ SOLN
15.0000 mg | Freq: Once | INTRAMUSCULAR | Status: DC
  Filled 2023-08-07: qty 1

## 2023-08-07 MED ORDER — ALUM & MAG HYDROXIDE-SIMETH 200-200-20 MG/5ML PO SUSP
30.0000 mL | Freq: Once | ORAL | Status: AC
  Administered 2023-08-07: 30 mL via ORAL
  Filled 2023-08-07: qty 30

## 2023-08-07 MED ORDER — ACETAMINOPHEN 500 MG PO TABS
1000.0000 mg | ORAL_TABLET | Freq: Once | ORAL | Status: AC
  Administered 2023-08-07: 1000 mg via ORAL
  Filled 2023-08-07: qty 2

## 2023-08-07 NOTE — ED Triage Notes (Signed)
 Pt has right upper abd pain.  Pt reports nausea.  Hx gall stones.  Sx for 2 weeks.  Pt alert.

## 2023-08-07 NOTE — ED Provider Notes (Signed)
 Jasmine Gray Provider Note    Event Date/Time   First MD Initiated Contact with Patient 08/07/23 2257     (approximate)   History   Abdominal Pain   HPI  Jasmine Gray is a 33 y.o. female with history of PE on Eliquis , hypertension, GERD, gallstones, presenting with right upper quadrant abdominal pain.  Patient states that it has been intermittent for last 4 days, no nausea vomiting or diarrhea, no chest pain or shortness of breath.  Has been compliant with her medications.  He denies any urinary symptoms.  States that the pain radiates from her right upper quadrant to her flank to her back.  No fever.  On independent chart review, she did have a right upper quadrant ultrasound done in 2023 that showed mobile gallstones within the gallbladder measuring up to 1.8 cm, no wall thickening or sonographic Murphy sign, CBD was normal.     Physical Exam   Triage Vital Signs: ED Triage Vitals  Encounter Vitals Group     BP 08/07/23 2122 (!) 123/97     Girls Systolic BP Percentile --      Girls Diastolic BP Percentile --      Boys Systolic BP Percentile --      Boys Diastolic BP Percentile --      Pulse Rate 08/07/23 2122 (!) 101     Resp 08/07/23 2122 18     Temp 08/07/23 2122 98 F (36.7 C)     Temp Source 08/07/23 2122 Oral     SpO2 08/07/23 2122 100 %     Weight 08/07/23 2119 210 lb (95.3 kg)     Height 08/07/23 2119 5' 7 (1.702 m)     Head Circumference --      Peak Flow --      Pain Score 08/07/23 2119 8     Pain Loc --      Pain Education --      Exclude from Growth Chart --     Most recent vital signs: Vitals:   08/07/23 2122  BP: (!) 123/97  Pulse: (!) 101  Resp: 18  Temp: 98 F (36.7 C)  SpO2: 100%     General: Awake, no distress.  CV:  Good peripheral perfusion.  Resp:  Normal effort.  Abd:  No distention.  Soft, mildly tender to the right upper quadrant, no flank or CVA tenderness Other:  No overlying rash.   ED Results /  Procedures / Treatments   Labs (all labs ordered are listed, but only abnormal results are displayed) Labs Reviewed  COMPREHENSIVE METABOLIC PANEL WITH GFR - Abnormal; Notable for the following components:      Result Value   Total Protein 8.3 (*)    All other components within normal limits  CBC - Abnormal; Notable for the following components:   Hemoglobin 15.4 (*)    All other components within normal limits  URINALYSIS, ROUTINE W REFLEX MICROSCOPIC - Abnormal; Notable for the following components:   Color, Urine STRAW (*)    APPearance CLEAR (*)    Specific Gravity, Urine 1.004 (*)    Hgb urine dipstick SMALL (*)    Leukocytes,Ua MODERATE (*)    Bacteria, UA RARE (*)    All other components within normal limits  LIPASE, BLOOD  POC URINE PREG, ED      RADIOLOGY On my independent interpretation, right upper quadrant ultrasound shows multiple gallstones with thickened gallbladder wall, concerning for cholecystitis  PROCEDURES:  Critical Care performed: No  Procedures   MEDICATIONS ORDERED IN ED: Medications  ketorolac  (TORADOL ) 15 MG/ML injection 15 mg (15 mg Intramuscular Patient Refused/Not Given 08/07/23 2321)  cefTRIAXone  (ROCEPHIN ) 1 g in sodium chloride  0.9 % 100 mL IVPB (1 g Intravenous New Bag/Given 08/08/23 0028)  metroNIDAZOLE  (FLAGYL ) IVPB 500 mg (has no administration in time range)  acetaminophen  (TYLENOL ) tablet 1,000 mg (1,000 mg Oral Given 08/07/23 2320)  alum & mag hydroxide-simeth (MAALOX/MYLANTA) 200-200-20 MG/5ML suspension 30 mL (30 mLs Oral Given 08/07/23 2320)    And  lidocaine  (XYLOCAINE ) 2 % viscous mouth solution 15 mL (15 mLs Oral Given 08/07/23 2320)  morphine  (PF) 2 MG/ML injection 2 mg (2 mg Intravenous Given 08/08/23 0025)     IMPRESSION / MDM / ASSESSMENT AND PLAN / ED COURSE  I reviewed the triage vital signs and the nursing notes.                              Differential diagnosis includes, but is not limited to, biliary colic,  cholecystitis, GERD, nephrolithiasis, UTI.  Will get labs, UA, pregnancy test, right upper quadrant ultrasound.  Will give her a GI cocktail here as well as some Tylenol  and reassess.  Patient's presentation is most consistent with acute presentation with potential threat to life or bodily function.  Independent interpretation of labs and imaging below.  Given findings of cholecystitis, or start her on ceftriaxone  and Flagyl , will give her some additional pain meds and contact surgery.  Clinical course as below, consulted surgery who states that because she is on the Eliquis , not taking it or at this time, recommended hospitalist admission for IV antibiotics, he will reassess her in the morning to decide if she needs a percutaneous drain.  Consult to hospitalist was agreeable with the plan for admission and will evaluate the patient.  She is admitted.  Will keep her n.p.o.     Clinical Course as of 08/08/23 0030  Fri Aug 08, 2023  0003 US  ABDOMEN LIMITED RUQ (LIVER/GB) IMPRESSION: Cholelithiasis and gallbladder sludge, in the setting of mild gallbladder wall thickening and a positive sonographic Murphy's sign, consistent with acute cholecystiti   [TT]  0007 Independent review of labs, electrolytes not severely deranged, LFTs are normal, no leukocytosis, UA does show moderate leukocytes, 6-10 WBCs and rare bacteria, pregnancy test is negative, lipase is normal. [TT]  0011 Consulted Dr. Desiderio from surgery, states that since she has history of PE/DVT on Eliquis , recommended hospitalist admission for antibiotics and reassessment in the morning, she might need percutaneous drain, unable to take her to the OR given that she is on Eliquis  at this time.  Will contact the hospitalist for admission. [TT]    Clinical Course User Index [TT] Waymond Lorelle Cummins, MD     FINAL CLINICAL IMPRESSION(S) / ED DIAGNOSES   Final diagnoses:  Right upper quadrant abdominal pain  Cholecystitis     Rx / DC Orders    ED Discharge Orders     None        Note:  This document was prepared using Dragon voice recognition software and may include unintentional dictation errors.    Waymond Lorelle Cummins, MD 08/08/23 SH[place]

## 2023-08-08 DIAGNOSIS — K819 Cholecystitis, unspecified: Secondary | ICD-10-CM | POA: Insufficient documentation

## 2023-08-08 DIAGNOSIS — Z7901 Long term (current) use of anticoagulants: Secondary | ICD-10-CM

## 2023-08-08 DIAGNOSIS — K8000 Calculus of gallbladder with acute cholecystitis without obstruction: Secondary | ICD-10-CM

## 2023-08-08 DIAGNOSIS — K810 Acute cholecystitis: Secondary | ICD-10-CM | POA: Diagnosis not present

## 2023-08-08 LAB — TYPE AND SCREEN
ABO/RH(D): A POS
Antibody Screen: NEGATIVE

## 2023-08-08 LAB — HEPARIN LEVEL (UNFRACTIONATED): Heparin Unfractionated: 0.93 [IU]/mL — ABNORMAL HIGH (ref 0.30–0.70)

## 2023-08-08 LAB — APTT
aPTT: 51 s — ABNORMAL HIGH (ref 24–36)
aPTT: 80 s — ABNORMAL HIGH (ref 24–36)

## 2023-08-08 LAB — HIV ANTIBODY (ROUTINE TESTING W REFLEX): HIV Screen 4th Generation wRfx: NONREACTIVE

## 2023-08-08 MED ORDER — TRIAMCINOLONE 0.1 % CREAM:EUCERIN CREAM 1:1
TOPICAL_CREAM | Freq: Two times a day (BID) | CUTANEOUS | Status: DC
  Filled 2023-08-08: qty 60
  Filled 2023-08-08: qty 1

## 2023-08-08 MED ORDER — INDOCYANINE GREEN 25 MG IV SOLR
1.2500 mg | Freq: Once | INTRAVENOUS | Status: AC
  Administered 2023-08-09: 1.25 mg via INTRAVENOUS
  Filled 2023-08-08: qty 10

## 2023-08-08 MED ORDER — KETOROLAC TROMETHAMINE 30 MG/ML IJ SOLN
30.0000 mg | Freq: Four times a day (QID) | INTRAMUSCULAR | Status: DC | PRN
  Filled 2023-08-08: qty 1

## 2023-08-08 MED ORDER — SODIUM CHLORIDE 0.9 % IV SOLN: INTRAVENOUS | Status: AC

## 2023-08-08 MED ORDER — VENLAFAXINE HCL 37.5 MG PO TABS: 37.5000 mg | ORAL_TABLET | Freq: Two times a day (BID) | ORAL | Status: DC

## 2023-08-08 MED ORDER — CALCIUM CARBONATE ANTACID 500 MG PO CHEW
1.0000 | CHEWABLE_TABLET | Freq: Three times a day (TID) | ORAL | Status: DC | PRN
  Administered 2023-08-09: 200 mg via ORAL
  Filled 2023-08-08 (×3): qty 1

## 2023-08-08 MED ORDER — HEPARIN BOLUS VIA INFUSION
2400.0000 [IU] | Freq: Once | INTRAVENOUS | Status: AC
  Administered 2023-08-08: 2400 [IU] via INTRAVENOUS
  Filled 2023-08-08: qty 2400

## 2023-08-08 MED ORDER — METRONIDAZOLE 500 MG/100ML IV SOLN
500.0000 mg | Freq: Two times a day (BID) | INTRAVENOUS | Status: DC
  Administered 2023-08-08 – 2023-08-10 (×5): 500 mg via INTRAVENOUS
  Filled 2023-08-08 (×5): qty 100

## 2023-08-08 MED ORDER — ONDANSETRON HCL 4 MG PO TABS
4.0000 mg | ORAL_TABLET | Freq: Four times a day (QID) | ORAL | Status: DC | PRN
Start: 2023-08-08 — End: 2023-08-10

## 2023-08-08 MED ORDER — ONDANSETRON HCL 4 MG/2ML IJ SOLN: 4.0000 mg | Freq: Four times a day (QID) | INTRAMUSCULAR | Status: DC | PRN

## 2023-08-08 MED ORDER — CHLORHEXIDINE GLUCONATE CLOTH 2 % EX PADS
6.0000 | MEDICATED_PAD | Freq: Every day | CUTANEOUS | Status: DC
  Administered 2023-08-09: 6 via TOPICAL

## 2023-08-08 MED ORDER — ONDANSETRON HCL 4 MG/2ML IJ SOLN
4.0000 mg | Freq: Once | INTRAMUSCULAR | Status: AC
  Administered 2023-08-08: 4 mg via INTRAVENOUS
  Filled 2023-08-08: qty 2

## 2023-08-08 MED ORDER — MORPHINE SULFATE (PF) 2 MG/ML IV SOLN
2.0000 mg | INTRAVENOUS | Status: DC | PRN
  Administered 2023-08-08: 2 mg via INTRAVENOUS
  Filled 2023-08-08: qty 1

## 2023-08-08 MED ORDER — ALPRAZOLAM 0.25 MG PO TABS: 0.2500 mg | ORAL_TABLET | Freq: Two times a day (BID) | ORAL | Status: DC | PRN

## 2023-08-08 MED ORDER — LABETALOL HCL 100 MG PO TABS
100.0000 mg | ORAL_TABLET | Freq: Two times a day (BID) | ORAL | Status: DC
  Administered 2023-08-08 – 2023-08-10 (×4): 100 mg via ORAL
  Filled 2023-08-08 (×6): qty 1

## 2023-08-08 MED ORDER — OXYCODONE HCL 5 MG PO TABS
5.0000 mg | ORAL_TABLET | ORAL | Status: DC | PRN
  Administered 2023-08-08: 5 mg via ORAL
  Filled 2023-08-08: qty 1

## 2023-08-08 MED ORDER — SODIUM CHLORIDE 0.9 % IV SOLN
1.0000 g | Freq: Once | INTRAVENOUS | Status: AC
  Administered 2023-08-08: 1 g via INTRAVENOUS
  Filled 2023-08-08: qty 10

## 2023-08-08 MED ORDER — METRONIDAZOLE 500 MG/100ML IV SOLN
500.0000 mg | Freq: Once | INTRAVENOUS | Status: AC
  Administered 2023-08-08: 500 mg via INTRAVENOUS
  Filled 2023-08-08: qty 100

## 2023-08-08 MED ORDER — FAMOTIDINE 20 MG PO TABS
20.0000 mg | ORAL_TABLET | Freq: Every day | ORAL | Status: DC
  Administered 2023-08-08 – 2023-08-10 (×2): 20 mg via ORAL
  Filled 2023-08-08 (×2): qty 1

## 2023-08-08 MED ORDER — ACETAMINOPHEN 650 MG RE SUPP: 650.0000 mg | Freq: Four times a day (QID) | RECTAL | Status: DC | PRN

## 2023-08-08 MED ORDER — SODIUM CHLORIDE 0.9 % IV SOLN
1.0000 g | INTRAVENOUS | Status: DC
  Administered 2023-08-08 – 2023-08-09 (×2): 1 g via INTRAVENOUS
  Filled 2023-08-08 (×2): qty 10

## 2023-08-08 MED ORDER — MORPHINE SULFATE (PF) 2 MG/ML IV SOLN
2.0000 mg | Freq: Once | INTRAVENOUS | Status: AC
  Administered 2023-08-08: 2 mg via INTRAVENOUS
  Filled 2023-08-08: qty 1

## 2023-08-08 MED ORDER — HEPARIN (PORCINE) 25000 UT/250ML-% IV SOLN
1600.0000 [IU]/h | INTRAVENOUS | Status: AC
  Administered 2023-08-08: 1400 [IU]/h via INTRAVENOUS
  Administered 2023-08-08: 1600 [IU]/h via INTRAVENOUS
  Filled 2023-08-08 (×2): qty 250

## 2023-08-08 MED ORDER — ACETAMINOPHEN 325 MG PO TABS
650.0000 mg | ORAL_TABLET | Freq: Four times a day (QID) | ORAL | Status: DC | PRN
Start: 2023-08-08 — End: 2023-08-09
  Administered 2023-08-08 – 2023-08-09 (×3): 650 mg via ORAL
  Filled 2023-08-08 (×3): qty 2

## 2023-08-08 MED ORDER — SODIUM CHLORIDE 0.9 % IV SOLN: 12.5000 mg | Freq: Four times a day (QID) | INTRAVENOUS | Status: DC | PRN

## 2023-08-08 NOTE — Progress Notes (Addendum)
 Pt has a rash and bump on left forearm. Per patient  I have this rash and bumps for 2 weeks now and it get itchy at times. NP Marinell made aware.  Update 0410: Pt is requesting medicine for pain control. Per pt  morphine  just helps a little bit. NP Jesus made aware.   Update 0424: See new orders.

## 2023-08-08 NOTE — Progress Notes (Signed)
 PHARMACY - ANTICOAGULATION CONSULT NOTE  Pharmacy Consult for Heparin   Indication: pulmonary embolus and Bridging Eliquis  (possible surgery)   Allergies  Allergen Reactions   Penicillins Hives    Patient Measurements: Height: 5' 7 (170.2 cm) Weight: 95.3 kg (210 lb) IBW/kg (Calculated) : 61.6 HEPARIN  DW (KG): 82.5  Vital Signs: Temp: 98 F (36.7 C) (07/03 2122) Temp Source: Oral (07/03 2122) BP: 123/97 (07/03 2122) Pulse Rate: 101 (07/03 2122)  Labs: Recent Labs    08/07/23 2121  HGB 15.4*  HCT 45.2  PLT 227  CREATININE 0.72    Estimated Creatinine Clearance: 120.8 mL/min (by C-G formula based on SCr of 0.72 mg/dL).   Medical History: Past Medical History:  Diagnosis Date   Acid reflux    on no RX at present   Anemia 12/03/2022   Blood transfusion without reported diagnosis 03/03/2022   Depression 03/05/2022   Hemorrhoid 11/2015   starting on past Sat. Pt states it is on the outside   Hypertension    Tobacco abuse     Medications:  (Not in a hospital admission)   Assessment: Pharmacy consulted to dose heparin  in this 33 year old female admitted with cholecystitis.  Pt was on Eliquis  5 mg PO BID PTA for PE, last dose on 7/3 @ 1830. CrCl = 120.8 ml/min   Goal of Therapy:  Heparin  level 0.3-0.7 units/ml aPTT 66 - 102 seconds Monitor platelets by anticoagulation protocol: Yes   Plan:  Heparin  drip to start @ 1400 units/hr on 7/4 @ 0630.  No bolus due to Eliquis  PTA. - will use aPTT to guide dosing until correlating with HL - will draw aPTT and HL 6 hrs after start of drip - F/U with provider regarding plans for surgery - CBC daily   Trypp Heckmann D 08/08/2023,1:01 AM

## 2023-08-08 NOTE — Assessment & Plan Note (Addendum)
 Heterozygous factor V Leiden  History of DVT PE Heparin  bridge in anticipation of procedure/surgery Holding Eliquis -last dose was on 7/3 PM dose

## 2023-08-08 NOTE — Assessment & Plan Note (Signed)
-   Continue home labetalol

## 2023-08-08 NOTE — Assessment & Plan Note (Addendum)
 N.p.o. IV fluids Pain meds, antiemetics Continue Flagyl  and Rocephin  ordered by ED Heparin  bridge and hold Eliquis  IR consulted for possible percutaneous cholecystostomy Surgery consulted from the ED-Dr. Desiderio Formal surgical consult placed

## 2023-08-08 NOTE — Progress Notes (Signed)
  PROGRESS NOTE    Jasmine Gray  FMW:[id number] DOB: 04-20-1990 DOA: 08/07/2023 PCP: Donzella Lauraine SAILOR, DO  [id number]  LOS: 0 days   Brief hospital course:   Assessment & Plan: Jasmine Gray is a 33 y.o. 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  She presented to the ED with a 4-day history of intermittent right upper quadrant pain associated with nausea and without vomiting.    Right upper quadrant ultrasound showing cholelithiasis and gallbladder sludge, in the setting of mild gallbladder wall thickening and a positive sonographic Murphy's sign, consistent with acute cholecystitis   * Acute Cholecystitis --cholecystectomy tomorrow with Dr. Jordis --cont empiric ceftriaxone  and flagyl   Chronic anticoagulation Heterozygous factor V Leiden  History of DVT PE --hold home Eliquis  --heparin  gtt for bridging   Essential hypertension Continue home labetalol   Anxiety and depression Continue as needed alprazolam   Rash over left forearm --triamcinolone  cream   DVT prophylaxis: On:heparin  gtt Code Status: Full code  Family Communication: boyfriend updated at bedside today Level of care: Med-Surg Dispo:   The patient is from: home Anticipated d/c is to: home Anticipated d/c date is: 2 days   Subjective and Interval History:  Pt reported abdominal pain improved.   Objective: Vitals:   08/08/23 0805 08/08/23 0859 08/08/23 1301 08/08/23 1439  BP: 105/62 132/83 118/64 122/78  Pulse: 72  61 61  Resp: 18  18 18   Temp: 98.3 F (36.8 C)  98.3 F (36.8 C) 98.5 F (36.9 C)  TempSrc:    Oral  SpO2: 100%  100% 100%  Weight:      Height:        Intake/Output Summary (Last 24 hours) at 08/08/2023 1849 Last data filed at 08/08/2023 1423 Gross per 24 hour  Intake 998.56 ml  Output --  Net 998.56 ml   Filed Weights    08/07/23 2119 08/08/23 0121  Weight: 95.3 kg 95.1 kg    Examination:   Constitutional: NAD, AAOx3 HEENT: conjunctivae and lids normal, EOMI CV: No cyanosis.   RESP: normal respiratory effort, on RA SKIN: scattered pin-point rash over left forearm  Neuro: II - XII grossly intact.   Psych: Normal mood and affect.  Appropriate judgement and reason   Data Reviewed: I have personally reviewed labs and imaging studies   Ellouise Haber, MD [facility] If 7PM-7AM, please contact night-coverage 08/08/2023, 6:49 PM

## 2023-08-08 NOTE — Progress Notes (Signed)
 Case discussed with Dr. Tan.  Patient comes in with RUQ abdominal pain, without nausea/vomiting.  Has history of gallstones.  Has history of Factor V Leiden and bilateral PE, currently on Eliquis .  U/S in ED showed sludge and stones, with possible stone in the neck of the gallbladder, wall thickening of 4 mm, and positive Murphy's sign.  LFTs normal, WBC normal.  Given her current Eliquis , would not recommend cholecystectomy at this point.  Can be admitted to medical team.  Might be able to wait for Eliquis  to wear off or may need percutaneous cholecystostomy drain if IV antibiotics alone do not help.  Will continue following with you.  Aloysius Plant, MD

## 2023-08-08 NOTE — Plan of Care (Signed)
  Problem: Education: Goal: Knowledge of General Education information will improve Description: Including pain rating scale, medication(s)/side effects and non-pharmacologic comfort measures Outcome: Progressing   Problem: Health Behavior/Discharge Planning: Goal: Ability to manage health-related needs will improve Outcome: Progressing   Problem: Clinical Measurements: Goal: Respiratory complications will improve Outcome: Progressing   Problem: Activity: Goal: Risk for activity intolerance will decrease Outcome: Progressing   Problem: Pain Managment: Goal: General experience of comfort will improve and/or be controlled Outcome: Progressing

## 2023-08-08 NOTE — Plan of Care (Signed)
  Problem: Education: Goal: Knowledge of General Education information will improve Description: Including pain rating scale, medication(s)/side effects and non-pharmacologic comfort measures Outcome: Progressing   Problem: Health Behavior/Discharge Planning: Goal: Ability to manage health-related needs will improve Outcome: Progressing   Problem: Clinical Measurements: Goal: Ability to maintain clinical measurements within normal limits will improve Outcome: Progressing Goal: Will remain free from infection Outcome: Progressing Goal: Diagnostic test results will improve Outcome: Progressing Goal: Respiratory complications will improve Outcome: Progressing Goal: Cardiovascular complication will be avoided Outcome: Progressing   Problem: Clinical Measurements: Goal: Ability to maintain clinical measurements within normal limits will improve Outcome: Progressing Goal: Will remain free from infection Outcome: Progressing Goal: Diagnostic test results will improve Outcome: Progressing Goal: Respiratory complications will improve Outcome: Progressing Goal: Cardiovascular complication will be avoided Outcome: Progressing   Problem: Activity: Goal: Risk for activity intolerance will decrease Outcome: Progressing   Problem: Nutrition: Goal: Adequate nutrition will be maintained Outcome: Progressing   Problem: Coping: Goal: Level of anxiety will decrease Outcome: Progressing   Problem: Elimination: Goal: Will not experience complications related to bowel motility Outcome: Progressing Goal: Will not experience complications related to urinary retention Outcome: Progressing

## 2023-08-08 NOTE — ED Notes (Signed)
 Pt has a rash and bumps on Lt forearm.. pt stated that its been there for the past 2 weeks

## 2023-08-08 NOTE — Progress Notes (Signed)
 PHARMACY - ANTICOAGULATION CONSULT NOTE  Pharmacy Consult for Heparin   Indication: pulmonary embolus   Allergies  Allergen Reactions   Penicillins Hives    Patient Measurements: Height: 5' 7 (170.2 cm) Weight: 95.1 kg (209 lb 10.5 oz) IBW/kg (Calculated) : 61.6 HEPARIN  DW (KG): 82.4  Vital Signs: Temp: 98.3 F (36.8 C) (07/04 1301) BP: 118/64 (07/04 1301) Pulse Rate: 61 (07/04 1301)  Labs: Recent Labs    08/07/23 2121 08/08/23 1244  HGB 15.4*  --   HCT 45.2  --   PLT 227  --   APTT  --  51*  HEPARINUNFRC  --  0.93*  CREATININE 0.72  --     Estimated Creatinine Clearance: 120.6 mL/min (by C-G formula based on SCr of 0.72 mg/dL).   Medical History: Past Medical History:  Diagnosis Date   Acid reflux    on no RX at present   Anemia 12/03/2022   Blood transfusion without reported diagnosis 03/03/2022   Depression 03/05/2022   Hemorrhoid 11/2015   starting on past Sat. Pt states it is on the outside   Hypertension    Tobacco abuse     Medications:  Medications Prior to Admission  Medication Sig Dispense Refill Last Dose/Taking   apixaban  (ELIQUIS ) 5 MG TABS tablet TAKE 1 TABLET (5 MG TOTAL) BY MOUTH 2 (TWO) TIMES DAILY. + HETEROZYGOUS FVL 180 tablet 0 08/07/2023 at  8:30 PM   labetalol  (NORMODYNE ) 100 MG tablet Take 1 tablet (100 mg total) by mouth 2 (two) times daily. 180 tablet 3 08/07/2023 at  8:30 PM   triamcinolone  cream (KENALOG ) 0.1 % Apply 1 Application topically 2 (two) times daily. 30 g 0 08/07/2023   ALPRAZolam  (XANAX ) 0.25 MG tablet Take 1 tablet (0.25 mg total) by mouth 2 (two) times daily as needed for anxiety. (Patient not taking: Reported on 08/08/2023) 20 tablet 0 Not Taking   hydrOXYzine  (ATARAX ) 25 MG tablet Take 1 tablet (25 mg total) by mouth every 6 (six) hours as needed for itching. (Patient not taking: Reported on 08/08/2023) 30 tablet 2 Not Taking   venlafaxine  (EFFEXOR ) 37.5 MG tablet Take 1 tablet (37.5 mg total) by mouth 2 (two) times daily.  (Patient not taking: Reported on 08/08/2023) 180 tablet 1 Not Taking    Assessment: Pharmacy consulted to dose heparin  in this 33 year old female admitted with cholecystitis.  Pt was on Eliquis  5 mg PO BID PTA for PE, last dose on 7/3 @ 1830.  0704 1244 aPTT 51 HL 0.93   Goal of Therapy:  Heparin  level 0.3-0.7 units/ml aPTT 66 - 102 seconds Monitor platelets by anticoagulation protocol: Yes   Plan:  aPTT is supratherapeutic. Will give heparin  bolus of 2400 units x 1 and increase heparin  infusion to 1600 units/hr. Recheck aPTT in 6 hours. Heparin  level and CBC with AM labs. Switch to heparin  level monitoring once aPTT and heparin  levels correlate.   Jasmine Gray, PharmD, BCPS 08/08/2023,1:53 PM

## 2023-08-08 NOTE — Consult Note (Signed)
 Patient ID: Jasmine Gray, female   DOB: May 27, 1990, 33 y.o.   MRN: [id number]  HPI Jasmine Gray is a 33 y.o. female seen in consultation at the request of Dr. Cleatus.  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  She presented to the ED with a 4-day history of intermittent right upper quadrant pain associated with nausea and without vomiting.  Her pain is intermittent moderate intensity and has intensified over the last couple of days.  Now is moderate to severe.  No specific alleviating or aggravating factors.  No fevers no jaundice.  Labs with normal CBC, lipase and CMP and urinalysis showing moderate leuks. Pregnancy test negative.  She did have an ultrasound that have personally reviewed showing evidence of cholelithiasis with mild wall thickening. Prior history of C-section HPI  Past Medical History:  Diagnosis Date   Acid reflux    on no RX at present   Anemia 12/03/2022   Blood transfusion without reported diagnosis 03/03/2022   Depression 03/05/2022   Hemorrhoid 11/2015   starting on past Sat. Pt states it is on the outside   Hypertension    Tobacco abuse     Past Surgical History:  Procedure Laterality Date   CESAREAN SECTION  03/03/2022   Procedure: CESAREAN SECTION;  Surgeon: Janit Alm Agent, MD;  Location: [facility] ORS;  Service: Obstetrics;;    Family History  Problem Relation Age of Onset   Hypertension Mother    Migraines Mother    Heart disease Father    Hypertension Father    Congestive Heart Failure Father    Stroke Father    Factor V Leiden deficiency Father    Cancer Maternal Grandmother 79       breast   Alcohol abuse Maternal Grandfather    Hypertension Paternal Grandfather    Depression Neg Hx     Social History Social History   Tobacco Use   Smoking status: Every Day    Current packs/day: 0.50    Average packs/day: 0.5 packs/day  for 10.0 years (5.0 ttl pk-yrs)    Types: Cigarettes    Passive exposure: Current   Smokeless tobacco: Never  Vaping Use   Vaping status: Never Used  Substance Use Topics   Alcohol use: Never   Drug use: Never    Allergies  Allergen Reactions   Penicillins Hives    Current Facility-Administered Medications  Medication Dose Route Frequency Provider Last Rate Last Admin   0.9 %  sodium chloride  infusion   Intravenous Continuous Cleatus Delayne GAILS, MD 75 mL/hr at 08/08/23 0135 New Bag at 08/08/23 0135   acetaminophen  (TYLENOL ) tablet 650 mg  650 mg Oral Q6H PRN Duncan, Hazel V, MD   650 mg at 08/08/23 9355   Or   acetaminophen  (TYLENOL ) suppository 650 mg  650 mg Rectal Q6H PRN Cleatus Delayne GAILS, MD       ALPRAZolam  (XANAX ) tablet 0.25 mg  0.25 mg Oral BID PRN Duncan, Hazel V, MD       [START ON 08/09/2023] cefTRIAXone  (ROCEPHIN ) 1 g in sodium chloride  0.9 % 100 mL IVPB  1 g Intravenous Q24H Cleatus Delayne V, MD       heparin  ADULT infusion 100 units/mL (25000 units/250mL)  1,400 Units/hr Intravenous Continuous Cleatus Delayne GAILS, MD 14 mL/hr at 08/08/23 0644 1,400 Units/hr at 08/08/23 0644   ketorolac  (TORADOL ) 15 MG/ML injection 15 mg  15  mg Intramuscular Once Tan, Lorelle Cummins, MD       ketorolac  (TORADOL ) 30 MG/ML injection 30 mg  30 mg Intravenous Q6H PRN Duncan, Hazel V, MD       labetalol  (NORMODYNE ) tablet 100 mg  100 mg Oral BID Duncan, Hazel V, MD   100 mg at 08/08/23 9096   metroNIDAZOLE  (FLAGYL ) IVPB 500 mg  500 mg Intravenous Q12H Duncan, Hazel V, MD       ondansetron  (ZOFRAN ) tablet 4 mg  4 mg Oral Q6H PRN Duncan, Hazel V, MD       Or   ondansetron  (ZOFRAN ) injection 4 mg  4 mg Intravenous Q6H PRN Duncan, Hazel V, MD       oxyCODONE  (Oxy IR/ROXICODONE ) immediate release tablet 5 mg  5 mg Oral Q4H PRN Awanda [place], MD       promethazine  (PHENERGAN ) 12.5 mg in sodium chloride  0.9 % 50 mL IVPB  12.5 mg Intravenous Q6H PRN Cleatus Delayne GAILS, MD         Review of Systems Full ROS  was asked  and was negative except for the information on the HPI  Physical Exam Blood pressure 132/83, pulse 72, temperature 98.3 F (36.8 C), resp. rate 18, height 5' 7 (1.702 m), weight 95.1 kg, last menstrual period 07/24/2023, SpO2 100%. CONSTITUTIONAL: NAD. EYES: Pupils are equal, round, Sclera are non-icteric. EARS, NOSE, MOUTH AND THROAT: The oropharynx is clear. The oral mucosa is pink and moist. Hearing is intact to voice. LYMPH NODES:  Lymph nodes in the neck are normal. RESPIRATORY:  Lungs are clear. There is normal respiratory effort, with equal breath sounds bilaterally, and without pathologic use of accessory muscles. CARDIOVASCULAR: Heart is regular without murmurs, gallops, or rubs. GI: The abdomen is  soft, tender to palpation in the right upper quadrant with positive Murphy sign, and nondistended. There are no palpable masses. There is no hepatosplenomegaly. There are normal bowel sounds in all quadrants. GU: Rectal deferred.   MUSCULOSKELETAL: Normal muscle strength and tone. No cyanosis or edema.   SKIN: Turgor is good and there are no pathologic skin lesions or ulcers. NEUROLOGIC: Motor and sensation is grossly normal. Cranial nerves are grossly intact. PSYCH:  Oriented to person, place and time. Affect is normal.  Data Reviewed I have personally reviewed the patient's imaging, laboratory findings and medical records.    Assessment/Plan 34 year old female with classic signs and symptoms of acute appendicitis she does have history of PE and factor V Leiden and is currently anticoagulated.  I had an extensive discussion with the patient and her primary  provider.  Although she is anticoagulated tomorrow she will be about 36 hours holding the Eliquis .  I do think that this is probably the safest window for us  to do surgery trying to decrease the risk of bleeding.  Another option will be to place a cholecystostomy tube; in my experience this option will delay her care and potentially  increase her morbidity.  She will eventually will have to have an a staged cholecystectomy at some point in time.  After discussion of the patient regarding the risk the benefit and the possible complications she agrees to proceed tomorrow with cholecystectomy.  She is fully aware that she is got a higher risk of bleeding given her anticoagulation.  She also does have an increased risk of thromboembolic events. I do think they will be prudent to hold the heparin  4 to 6 hours before the procedure and then restart back to heparin  drip  after the surgery. There is now definitely good data regarding acute care surgery on patients with anticoagulation and literature  shows that is certainly safe to proceed with cholecystectomy in this circumstances  We will make appropriate arrangements with pharmacy regarding her perioperative anticoagulation management The risks, benefits, complications, treatment options, and expected outcomes were discussed with the patient. The possibilities of bleeding, recurrent infection, finding a normal gallbladder, perforation of viscus organs, damage to surrounding structures, bile leak, abscess formation, needing a drain placed, the need for additional procedures, reaction to medication, pulmonary aspiration,  failure to diagnose a condition, the possible need to convert to an open procedure, and creating a complication requiring transfusion or operation were discussed with the patient. The patient and/or family concurred with the proposed plan, giving informed consent.  I personally spent a total of 75 minutes in the care of the patient today including performing a medically appropriate exam/evaluation, counseling and educating, placing orders, referring and communicating with other health care professionals, documenting clinical information in the EHR, independently interpreting and reviewing images studies and coordinating care.    Jasmine Luna, MD FACS General Surgeon 08/08/2023,  9:34 AM

## 2023-08-08 NOTE — Progress Notes (Signed)
 PHARMACY - ANTICOAGULATION CONSULT NOTE  Pharmacy Consult for Heparin   Indication: pulmonary embolus   Allergies  Allergen Reactions   Penicillins Hives    Patient Measurements: Height: 5' 7 (170.2 cm) Weight: 95.1 kg (209 lb 10.5 oz) IBW/kg (Calculated) : 61.6 HEPARIN  DW (KG): 82.4  Vital Signs: Temp: 98.4 F (36.9 C) (07/04 1949) Temp Source: Oral (07/04 1949) BP: 126/79 (07/04 1949) Pulse Rate: 64 (07/04 1949)  Labs: Recent Labs    08/07/23 2121 08/08/23 1244 08/08/23 2046  HGB 15.4*  --   --   HCT 45.2  --   --   PLT 227  --   --   APTT  --  51* 80*  HEPARINUNFRC  --  0.93*  --   CREATININE 0.72  --   --     Estimated Creatinine Clearance: 120.6 mL/min (by C-G formula based on SCr of 0.72 mg/dL).   Medical History: Past Medical History:  Diagnosis Date   Acid reflux    on no RX at present   Anemia 12/03/2022   Blood transfusion without reported diagnosis 03/03/2022   Depression 03/05/2022   Hemorrhoid 11/2015   starting on past Sat. Pt states it is on the outside   Hypertension    Tobacco abuse     Medications:  Medications Prior to Admission  Medication Sig Dispense Refill Last Dose/Taking   apixaban  (ELIQUIS ) 5 MG TABS tablet TAKE 1 TABLET (5 MG TOTAL) BY MOUTH 2 (TWO) TIMES DAILY. + HETEROZYGOUS FVL 180 tablet 0 08/07/2023 at  8:30 PM   labetalol  (NORMODYNE ) 100 MG tablet Take 1 tablet (100 mg total) by mouth 2 (two) times daily. 180 tablet 3 08/07/2023 at  8:30 PM   triamcinolone  cream (KENALOG ) 0.1 % Apply 1 Application topically 2 (two) times daily. 30 g 0 08/07/2023   ALPRAZolam  (XANAX ) 0.25 MG tablet Take 1 tablet (0.25 mg total) by mouth 2 (two) times daily as needed for anxiety. (Patient not taking: Reported on 08/08/2023) 20 tablet 0 Not Taking   hydrOXYzine  (ATARAX ) 25 MG tablet Take 1 tablet (25 mg total) by mouth every 6 (six) hours as needed for itching. (Patient not taking: Reported on 08/08/2023) 30 tablet 2 Not Taking   venlafaxine  (EFFEXOR )  37.5 MG tablet Take 1 tablet (37.5 mg total) by mouth 2 (two) times daily. (Patient not taking: Reported on 08/08/2023) 180 tablet 1 Not Taking    Assessment: Pharmacy consulted to dose heparin  in this 33 year old female admitted with cholecystitis.  Pt was on Eliquis  5 mg PO BID PTA for PE, last dose on 7/3 @ 1830.  0704 1244 aPTT 51 HL 0.93 0704 2046 aPTT 80  Goal of Therapy:  Heparin  level 0.3-0.7 units/ml aPTT 66 - 102 seconds Monitor platelets by anticoagulation protocol: Yes   Plan:  aPTT is therapeutic x1 Continue heparin  infusion at 1600 units/hr.  Recheck aPTT in 6 hours.  Heparin  level and CBC with AM labs.  Switch to heparin  level monitoring once aPTT and heparin  levels correlate.   Thank you for involving pharmacy in this patient's care.   Damien Napoleon, PharmD Clinical Pharmacist 08/08/2023 9:19 PM

## 2023-08-08 NOTE — H&P (Signed)
 History and Physical    Patient: Jasmine Gray FMW:[id number] DOB: Jun 26, 1990 DOA: 08/07/2023 DOS: the patient was seen and examined on 08/08/2023 PCP: Donzella Lauraine SAILOR, DO  Patient coming from: Home  Chief Complaint:  Chief Complaint  Patient presents with   Abdominal Pain    HPI: Jasmine Gray is a 33 y.o. 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  She presented to the ED with a 4-day history of intermittent right upper quadrant pain associated with nausea and without vomiting.  Denies fever or chills.  Denies change in bowel habits or dysuria. In the ED mildly tachycardic to 101 but otherwise normal vitals.  Labs with normal CBC, lipase and CMP and urinalysis showing moderate leuks.  Pregnancy test negative. Right upper quadrant ultrasound showing cholelithiasis and gallbladder sludge, in the setting of mild gallbladder wall thickening and a positive sonographic Murphy's sign, consistent with acute cholecystitis The ED provider spoke with on-call surgeon, Dr. Desiderio who advises antibiotics and hospitalist admit as patient is on Eliquis .  Advising possibility of percutaneous drain due to being on Eliquis . Admission requested.     Review of Systems: As mentioned in the history of present illness. All other systems reviewed and are negative.  Past Medical History:  Diagnosis Date   Acid reflux    on no RX at present   Anemia 12/03/2022   Blood transfusion without reported diagnosis 03/03/2022   Depression 03/05/2022   Hemorrhoid 11/2015   starting on past Sat. Pt states it is on the outside   Hypertension    Tobacco abuse    Past Surgical History:  Procedure Laterality Date   CESAREAN SECTION  03/03/2022   Procedure: CESAREAN SECTION;  Surgeon: Janit Alm Agent, MD;  Location: [facility] ORS;  Service: Obstetrics;;    Social History:  reports that she has been smoking cigarettes. She has a 5 pack-year smoking history. She has been exposed to tobacco smoke. She has never used smokeless tobacco. She reports that she does not drink alcohol and does not use drugs.  Allergies  Allergen Reactions   Penicillins Hives    Family History  Problem Relation Age of Onset   Hypertension Mother    Migraines Mother    Heart disease Father    Hypertension Father    Congestive Heart Failure Father    Stroke Father    Factor V Leiden deficiency Father    Cancer Maternal Grandmother 54       breast   Alcohol abuse Maternal Grandfather    Hypertension Paternal Grandfather    Depression Neg Hx     Prior to Admission medications   Medication Sig Start Date End Date Taking? Authorizing Provider  ALPRAZolam  (XANAX ) 0.25 MG tablet Take 1 tablet (0.25 mg total) by mouth 2 (two) times daily as needed for anxiety. 11/05/22   Pardue, Lauraine SAILOR, DO  apixaban  (ELIQUIS ) 5 MG TABS tablet TAKE 1 TABLET (5 MG TOTAL) BY MOUTH 2 (TWO) TIMES DAILY. + HETEROZYGOUS FVL 05/30/23   Pardue, Lauraine SAILOR, DO  hydrOXYzine  (ATARAX ) 25 MG tablet Take 1 tablet (25 mg total) by mouth every 6 (six) hours as needed for itching. 04/18/22   Janit Alm Agent, MD  labetalol  (NORMODYNE ) 100 MG tablet Take 1 tablet (100 mg total) by mouth 2 (two) times daily. 11/05/22   Donzella Lauraine SAILOR, DO  triamcinolone  cream (KENALOG ) 0.1 % Apply  1 Application topically 2 (two) times daily. 07/18/23   Vivienne Delon HERO, PA-C  venlafaxine  (EFFEXOR ) 37.5 MG tablet Take 1 tablet (37.5 mg total) by mouth 2 (two) times daily. 12/06/22   Donzella Lauraine SAILOR, DO  albuterol  (VENTOLIN  HFA) 108 (90 Base) MCG/ACT inhaler Inhale 2 puffs into the lungs every 6 (six) hours as needed for wheezing or shortness of breath. 02/14/20 04/22/20  Lavell Bari LABOR, FNP  metoprolol  tartrate (LOPRESSOR ) 25 MG tablet Take 0.5 tablets (12.5 mg total) by mouth 2 (two) times daily as needed  (Palpitations). Patient not taking: Reported on 07/08/2019 03/29/19 04/22/20  Angelena Smalls, MD  omeprazole  (PRILOSEC) 20 MG capsule Take 1 capsule (20 mg total) by mouth daily. 07/17/19 04/22/20  McVey, Almarie Folks, PA-C    Physical Exam: Vitals:   08/07/23 2119 08/07/23 2122  BP:  (!) 123/97  Pulse:  (!) 101  Resp:  18  Temp:  98 F (36.7 C)  TempSrc:  Oral  SpO2:  100%  Weight: 95.3 kg   Height: 5' 7 (1.702 m)    Physical Exam Vitals and nursing note reviewed.  Constitutional:      General: She is not in acute distress. HENT:     Head: Normocephalic and atraumatic.  Cardiovascular:     Rate and Rhythm: Normal rate and regular rhythm.     Heart sounds: Normal heart sounds.  Pulmonary:     Effort: Pulmonary effort is normal.     Breath sounds: Normal breath sounds.  Abdominal:     Palpations: Abdomen is soft.     Tenderness: There is abdominal tenderness in the right upper quadrant.  Neurological:     Mental Status: Mental status is at baseline.     Labs on Admission: I have personally reviewed following labs and imaging studies  CBC: Recent Labs  Lab 08/07/23 2121  WBC 8.1  HGB 15.4*  HCT 45.2  MCV 90.6  PLT 227   Basic Metabolic Panel: Recent Labs  Lab 08/07/23 2121  NA 138  K 3.5  CL 103  CO2 24  GLUCOSE 84  BUN 7  CREATININE 0.72  CALCIUM  9.5   GFR: Estimated Creatinine Clearance: 120.8 mL/min (by C-G formula based on SCr of 0.72 mg/dL). Liver Function Tests: Recent Labs  Lab 08/07/23 2121  AST 21  ALT 21  ALKPHOS 70  BILITOT 0.4  PROT 8.3*  ALBUMIN 4.5   Recent Labs  Lab 08/07/23 2121  LIPASE 24   No results for input(s): AMMONIA  in the last 168 hours. Coagulation Profile: No results for input(s): INR, PROTIME in the last 168 hours. Cardiac Enzymes: No results for input(s): CKTOTAL, CKMB, CKMBINDEX, TROPONINI in the last 168 hours. BNP (last 3 results) No results for input(s): PROBNP in the last 8760  hours. HbA1C: No results for input(s): HGBA1C in the last 72 hours. CBG: No results for input(s): GLUCAP in the last 168 hours. Lipid Profile: No results for input(s): CHOL, HDL, LDLCALC, TRIG, CHOLHDL, LDLDIRECT in the last 72 hours. Thyroid Function Tests: No results for input(s): TSH, T4TOTAL, FREET4, T3FREE, THYROIDAB in the last 72 hours. Anemia Panel: No results for input(s): VITAMINB12, FOLATE, FERRITIN, TIBC, IRON , RETICCTPCT in the last 72 hours. Urine analysis:    Component Value Date/Time   COLORURINE STRAW (A) 08/07/2023 2121   APPEARANCEUR CLEAR (A) 08/07/2023 2121   APPEARANCEUR Clear 09/03/2021 1153   LABSPEC 1.004 (L) 08/07/2023 2121   LABSPEC 1.001 11/17/2013 2051   PHURINE 6.0 08/07/2023 2121  GLUCOSEU NEGATIVE 08/07/2023 2121   GLUCOSEU Negative 11/17/2013 2051   HGBUR SMALL (A) 08/07/2023 2121   BILIRUBINUR NEGATIVE 08/07/2023 2121   BILIRUBINUR neg 04/18/2022 1140   BILIRUBINUR Negative 09/03/2021 1153   BILIRUBINUR Negative 11/17/2013 2051   KETONESUR NEGATIVE 08/07/2023 2121   PROTEINUR NEGATIVE 08/07/2023 2121   UROBILINOGEN 0.2 04/18/2022 1140   NITRITE NEGATIVE 08/07/2023 2121   LEUKOCYTESUR MODERATE (A) 08/07/2023 2121   LEUKOCYTESUR Negative 11/17/2013 2051    Radiological Exams on Admission: US  ABDOMEN LIMITED RUQ (LIVER/GB) Result Date: 08/08/2023 CLINICAL DATA:  Right upper quadrant pain x2 weeks. EXAM: ULTRASOUND ABDOMEN LIMITED RIGHT UPPER QUADRANT COMPARISON:  July 30, 2021 FINDINGS: Gallbladder: Echogenic sludge and numerous gallstones are present within the gallbladder lumen (the largest measures 1.7 cm). An additional gallstone is seen within the neck of the gallbladder. The gallbladder wall measures 4.1 mm in thickness. A positive sonographic Beverley sign is noted by the sonographer. Common bile duct: Diameter: 2.4 mm Liver: No focal lesion identified. Within normal limits in parenchymal echogenicity.  Portal vein is patent on color Doppler imaging with normal direction of blood flow towards the liver. Other: None. IMPRESSION: Cholelithiasis and gallbladder sludge, in the setting of mild gallbladder wall thickening and a positive sonographic Murphy's sign, consistent with acute cholecystitis. Electronically Signed   By: Suzen Dials M.D.   On: 08/08/2023 00:00   Data Reviewed for HPI: Relevant notes from primary care and specialist visits, past discharge summaries as available in EHR, including Care Everywhere. Prior diagnostic testing as pertinent to current admission diagnoses Updated medications and problem lists for reconciliation ED course, including vitals, labs, imaging, treatment and response to treatment Triage notes, nursing and pharmacy notes and ED provider's notes Notable results as noted above in HPI      Assessment and Plan: * Acute cholecystitis N.p.o. IV fluids Pain meds, antiemetics Continue Flagyl  and Rocephin  ordered by ED Heparin  bridge and hold Eliquis  IR consulted for possible percutaneous cholecystostomy Surgery consulted from the ED-Dr. Desiderio Formal surgical consult placed  Chronic anticoagulation Heterozygous factor V Leiden  History of DVT PE Heparin  bridge in anticipation of procedure/surgery Holding Eliquis -last dose was on 7/3 PM dose  Essential hypertension Continue home labetalol   Anxiety and depression Continue as needed alprazolam     DVT prophylaxis: On heparin  bridge  Consults: Surgery, Dr. Desiderio, IR  Advance Care Planning:   Code Status: Prior   Family Communication: none  Disposition Plan: Back to previous home environment  Severity of Illness: The appropriate patient status for this patient is OBSERVATION. Observation status is judged to be reasonable and necessary in order to provide the required intensity of service to ensure the patient's safety. The patient's presenting symptoms, physical exam findings, and initial  radiographic and laboratory data in the context of their medical condition is felt to place them at decreased risk for further clinical deterioration. Furthermore, it is anticipated that the patient will be medically stable for discharge from the hospital within 2 midnights of admission.   Author: Delayne LULLA Solian, MD 08/08/2023 12:44 AM  For on call review [email or web address].

## 2023-08-08 NOTE — Assessment & Plan Note (Addendum)
 Continue as needed alprazolam

## 2023-08-08 NOTE — Hospital Course (Signed)
 SABRA

## 2023-08-08 NOTE — TOC Progression Note (Signed)
 Transition of Care [facility]) - Progression Note    Patient Details  Name: Jasmine Gray MRN: [id number] Date of Birth: [birth year]  [facility] CM/SW Contact  [place] Jaidy [place], [place] Phone Number: 08/08/2023, 3:14 PM  Clinical Narrative:     Chart reviewed. No current TOC needs.        Expected Discharge Plan and Services                                               Social Determinants of Health (SDOH) Interventions SDOH Screenings   Food Insecurity: No Food Insecurity (08/08/2023)  Housing: Unknown (08/08/2023)  Transportation Needs: No Transportation Needs (08/08/2023)  Utilities: Not At Risk (08/08/2023)  Alcohol Screen: Low Risk  (11/04/2022)  Depression (PHQ2-9): Medium Risk (12/06/2022)  Financial Resource Strain: Medium Risk (12/06/2022)  Physical Activity: Inactive (12/06/2022)  Social Connections: Moderately Isolated (08/08/2023)  Stress: Stress Concern Present (12/06/2022)  Tobacco Use: High Risk (08/07/2023)    Readmission Risk Interventions     No data to display

## 2023-08-09 ENCOUNTER — Encounter: Payer: Self-pay | Admitting: Anesthesiology

## 2023-08-09 ENCOUNTER — Encounter
Admission: EM | Disposition: A | Discharge status: Home / Self Care | Payer: Self-pay | Source: Home / Self Care | Attending: Hospitalist

## 2023-08-09 ENCOUNTER — Other Ambulatory Visit: Payer: Self-pay

## 2023-08-09 DIAGNOSIS — K810 Acute cholecystitis: Secondary | ICD-10-CM | POA: Diagnosis present

## 2023-08-09 DIAGNOSIS — F419 Anxiety disorder, unspecified: Secondary | ICD-10-CM | POA: Diagnosis not present

## 2023-08-09 DIAGNOSIS — Z86711 Personal history of pulmonary embolism: Secondary | ICD-10-CM | POA: Diagnosis not present

## 2023-08-09 DIAGNOSIS — F32A Depression, unspecified: Secondary | ICD-10-CM | POA: Diagnosis not present

## 2023-08-09 DIAGNOSIS — Z820 Family history of epilepsy and other diseases of the nervous system: Secondary | ICD-10-CM | POA: Diagnosis not present

## 2023-08-09 DIAGNOSIS — Z880 Allergy status to penicillin: Secondary | ICD-10-CM | POA: Diagnosis not present

## 2023-08-09 DIAGNOSIS — Z79899 Other long term (current) drug therapy: Secondary | ICD-10-CM | POA: Diagnosis not present

## 2023-08-09 DIAGNOSIS — I10 Essential (primary) hypertension: Secondary | ICD-10-CM | POA: Diagnosis not present

## 2023-08-09 DIAGNOSIS — Z823 Family history of stroke: Secondary | ICD-10-CM | POA: Diagnosis not present

## 2023-08-09 DIAGNOSIS — Z7901 Long term (current) use of anticoagulants: Secondary | ICD-10-CM | POA: Diagnosis not present

## 2023-08-09 DIAGNOSIS — Z86718 Personal history of other venous thrombosis and embolism: Secondary | ICD-10-CM | POA: Diagnosis not present

## 2023-08-09 DIAGNOSIS — D6851 Activated protein C resistance: Secondary | ICD-10-CM | POA: Diagnosis not present

## 2023-08-09 DIAGNOSIS — K8000 Calculus of gallbladder with acute cholecystitis without obstruction: Secondary | ICD-10-CM | POA: Diagnosis not present

## 2023-08-09 DIAGNOSIS — E876 Hypokalemia: Secondary | ICD-10-CM | POA: Diagnosis not present

## 2023-08-09 DIAGNOSIS — Z832 Family history of diseases of the blood and blood-forming organs and certain disorders involving the immune mechanism: Secondary | ICD-10-CM | POA: Diagnosis not present

## 2023-08-09 DIAGNOSIS — F17210 Nicotine dependence, cigarettes, uncomplicated: Secondary | ICD-10-CM | POA: Diagnosis not present

## 2023-08-09 DIAGNOSIS — R21 Rash and other nonspecific skin eruption: Secondary | ICD-10-CM | POA: Diagnosis not present

## 2023-08-09 DIAGNOSIS — Z803 Family history of malignant neoplasm of breast: Secondary | ICD-10-CM | POA: Diagnosis not present

## 2023-08-09 DIAGNOSIS — Z811 Family history of alcohol abuse and dependence: Secondary | ICD-10-CM | POA: Diagnosis not present

## 2023-08-09 DIAGNOSIS — R1011 Right upper quadrant pain: Principal | ICD-10-CM

## 2023-08-09 DIAGNOSIS — K819 Cholecystitis, unspecified: Secondary | ICD-10-CM | POA: Diagnosis not present

## 2023-08-09 DIAGNOSIS — Z8249 Family history of ischemic heart disease and other diseases of the circulatory system: Secondary | ICD-10-CM | POA: Diagnosis not present

## 2023-08-09 LAB — COMPREHENSIVE METABOLIC PANEL WITH GFR
ALT: 23 U/L (ref 0–44)
AST: 24 U/L (ref 15–41)
Albumin: 3.9 g/dL (ref 3.5–5.0)
Alkaline Phosphatase: 65 U/L (ref 38–126)
Anion gap: 9 (ref 5–15)
BUN: 6 mg/dL (ref 6–20)
CO2: 22 mmol/L (ref 22–32)
Calcium: 8.8 mg/dL — ABNORMAL LOW (ref 8.9–10.3)
Chloride: 107 mmol/L (ref 98–111)
Creatinine, Ser: 0.72 mg/dL (ref 0.44–1.00)
GFR, Estimated: >60 mL/min (ref >60)
Glucose, Bld: 90 mg/dL (ref 70–99)
Potassium: 3.4 mmol/L — ABNORMAL LOW (ref 3.5–5.1)
Sodium: 138 mmol/L (ref 135–145)
Total Bilirubin: <0.2 mg/dL (ref 0.0–1.2)
Total Protein: 7.2 g/dL (ref 6.5–8.1)

## 2023-08-09 LAB — MAGNESIUM: Magnesium: 2.3 mg/dL (ref 1.7–2.4)

## 2023-08-09 LAB — CBC
HCT: 40.7 % (ref 36.0–46.0)
Hemoglobin: 14 g/dL (ref 12.0–15.0)
MCH: 30.8 pg (ref 26.0–34.0)
MCHC: 34.4 g/dL (ref 30.0–36.0)
MCV: 89.5 fL (ref 80.0–100.0)
Platelets: 190 K/uL (ref 150–400)
RBC: 4.55 MIL/uL (ref 3.87–5.11)
RDW: 12.8 % (ref 11.5–15.5)
WBC: 5.7 K/uL (ref 4.0–10.5)
nRBC: 0 % (ref 0.0–0.2)

## 2023-08-09 LAB — HEPARIN LEVEL (UNFRACTIONATED): Heparin Unfractionated: 0.27 [IU]/mL — ABNORMAL LOW (ref 0.30–0.70)

## 2023-08-09 SURGERY — CHOLECYSTECTOMY, ROBOT-ASSISTED, LAPAROSCOPIC
Anesthesia: General

## 2023-08-09 MED ORDER — 0.9 % SODIUM CHLORIDE (POUR BTL) OPTIME
TOPICAL | Status: DC | PRN
  Administered 2023-08-09: 500 mL

## 2023-08-09 MED ORDER — LIDOCAINE HCL (CARDIAC) PF 100 MG/5ML IV SOSY
PREFILLED_SYRINGE | INTRAVENOUS | Status: DC | PRN
  Administered 2023-08-09: 100 mg via INTRAVENOUS

## 2023-08-09 MED ORDER — PROPOFOL 10 MG/ML IV BOLUS
INTRAVENOUS | Status: AC
  Filled 2023-08-09: qty 20

## 2023-08-09 MED ORDER — ACETAMINOPHEN 500 MG PO TABS
1000.0000 mg | ORAL_TABLET | Freq: Four times a day (QID) | ORAL | Status: DC
  Administered 2023-08-09 – 2023-08-10 (×3): 1000 mg via ORAL
  Filled 2023-08-09 (×3): qty 2

## 2023-08-09 MED ORDER — HEPARIN (PORCINE) 25000 UT/250ML-% IV SOLN
1600.0000 [IU]/h | INTRAVENOUS | Status: DC
  Administered 2023-08-09: 1600 [IU]/h via INTRAVENOUS
  Filled 2023-08-09 (×2): qty 250

## 2023-08-09 MED ORDER — SUGAMMADEX SODIUM 200 MG/2ML IV SOLN
INTRAVENOUS | Status: DC | PRN
  Administered 2023-08-09: 380.4 mg via INTRAVENOUS

## 2023-08-09 MED ORDER — ROCURONIUM BROMIDE 100 MG/10ML IV SOLN
INTRAVENOUS | Status: DC | PRN
  Administered 2023-08-09: 60 mg via INTRAVENOUS
  Administered 2023-08-09: 20 mg via INTRAVENOUS

## 2023-08-09 MED ORDER — PROPOFOL 10 MG/ML IV BOLUS
INTRAVENOUS | Status: DC | PRN
  Administered 2023-08-09: 200 mg via INTRAVENOUS

## 2023-08-09 MED ORDER — KETOROLAC TROMETHAMINE 30 MG/ML IJ SOLN
30.0000 mg | Freq: Four times a day (QID) | INTRAMUSCULAR | Status: DC
  Administered 2023-08-09 – 2023-08-10 (×5): 30 mg via INTRAVENOUS
  Filled 2023-08-09 (×5): qty 1

## 2023-08-09 MED ORDER — CEFAZOLIN SODIUM-DEXTROSE 2-4 GM/100ML-% IV SOLN
INTRAVENOUS | Status: AC
  Filled 2023-08-09: qty 100

## 2023-08-09 MED ORDER — MORPHINE SULFATE (PF) 2 MG/ML IV SOLN: 2.0000 mg | INTRAVENOUS | Status: DC | PRN

## 2023-08-09 MED ORDER — ONDANSETRON HCL 4 MG/2ML IJ SOLN
INTRAMUSCULAR | Status: DC | PRN
  Administered 2023-08-09: 4 mg via INTRAVENOUS

## 2023-08-09 MED ORDER — OXYCODONE HCL 5 MG PO TABS
5.0000 mg | ORAL_TABLET | ORAL | Status: DC | PRN
  Administered 2023-08-09: 10 mg via ORAL
  Filled 2023-08-09: qty 2

## 2023-08-09 MED ORDER — DEXAMETHASONE SODIUM PHOSPHATE 10 MG/ML IJ SOLN
INTRAMUSCULAR | Status: DC | PRN
  Administered 2023-08-09: 10 mg via INTRAVENOUS

## 2023-08-09 MED ORDER — MIDAZOLAM HCL 2 MG/2ML IJ SOLN
INTRAMUSCULAR | Status: AC
Start: 2023-08-09 — End: 2023-08-09
  Filled 2023-08-09: qty 2

## 2023-08-09 MED ORDER — FENTANYL CITRATE (PF) 100 MCG/2ML IJ SOLN
INTRAMUSCULAR | Status: AC
  Filled 2023-08-09: qty 2

## 2023-08-09 MED ORDER — INDOCYANINE GREEN 25 MG IV SOLR
INTRAVENOUS | Status: AC
  Filled 2023-08-09: qty 10

## 2023-08-09 MED ORDER — HYDROMORPHONE HCL 1 MG/ML IJ SOLN
INTRAMUSCULAR | Status: AC
Start: 2023-08-09 — End: 2023-08-09
  Filled 2023-08-09: qty 1

## 2023-08-09 MED ORDER — DEXMEDETOMIDINE HCL IN NACL 80 MCG/20ML IV SOLN
INTRAVENOUS | Status: DC | PRN
Start: 2023-08-09 — End: 2023-08-09
  Administered 2023-08-09: 8 ug via INTRAVENOUS
  Administered 2023-08-09: 12 ug via INTRAVENOUS

## 2023-08-09 MED ORDER — HYDROMORPHONE HCL 1 MG/ML IJ SOLN
0.2500 mg | INTRAMUSCULAR | Status: DC | PRN
  Administered 2023-08-09 (×2): 0.5 mg via INTRAVENOUS

## 2023-08-09 MED ORDER — LACTATED RINGERS IV SOLN: INTRAVENOUS | Status: DC | PRN

## 2023-08-09 MED ORDER — CEFAZOLIN SODIUM-DEXTROSE 2-3 GM-%(50ML) IV SOLR
INTRAVENOUS | Status: DC | PRN
  Administered 2023-08-09: 2 g via INTRAVENOUS

## 2023-08-09 MED ORDER — MIDAZOLAM HCL 2 MG/2ML IJ SOLN
INTRAMUSCULAR | Status: DC | PRN
  Administered 2023-08-09: 2 mg via INTRAVENOUS

## 2023-08-09 MED ORDER — OXYCODONE HCL 5 MG PO TABS: 5.0000 mg | ORAL_TABLET | Freq: Once | ORAL | Status: DC | PRN

## 2023-08-09 MED ORDER — FENTANYL CITRATE (PF) 100 MCG/2ML IJ SOLN
INTRAMUSCULAR | Status: DC | PRN
  Administered 2023-08-09 (×2): 50 ug via INTRAVENOUS

## 2023-08-09 MED ORDER — BUPIVACAINE-EPINEPHRINE 0.25% -1:200000 IJ SOLN
INTRAMUSCULAR | Status: DC | PRN
  Administered 2023-08-09: 50 mL

## 2023-08-09 MED ORDER — OXYCODONE HCL 5 MG/5ML PO SOLN: 5.0000 mg | Freq: Once | ORAL | Status: DC | PRN

## 2023-08-09 MED ORDER — ACETAMINOPHEN 10 MG/ML IV SOLN
INTRAVENOUS | Status: DC | PRN
  Administered 2023-08-09: 1000 mg via INTRAVENOUS

## 2023-08-09 MED ORDER — POTASSIUM CHLORIDE CRYS ER 20 MEQ PO TBCR
40.0000 meq | EXTENDED_RELEASE_TABLET | Freq: Once | ORAL | Status: AC
  Administered 2023-08-09: 40 meq via ORAL
  Filled 2023-08-09: qty 2

## 2023-08-09 MED ORDER — DEXMEDETOMIDINE HCL IN NACL 80 MCG/20ML IV SOLN
INTRAVENOUS | Status: AC
  Filled 2023-08-09: qty 20

## 2023-08-09 SURGICAL SUPPLY — 37 items
CANNULA REDUCER 12-8 DVNC XI (CANNULA) ×1 IMPLANT
CATH REDDICK CHOLANGI 4FR 50CM (CATHETERS) IMPLANT
CAUTERY HOOK MNPLR 1.6 DVNC XI (INSTRUMENTS) ×1 IMPLANT
CLIP LIGATING HEMO O LOK GREEN (MISCELLANEOUS) ×1 IMPLANT
DERMABOND ADVANCED .7 DNX12 (GAUZE/BANDAGES/DRESSINGS) ×1 IMPLANT
DRAPE ARM DVNC X/XI (DISPOSABLE) ×4 IMPLANT
DRAPE COLUMN DVNC XI (DISPOSABLE) ×1 IMPLANT
ELECTRODE REM PT RTRN 9FT ADLT (ELECTROSURGICAL) ×1 IMPLANT
FORCEPS BPLR R/ABLATION 8 DVNC (INSTRUMENTS) ×1 IMPLANT
FORCEPS PROGRASP DVNC XI (FORCEP) ×1 IMPLANT
GLOVE BIO SURGEON STRL SZ7 (GLOVE) ×2 IMPLANT
GOWN STRL REUS W/ TWL LRG LVL3 (GOWN DISPOSABLE) ×4 IMPLANT
IRRIGATION STRYKERFLOW (MISCELLANEOUS) IMPLANT
IV CATH ANGIO 12GX3 LT BLUE (NEEDLE) IMPLANT
KIT PINK PAD W/HEAD ARM REST (MISCELLANEOUS) ×1 IMPLANT
LABEL OR SOLS (LABEL) ×1 IMPLANT
MANIFOLD NEPTUNE II (INSTRUMENTS) ×1 IMPLANT
NDL HYPO 22X1.5 SAFETY MO (MISCELLANEOUS) ×1 IMPLANT
NEEDLE HYPO 22X1.5 SAFETY MO (MISCELLANEOUS) ×1 IMPLANT
NS IRRIG 500ML POUR BTL (IV SOLUTION) ×1 IMPLANT
OBTURATOR OPTICALSTD 8 DVNC (TROCAR) ×1 IMPLANT
PACK LAP CHOLECYSTECTOMY (MISCELLANEOUS) ×1 IMPLANT
SEAL UNIV 5-12 XI (MISCELLANEOUS) ×4 IMPLANT
SET TUBE SMOKE EVAC HIGH FLOW (TUBING) ×1 IMPLANT
SOL .9 NS 3000ML IRR UROMATIC (IV SOLUTION) IMPLANT
SOLUTION ELECTROSURG ANTI STCK (MISCELLANEOUS) ×1 IMPLANT
SPIKE FLUID TRANSFER (MISCELLANEOUS) ×1 IMPLANT
SPONGE T-LAP 18X18 ~~LOC~~+RFID (SPONGE) ×1 IMPLANT
SPONGE T-LAP 4X18 ~~LOC~~+RFID (SPONGE) IMPLANT
STOPCOCK 3WAY MALE LL (IV SETS) IMPLANT
SUT MNCRL AB 4-0 PS2 18 (SUTURE) ×1 IMPLANT
SUT VICRYL 0 UR6 27IN ABS (SUTURE) ×2 IMPLANT
SYR 20ML LL LF (SYRINGE) IMPLANT
SYSTEM BAG RETRIEVAL 10MM (BASKET) ×1 IMPLANT
TRAP FLUID SMOKE EVACUATOR (MISCELLANEOUS) ×1 IMPLANT
WATER STERILE IRR 3000ML UROMA (IV SOLUTION) IMPLANT
WATER STERILE IRR 500ML POUR (IV SOLUTION) ×1 IMPLANT

## 2023-08-09 NOTE — Anesthesia Preprocedure Evaluation (Addendum)
 Anesthesia Evaluation  Patient identified by MRN, date of birth, ID band Patient awake    Reviewed: Allergy & Precautions, NPO status , Patient's Chart, lab work & pertinent test results  History of Anesthesia Complications Negative for: history of anesthetic complications  Airway Mallampati: III  TM Distance: >3 FB Neck ROM: full    Dental  (+) Poor Dentition   Pulmonary Current Smoker   Pulmonary exam normal        Cardiovascular hypertension, On Medications Normal cardiovascular exam     Neuro/Psych  PSYCHIATRIC DISORDERS Anxiety Depression    negative neurological ROS     GI/Hepatic Neg liver ROS,GERD  Medicated,,  Endo/Other  negative endocrine ROS    Renal/GU      Musculoskeletal   Abdominal   Peds  Hematology  (+) Blood dyscrasia, anemia  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 Pt states it is on the outside No date: Hypertension No date: Tobacco abuse  Past Surgical History: 03/03/2022: CESAREAN SECTION     Comment:  Procedure: CESAREAN SECTION;  Surgeon: Janit Alm Agent, MD;  Location: [facility] ORS;  Service: Obstetrics;;  BMI    Body Mass Index: 32.84 kg/m      Reproductive/Obstetrics negative OB ROS                              Anesthesia Physical Anesthesia Plan  ASA: 3  Anesthesia Plan: General ETT   Post-op Pain Management: Toradol  IV (intra-op)*, Ofirmev  IV (intra-op)*, Dilaudid  IV and Ketamine IV*   Induction: Intravenous  PONV Risk Score and Plan: 3 and Ondansetron , Dexamethasone , Midazolam  and Treatment may vary due to age or medical condition  Airway Management Planned: Oral  ETT  Additional Equipment:   Intra-op Plan:   Post-operative Plan: Extubation in OR  Informed Consent: I have reviewed the patients History and Physical, chart, labs and discussed the procedure including the risks, benefits and alternatives for the proposed anesthesia with the patient or authorized representative who has indicated his/her understanding and acceptance.     Dental Advisory Given  Plan Discussed with: Anesthesiologist, CRNA and Surgeon  Anesthesia Plan Comments: (Patient consented for risks of anesthesia including but not limited to:  - adverse reactions to medications - damage to eyes, teeth, lips or other oral mucosa - nerve damage due to positioning  - sore throat or hoarseness - Damage to heart, brain, nerves, lungs, other parts of body or loss of life  Patient voiced understanding and assent.)         Anesthesia Quick Evaluation

## 2023-08-09 NOTE — Transfer of Care (Signed)
 Immediate Anesthesia Transfer of Care Note  Patient: Jasmine Gray  Procedure(s) Performed: Procedure(s): CHOLECYSTECTOMY, ROBOT-ASSISTED, LAPAROSCOPIC (N/A) INDOCYANINE GREEN  FLUORESCENCE IMAGING (ICG) (N/A)  Patient Location: PACU  Anesthesia Type:General  Level of Consciousness: sedated  Airway & Oxygen Therapy: Patient Spontanous Breathing and Patient connected to face mask oxygen  Post-op Assessment: Report given to RN and Post -op Vital signs reviewed and stable  Post vital signs: Reviewed and stable  Last Vitals:  Vitals:   08/09/23 0732 08/09/23 0959  BP: 131/81 120/65  Pulse: 61 79  Resp: 16 15  Temp: 36.9 C   SpO2: 100% 99%    Complications: No apparent anesthesia complications

## 2023-08-09 NOTE — Progress Notes (Signed)
  PROGRESS NOTE    BEAUTY PLESS  FMW:[id number] DOB: May 03, 1990 DOA: 08/07/2023 PCP: Donzella Lauraine SAILOR, DO  209A/209A-AA  LOS: 0 days   Brief hospital course:   Assessment & Plan: Jasmine Gray is a 33 y.o. 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  She presented to the ED with a 4-day history of intermittent right upper quadrant pain associated with nausea and without vomiting.    Right upper quadrant ultrasound showing cholelithiasis and gallbladder sludge, in the setting of mild gallbladder wall thickening and a positive sonographic Murphy's sign, consistent with acute cholecystitis   * Acute Cholecystitis --cholecystectomy today --cont empiric ceftriaxone  and flagyl   Chronic anticoagulation Heterozygous factor V Leiden  History of DVT PE --hold home Eliquis  --resume heparin  gtt tonight.  Ensure no bleeding complication before resuming home eliquis .  Essential hypertension Continue home labetalol   Anxiety and depression Continue as needed alprazolam   Rash over left forearm --triamcinolone  cream  Hypokalemia --supplement PRN   DVT prophylaxis: On:heparin  gtt Code Status: Full code  Family Communication:  Level of care: Med-Surg Dispo:   The patient is from: home Anticipated d/c is to: home Anticipated d/c date is: likely tomorrow   Subjective and Interval History:  Pt underwent cholecystectomy today.  Tolerated it well.  Reported feeling better afterwards.   Objective: Vitals:   08/09/23 1015 08/09/23 1030 08/09/23 1035 08/09/23 1117  BP: 138/75 133/76  130/82  Pulse: 64 79 70 (!) 58  Resp: 13 12 19 16   Temp:   98.2 F (36.8 C) 98.1 F (36.7 C)  TempSrc:    Oral  SpO2: 99% 95% 97% 100%  Weight:      Height:        Intake/Output Summary (Last 24 hours) at 08/09/2023 1356 Last data filed at  08/09/2023 0924 Gross per 24 hour  Intake 1002.9 ml  Output --  Net 1002.9 ml   Filed Weights   08/07/23 2119 08/08/23 0121  Weight: 95.3 kg 95.1 kg    Examination:   Constitutional: NAD, AAOx3 HEENT: conjunctivae and lids normal, EOMI CV: No cyanosis.   RESP: normal respiratory effort, on RA Neuro: II - XII grossly intact.   Psych: Normal mood and affect.  Appropriate judgement and reason   Data Reviewed: I have personally reviewed labs and imaging studies   Ellouise Haber, MD [facility] If 7PM-7AM, please contact night-coverage 08/09/2023, 1:56 PM

## 2023-08-09 NOTE — Plan of Care (Signed)

## 2023-08-09 NOTE — Op Note (Signed)
 Robotic assisted laparoscopic Cholecystectomy  Pre-operative Diagnosis: Acute cholecystitis  Post-operative Diagnosis: same  Procedure:  Robotic assisted laparoscopic Cholecystectomy  Surgeon: Laneta Luna, MD FACS  Anesthesia: Gen. with endotracheal tube  Findings: Moderate to severe acute calculus Cholecystitis Hydrops   Estimated Blood Loss: 5cc       Specimens: Gallbladder           Complications: none   Procedure Details  The patient was seen again in the Holding Room. The benefits, complications, treatment options, and expected outcomes were discussed with the patient. The risks of bleeding, infection, recurrence of symptoms, failure to resolve symptoms, bile duct damage, bile duct leak, retained common bile duct stone, bowel injury, any of which could require further surgery and/or ERCP, stent, or papillotomy were reviewed with the patient. The likelihood of improving the patient's symptoms with return to their baseline status is good.  The patient and/or family concurred with the proposed plan, giving informed consent.  The patient was taken to Operating Room, identified  and the procedure verified as Laparoscopic Cholecystectomy.  A Time Out was held and the above information confirmed.  Prior to the induction of general anesthesia, antibiotic prophylaxis was administered. VTE prophylaxis was in place. General endotracheal anesthesia was then administered and tolerated well. After the induction, the abdomen was prepped with Chloraprep and draped in the sterile fashion. The patient was positioned in the supine position.  Cut down technique was used to enter the abdominal cavity and a Hasson trochar was placed after two vicryl stitches were anchored to the fascia. Pneumoperitoneum was then created with CO2 and tolerated well without any adverse changes in the patient's vital signs.  Three 8-mm ports were placed under direct vision. All skin incisions  were infiltrated with a local  anesthetic agent before making the incision and placing the trocars.   The patient was positioned  in reverse Trendelenburg, robot was brought to the surgical field and docked in the standard fashion.  We made sure all the instrumentation was kept indirect view at all times and that there were no collision between the arms. I scrubbed out and went to the console.  The gallbladder was identified, it was distended and with significant inflammatory response. The fundus grasped and retracted cephalad. Adhesions were lysed bluntly. The infundibulum was grasped and retracted laterally, exposing the peritoneum overlying the triangle of Calot. This was then divided and exposed in a blunt fashion. An extended critical view of the cystic duct and cystic artery was obtained.  The cystic duct was clearly identified and bluntly dissected.   Artery and duct were double clipped and divided. Using ICG cholangiography we visualize the cystic duct and CBD w/o evidence of bile injuries. The gallbladder was taken from the gallbladder fossa in a retrograde fashion with the electrocautery. The wall was so thin and fragile that upon using cautery to remove the GB there was spillage of thick yellow viscous fluid c/w hydrops. It was aspirated and RUQ irrigated profusely. Hemostasis was achieved with the electrocautery. nspection of the right upper quadrant was performed. No bleeding, bile duct injury or leak, or bowel injury was noted. Robotic instruments and robotic arms were undocked in the standard fashion.  I scrubbed back in.  The gallbladder was removed and placed in an Endocatch bag.   Pneumoperitoneum was released.  The periumbilical port site was closed with interrumpted 0 Vicryl sutures. 4-0 subcuticular Monocryl was used to close the skin. Dermabond was  applied.  The patient was then extubated  and brought to the recovery room in stable condition. Sponge, lap, and needle counts were correct at closure and at the  conclusion of the case.               Laneta Luna, MD, FACS

## 2023-08-09 NOTE — Anesthesia Procedure Notes (Signed)
 Procedure Name: Intubation Date/Time: 08/09/2023 8:31 AM  Performed by: Tod Handing, CRNAPre-anesthesia Checklist: Patient identified, Patient being monitored, Timeout performed, Emergency Drugs available and Suction available Patient Re-evaluated:Patient Re-evaluated prior to induction Oxygen Delivery Method: Circle system utilized Preoxygenation: Pre-oxygenation with 100% oxygen Induction Type: IV induction Ventilation: Mask ventilation without difficulty Laryngoscope Size: Mac and 3 Grade View: Grade I Tube type: Oral Tube size: 7.0 mm Number of attempts: 1 Airway Equipment and Method: Stylet Placement Confirmation: ETT inserted through vocal cords under direct vision, positive ETCO2 and breath sounds checked- equal and bilateral Secured at: 21 cm Tube secured with: Tape Dental Injury: Teeth and Oropharynx as per pre-operative assessment

## 2023-08-09 NOTE — Progress Notes (Signed)
 Preoperative Review   Patient is met in the preoperative holding area. The history is reviewed in the chart and with the patient. I personally reviewed the options and rationale as well as the risks of this procedure that have been previously discussed with the patient. All questions asked by the patient and/or family were answered to their satisfaction.  Patient agrees to proceed with this procedure at this time.  Sterling Big M.D. FACS

## 2023-08-09 NOTE — Progress Notes (Signed)
 PHARMACY - ANTICOAGULATION CONSULT NOTE  Pharmacy Consult for Heparin   Indication: pulmonary embolus   Allergies  Allergen Reactions   Penicillins Hives    Patient Measurements: Height: 5' 7 (170.2 cm) Weight: 95.1 kg (209 lb 10.5 oz) IBW/kg (Calculated) : 61.6 HEPARIN  DW (KG): 82.4  Vital Signs: Temp: 98.1 F (36.7 C) (07/05 0324) Temp Source: Oral (07/04 1949) BP: 117/72 (07/05 0324) Pulse Rate: 76 (07/05 0324)  Labs: Recent Labs    08/07/23 2121 08/08/23 1244 08/08/23 2046 08/09/23 0342  HGB 15.4*  --   --  14.0  HCT 45.2  --   --  40.7  PLT 227  --   --  190  APTT  --  51* 80*  --   HEPARINUNFRC  --  0.93*  --   --   CREATININE 0.72  --   --   --     Estimated Creatinine Clearance: 120.6 mL/min (by C-G formula based on SCr of 0.72 mg/dL).   Medical History: Past Medical History:  Diagnosis Date   Acid reflux    on no RX at present   Anemia 12/03/2022   Blood transfusion without reported diagnosis 03/03/2022   Depression 03/05/2022   Hemorrhoid 11/2015   starting on past Sat. Pt states it is on the outside   Hypertension    Tobacco abuse     Medications:  Medications Prior to Admission  Medication Sig Dispense Refill Last Dose/Taking   apixaban  (ELIQUIS ) 5 MG TABS tablet TAKE 1 TABLET (5 MG TOTAL) BY MOUTH 2 (TWO) TIMES DAILY. + HETEROZYGOUS FVL 180 tablet 0 08/07/2023 at  8:30 PM   labetalol  (NORMODYNE ) 100 MG tablet Take 1 tablet (100 mg total) by mouth 2 (two) times daily. 180 tablet 3 08/07/2023 at  8:30 PM   triamcinolone  cream (KENALOG ) 0.1 % Apply 1 Application topically 2 (two) times daily. 30 g 0 08/07/2023   ALPRAZolam  (XANAX ) 0.25 MG tablet Take 1 tablet (0.25 mg total) by mouth 2 (two) times daily as needed for anxiety. (Patient not taking: Reported on 08/08/2023) 20 tablet 0 Not Taking   hydrOXYzine  (ATARAX ) 25 MG tablet Take 1 tablet (25 mg total) by mouth every 6 (six) hours as needed for itching. (Patient not taking: Reported on 08/08/2023) 30  tablet 2 Not Taking   venlafaxine  (EFFEXOR ) 37.5 MG tablet Take 1 tablet (37.5 mg total) by mouth 2 (two) times daily. (Patient not taking: Reported on 08/08/2023) 180 tablet 1 Not Taking    Assessment: Pharmacy consulted to dose heparin  in this 33 year old female admitted with cholecystitis.  Pt was on Eliquis  5 mg PO BID PTA for PE, last dose on 7/3 @ 1830.  0704 1244 aPTT 51 HL 0.93 0704 2046 aPTT 80  Goal of Therapy:  Heparin  level 0.3-0.7 units/ml aPTT 66 - 102 seconds Monitor platelets by anticoagulation protocol: Yes   Plan:   7/5:  Pt scheduled for surgery on 7/5 @ ~ 0900.  Heparin  gtt stopped @ 0300.  - will need to f/u plans for anticoag after procedure  Switch to heparin  level monitoring once aPTT and heparin  levels correlate.   Thank you for involving pharmacy in this patient's care.   Janecia Palau D Clinical Pharmacist 08/09/2023 4:14 AM

## 2023-08-09 NOTE — Anesthesia Postprocedure Evaluation (Signed)
 Anesthesia Post Note  Patient: MAKESHIA SEAT  Procedure(s) Performed: CHOLECYSTECTOMY, ROBOT-ASSISTED, LAPAROSCOPIC INDOCYANINE GREEN  FLUORESCENCE IMAGING (ICG)  Patient location during evaluation: PACU Anesthesia Type: General Level of consciousness: awake and alert Pain management: pain level controlled Vital Signs Assessment: post-procedure vital signs reviewed and stable Respiratory status: spontaneous breathing, nonlabored ventilation, respiratory function stable and patient connected to nasal cannula oxygen Cardiovascular status: blood pressure returned to baseline and stable Postop Assessment: no apparent nausea or vomiting Anesthetic complications: no   No notable events documented.   Last Vitals:  Vitals:   08/09/23 1035 08/09/23 1117  BP:  130/82  Pulse: 70 (!) 58  Resp: 19 16  Temp: 36.8 C 36.7 C  SpO2: 97% 100%    Last Pain:  Vitals:   08/09/23 1117  TempSrc: Oral  PainSc:                  Lendia LITTIE Mae

## 2023-08-09 NOTE — Progress Notes (Signed)
 PHARMACY - ANTICOAGULATION CONSULT NOTE  Pharmacy Consult for Heparin   Indication: pulmonary embolus   Allergies  Allergen Reactions   Penicillins Hives    Patient Measurements: Height: 5' 7 (170.2 cm) Weight: 95.1 kg (209 lb 10.5 oz) IBW/kg (Calculated) : 61.6 HEPARIN  DW (KG): 82.4  Vital Signs: Temp: 98.1 F (36.7 C) (07/05 1117) Temp Source: Oral (07/05 1117) BP: 130/82 (07/05 1117) Pulse Rate: 58 (07/05 1117)  Labs: Recent Labs    08/07/23 2121 08/08/23 1244 08/08/23 2046 08/09/23 0342  HGB 15.4*  --   --  14.0  HCT 45.2  --   --  40.7  PLT 227  --   --  190  APTT  --  51* 80*  --   HEPARINUNFRC  --  0.93*  --   --   CREATININE 0.72  --   --  0.72    Estimated Creatinine Clearance: 120.6 mL/min (by C-G formula based on SCr of 0.72 mg/dL).   Medical History: Past Medical History:  Diagnosis Date   Acid reflux    on no RX at present   Anemia 12/03/2022   Blood transfusion without reported diagnosis 03/03/2022   Depression 03/05/2022   Hemorrhoid 11/2015   starting on past Sat. Pt states it is on the outside   Hypertension    Tobacco abuse     Medications:  Medications Prior to Admission  Medication Sig Dispense Refill Last Dose/Taking   apixaban  (ELIQUIS ) 5 MG TABS tablet TAKE 1 TABLET (5 MG TOTAL) BY MOUTH 2 (TWO) TIMES DAILY. + HETEROZYGOUS FVL 180 tablet 0 08/07/2023 at  8:30 PM   labetalol  (NORMODYNE ) 100 MG tablet Take 1 tablet (100 mg total) by mouth 2 (two) times daily. 180 tablet 3 08/07/2023 at  8:30 PM   triamcinolone  cream (KENALOG ) 0.1 % Apply 1 Application topically 2 (two) times daily. 30 g 0 08/07/2023   ALPRAZolam  (XANAX ) 0.25 MG tablet Take 1 tablet (0.25 mg total) by mouth 2 (two) times daily as needed for anxiety. (Patient not taking: Reported on 08/08/2023) 20 tablet 0 Not Taking   hydrOXYzine  (ATARAX ) 25 MG tablet Take 1 tablet (25 mg total) by mouth every 6 (six) hours as needed for itching. (Patient not taking: Reported on 08/08/2023) 30  tablet 2 Not Taking   venlafaxine  (EFFEXOR ) 37.5 MG tablet Take 1 tablet (37.5 mg total) by mouth 2 (two) times daily. (Patient not taking: Reported on 08/08/2023) 180 tablet 1 Not Taking    Assessment: Pharmacy consulted to dose heparin  in this 33 year old female admitted with cholecystitis.  Pt was on Eliquis  5 mg PO BID PTA for PE, last dose on 7/3 @ 1830. Heparin  was previously started but paused this morning for laparoscopic cholecystectomy (now completed)  Goal of Therapy:  anti-Xa level 0.3-0.7 units/ml aPTT 66 - 102 seconds Monitor platelets by anticoagulation protocol: Yes   Plan:  ---per request of Dr Jordis we will resume heparin  infusion at 1600 units/hr beginning tonight ---will check aPTT and anti-Xa level 6 hours after resuming heparin  (we will use aPTT to guide therapy until aPTT and anti-Xa level level correlate) ---CBC daily while on IV heparin   Thank you for involving pharmacy in this patient's care.   Adriana JONETTA Bolster Clinical Pharmacist 08/09/2023 12:10 PM

## 2023-08-10 ENCOUNTER — Encounter: Payer: Self-pay | Admitting: Surgery

## 2023-08-10 DIAGNOSIS — K819 Cholecystitis, unspecified: Secondary | ICD-10-CM | POA: Diagnosis not present

## 2023-08-10 LAB — CBC
HCT: 38 % (ref 36.0–46.0)
Hemoglobin: 12.8 g/dL (ref 12.0–15.0)
MCH: 30.8 pg (ref 26.0–34.0)
MCHC: 33.7 g/dL (ref 30.0–36.0)
MCV: 91.3 fL (ref 80.0–100.0)
Platelets: 170 K/uL (ref 150–400)
RBC: 4.16 MIL/uL (ref 3.87–5.11)
RDW: 13.1 % (ref 11.5–15.5)
WBC: 7.7 K/uL (ref 4.0–10.5)
nRBC: 0 % (ref 0.0–0.2)

## 2023-08-10 LAB — HEPARIN LEVEL (UNFRACTIONATED)
Heparin Unfractionated: 0.57 [IU]/mL (ref 0.30–0.70)
Heparin Unfractionated: 0.54 [IU]/mL (ref 0.30–0.70)

## 2023-08-10 LAB — APTT: aPTT: 66 s — ABNORMAL HIGH (ref 24–36)

## 2023-08-10 MED ORDER — CIPROFLOXACIN HCL 500 MG PO TABS: 500.0000 mg | ORAL_TABLET | Freq: Two times a day (BID) | ORAL | 0 refills | Status: AC

## 2023-08-10 MED ORDER — METRONIDAZOLE 500 MG PO TABS
500.0000 mg | ORAL_TABLET | Freq: Two times a day (BID) | ORAL | 0 refills | Status: AC
Start: 2023-08-10 — End: 2023-08-14

## 2023-08-10 NOTE — Plan of Care (Signed)

## 2023-08-10 NOTE — Discharge Summary (Signed)
 Physician Discharge Summary   Jasmine Gray  female DOB: 1990/03/21  FMW:[id number]  PCP: Donzella Lauraine SAILOR, DO  Admit date: 08/07/2023 Discharge date: 08/10/2023  Admitted From: home Disposition:  home CODE STATUS: Full code  Discharge Instructions     No wound care   Complete by: As directed       Hospital Course:  For full details, please see H&P, progress notes, consult notes and ancillary notes.  Briefly,  Jasmine Gray is a 33 y.o. 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     Right upper quadrant ultrasound showing cholelithiasis and gallbladder sludge, in the setting of mild gallbladder wall thickening and a positive sonographic Murphy's sign, consistent with acute cholecystitis   * Acute Cholecystitis S/p cholecystectomy on 08/09/23 --received 3 days of empiric ceftriaxone  and flagyl  and discharged on 4 more days of Cipro  and Flagyl .  Pt was cleared for discharge by GenSurg.   Chronic anticoagulation Heterozygous factor V Leiden  History of DVT PE --home Eliquis  held and pt was on heparin  gtt for bridging.  Hgb was stable after surgery on heparin  gtt.  Pt to resume Eliquis  after discharge.   Essential hypertension Continue home labetalol    Anxiety and depression --pt not taking Xanax  or Effexor  PTA   Rash over left forearm --triamcinolone  cream   Hypokalemia --supplemented PRN   Unless noted above, medications under STOP list are ones pt was not taking PTA.  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 The score is based on dignosis, age, lab data, medications, orders, and past utilization.   Low:  0-14.9   Medium: 15-21.9   High: 22-29.9   Extreme: 30 and above         Discharge Instructions:  Allergies as of 08/10/2023       Reactions   Penicillins Hives        Medication List     STOP taking these medications    ALPRAZolam  0.25 MG tablet Commonly known as: XANAX    hydrOXYzine  25 MG tablet Commonly known as: ATARAX    venlafaxine  37.5 MG tablet Commonly known as: EFFEXOR        TAKE these medications    ciprofloxacin  500 MG tablet Commonly known as: Cipro  Take 1 tablet (500 mg total) by mouth 2 (two) times daily for 4 days.   Eliquis  5 MG Tabs tablet Generic drug: apixaban  TAKE 1 TABLET (5 MG TOTAL) BY MOUTH 2 (TWO) TIMES DAILY. + HETEROZYGOUS FVL   labetalol  100 MG tablet Commonly known as: NORMODYNE  Take 1 tablet (100 mg total) by mouth 2 (two) times  daily.   metroNIDAZOLE  500 MG tablet Commonly known as: FLAGYL  Take 1 tablet (500 mg total) by mouth 2 (two) times daily for 4 days.   triamcinolone  cream 0.1 % Commonly known as: KENALOG  Apply 1 Application topically 2 (two) times daily.         Follow-up Information     Donzella Lauraine SAILOR, DO Follow up in 1 week(s).   Specialty: Family Medicine Contact information: [address] [place] [address] [place] [address] [phone]                 Allergies  Allergen Reactions   Penicillins Hives     The  results of significant diagnostics from this hospitalization (including imaging, microbiology, ancillary and laboratory) are listed below for reference.   Consultations:   Procedures/Studies: US  ABDOMEN LIMITED RUQ (LIVER/GB) Result Date: 08/08/2023 CLINICAL DATA:  Right upper quadrant pain x2 weeks. EXAM: ULTRASOUND ABDOMEN LIMITED RIGHT UPPER QUADRANT COMPARISON:  July 30, 2021 FINDINGS: Gallbladder: Echogenic sludge and numerous gallstones are present within the gallbladder lumen (the largest measures 1.7 cm). An additional gallstone is seen within the neck of the gallbladder. The gallbladder wall measures 4.1 mm in thickness. A positive sonographic Beverley sign is noted by the sonographer. Common bile duct: Diameter: 2.4 mm Liver: No focal lesion identified. Within normal limits in parenchymal echogenicity. Portal vein is patent on color Doppler imaging with normal direction of blood flow towards the liver. Other: None. IMPRESSION: Cholelithiasis and gallbladder sludge, in the setting of mild gallbladder wall thickening and a positive sonographic Murphy's sign, consistent with acute cholecystitis. Electronically Signed   By: Suzen Dials M.D.   On: 08/08/2023 00:00      Labs: BNP (last 3 results) No results for input(s): BNP in the last 8760 hours. Basic Metabolic Panel: Recent Labs  Lab 08/07/23 2121 08/09/23 0342  NA 138 138  K 3.5 3.4*  CL 103 107  CO2 24 22  GLUCOSE 84 90  BUN 7 6  CREATININE 0.72 0.72  CALCIUM  9.5 8.8*  MG  --  2.3   Liver Function Tests: Recent Labs  Lab 08/07/23 2121 08/09/23 0342  AST 21 24  ALT 21 23  ALKPHOS 70 65  BILITOT 0.4 <0.2  PROT 8.3* 7.2  ALBUMIN 4.5 3.9   Recent Labs  Lab 08/07/23 2121  LIPASE 24   No results for input(s): AMMONIA  in the last 168 hours. CBC: Recent Labs  Lab 08/07/23 2121 08/09/23 0342 08/10/23 1011  WBC 8.1 5.7 7.7  HGB 15.4* 14.0 12.8  HCT 45.2 40.7 38.0  MCV 90.6 89.5 91.3  PLT 227 190 170    Cardiac Enzymes: No results for input(s): CKTOTAL, CKMB, CKMBINDEX, TROPONINI in the last 168 hours. BNP: Invalid input(s): POCBNP CBG: No results for input(s): GLUCAP in the last 168 hours. D-Dimer No results for input(s): DDIMER in the last 72 hours. Hgb A1c No results for input(s): HGBA1C in the last 72 hours. Lipid Profile No results for input(s): CHOL, HDL, LDLCALC, TRIG, CHOLHDL, LDLDIRECT in the last 72 hours. Thyroid function studies No results for input(s): TSH, T4TOTAL, T3FREE, THYROIDAB in the last 72 hours.  Invalid input(s): FREET3 Anemia work up No results for input(s): VITAMINB12, FOLATE, FERRITIN, TIBC, IRON , RETICCTPCT in the last 72 hours. Urinalysis    Component Value Date/Time   COLORURINE STRAW (A) 08/07/2023 2121   APPEARANCEUR CLEAR (A) 08/07/2023 2121   APPEARANCEUR Clear 09/03/2021 1153   LABSPEC 1.004 (L) 08/07/2023 2121   LABSPEC 1.001  11/17/2013 2051   PHURINE 6.0 08/07/2023 2121   GLUCOSEU NEGATIVE 08/07/2023 2121   GLUCOSEU Negative 11/17/2013 2051   HGBUR SMALL (A) 08/07/2023 2121   BILIRUBINUR NEGATIVE 08/07/2023 2121   BILIRUBINUR neg 04/18/2022 1140   BILIRUBINUR Negative 09/03/2021 1153   BILIRUBINUR Negative 11/17/2013 2051   KETONESUR NEGATIVE 08/07/2023 2121   PROTEINUR NEGATIVE 08/07/2023 2121   UROBILINOGEN 0.2 04/18/2022 1140   NITRITE NEGATIVE 08/07/2023 2121   LEUKOCYTESUR MODERATE (A) 08/07/2023 2121   LEUKOCYTESUR Negative 11/17/2013 2051   Sepsis Labs Recent Labs  Lab 08/07/23 2121 08/09/23 0342 08/10/23 1011  WBC 8.1 5.7 7.7   Microbiology No results found for this or any previous visit (from the past 240 hours).   Total time spend on discharging this patient, including the last patient exam, discussing the hospital stay, instructions for ongoing care as it relates to all pertinent caregivers, as well as preparing the medical discharge records, prescriptions,  and/or referrals as applicable, is 35 minutes.    Ellouise Haber, MD  [facility] 08/10/2023, 3:35 PM

## 2023-08-10 NOTE — Progress Notes (Signed)
 PHARMACY - ANTICOAGULATION CONSULT NOTE  Pharmacy Consult for Heparin   Indication: pulmonary embolus   Allergies  Allergen Reactions   Penicillins Hives    Patient Measurements: Height: 5' 7 (170.2 cm) Weight: 95.1 kg (209 lb 10.5 oz) IBW/kg (Calculated) : 61.6 HEPARIN  DW (KG): 82.4  Vital Signs: Temp: 98.9 F (37.2 C) (07/06 0321) Temp Source: Oral (07/06 0321) BP: 120/63 (07/06 0321) Pulse Rate: 79 (07/06 0321)  Labs: Recent Labs    08/07/23 2121 08/08/23 1244 08/08/23 2046 08/09/23 0342 08/09/23 1237 08/10/23 0445  HGB 15.4*  --   --  14.0  --   --   HCT 45.2  --   --  40.7  --   --   PLT 227  --   --  190  --   --   APTT  --  51* 80*  --   --  66*  HEPARINUNFRC  --  0.93*  --   --  0.27* 0.57  CREATININE 0.72  --   --  0.72  --   --     Estimated Creatinine Clearance: 120.6 mL/min (by C-G formula based on SCr of 0.72 mg/dL).   Medical History: Past Medical History:  Diagnosis Date   Acid reflux    on no RX at present   Anemia 12/03/2022   Blood transfusion without reported diagnosis 03/03/2022   Depression 03/05/2022   Hemorrhoid 11/2015   starting on past Sat. Pt states it is on the outside   Hypertension    Tobacco abuse     Medications:  Medications Prior to Admission  Medication Sig Dispense Refill Last Dose/Taking   apixaban  (ELIQUIS ) 5 MG TABS tablet TAKE 1 TABLET (5 MG TOTAL) BY MOUTH 2 (TWO) TIMES DAILY. + HETEROZYGOUS FVL 180 tablet 0 08/07/2023 at  8:30 PM   labetalol  (NORMODYNE ) 100 MG tablet Take 1 tablet (100 mg total) by mouth 2 (two) times daily. 180 tablet 3 08/07/2023 at  8:30 PM   triamcinolone  cream (KENALOG ) 0.1 % Apply 1 Application topically 2 (two) times daily. 30 g 0 08/07/2023   ALPRAZolam  (XANAX ) 0.25 MG tablet Take 1 tablet (0.25 mg total) by mouth 2 (two) times daily as needed for anxiety. (Patient not taking: Reported on 08/08/2023) 20 tablet 0 Not Taking   hydrOXYzine  (ATARAX ) 25 MG tablet Take 1 tablet (25 mg total) by mouth  every 6 (six) hours as needed for itching. (Patient not taking: Reported on 08/08/2023) 30 tablet 2 Not Taking   venlafaxine  (EFFEXOR ) 37.5 MG tablet Take 1 tablet (37.5 mg total) by mouth 2 (two) times daily. (Patient not taking: Reported on 08/08/2023) 180 tablet 1 Not Taking    Assessment: Pharmacy consulted to dose heparin  in this 33 year old female admitted with cholecystitis.  Pt was on Eliquis  5 mg PO BID PTA for PE, last dose on 7/3 @ 1830. Heparin  was previously started but paused this morning for laparoscopic cholecystectomy (now completed)  Goal of Therapy:  anti-Xa level 0.3-0.7 units/ml aPTT 66 - 102 seconds Monitor platelets by anticoagulation protocol: Yes   Plan:  7/6 @ 0445:  aPTT = 66, HL = 0.57 - aPTT therapeutic X 1 (following restart), now correlating with HL - will use HL to guide dosing from here on  - continue pt on current rate and recheck HL in 6 hrs ---CBC daily while on IV heparin   Thank you for involving pharmacy in this patient's care.   Kesean Serviss D Clinical Pharmacist 08/10/2023 5:57 AM

## 2023-08-10 NOTE — Progress Notes (Signed)
 PHARMACY - ANTICOAGULATION CONSULT NOTE  Pharmacy Consult for Heparin   Indication: pulmonary embolus   Allergies  Allergen Reactions   Penicillins Hives    Patient Measurements: Height: 5' 7 (170.2 cm) Weight: 95.1 kg (209 lb 10.5 oz) IBW/kg (Calculated) : 61.6 HEPARIN  DW (KG): 82.4  Vital Signs: Temp: 98.6 F (37 C) (07/06 0830) Temp Source: Oral (07/06 0830) BP: 117/76 (07/06 0830) Pulse Rate: 63 (07/06 0830)  Labs: Recent Labs    08/07/23 2121 08/07/23 2121 08/08/23 1244 08/08/23 2046 08/09/23 0342 08/09/23 1237 08/10/23 0445 08/10/23 1011  HGB 15.4*  --   --   --  14.0  --   --  12.8  HCT 45.2  --   --   --  40.7  --   --  38.0  PLT 227  --   --   --  190  --   --  170  APTT  --   --  51* 80*  --   --  66*  --   HEPARINUNFRC  --    < > 0.93*  --   --  0.27* 0.57 0.54  CREATININE 0.72  --   --   --  0.72  --   --   --    < > = values in this interval not displayed.    Estimated Creatinine Clearance: 120.6 mL/min (by C-G formula based on SCr of 0.72 mg/dL).   Medical History: Past Medical History:  Diagnosis Date   Acid reflux    on no RX at present   Anemia 12/03/2022   Blood transfusion without reported diagnosis 03/03/2022   Depression 03/05/2022   Hemorrhoid 11/2015   starting on past Sat. Pt states it is on the outside   Hypertension    Tobacco abuse     Medications:  Medications Prior to Admission  Medication Sig Dispense Refill Last Dose/Taking   apixaban  (ELIQUIS ) 5 MG TABS tablet TAKE 1 TABLET (5 MG TOTAL) BY MOUTH 2 (TWO) TIMES DAILY. + HETEROZYGOUS FVL 180 tablet 0 08/07/2023 at  8:30 PM   labetalol  (NORMODYNE ) 100 MG tablet Take 1 tablet (100 mg total) by mouth 2 (two) times daily. 180 tablet 3 08/07/2023 at  8:30 PM   triamcinolone  cream (KENALOG ) 0.1 % Apply 1 Application topically 2 (two) times daily. 30 g 0 08/07/2023   ALPRAZolam  (XANAX ) 0.25 MG tablet Take 1 tablet (0.25 mg total) by mouth 2 (two) times daily as needed for anxiety.  (Patient not taking: Reported on 08/08/2023) 20 tablet 0 Not Taking   hydrOXYzine  (ATARAX ) 25 MG tablet Take 1 tablet (25 mg total) by mouth every 6 (six) hours as needed for itching. (Patient not taking: Reported on 08/08/2023) 30 tablet 2 Not Taking   venlafaxine  (EFFEXOR ) 37.5 MG tablet Take 1 tablet (37.5 mg total) by mouth 2 (two) times daily. (Patient not taking: Reported on 08/08/2023) 180 tablet 1 Not Taking    Assessment: Pharmacy consulted to dose heparin  in this 33 year old female admitted with cholecystitis.  Pt was on Eliquis  5 mg PO BID PTA for PE, last dose on 7/3 @ 1830. Heparin  was previously started but paused this morning for laparoscopic cholecystectomy (now completed)  7/6 @ 0445:  aPTT = 66, HL = 0.57, aPTT therapeutic X 1 (following restart), now correlating with HL 7/6 1011 HL 0.54, therapeutic x2  Goal of Therapy:  anti-Xa level 0.3-0.7 units/ml aPTT 66 - 102 seconds Monitor platelets by anticoagulation protocol: Yes   Plan:  7/6 1011 HL 0.54, therapeutic x2 - continue pt on current rate and recheck HL with am labs ---CBC daily while on IV heparin   Thank you for involving pharmacy in this patient's care.   Manvir Thorson A Clinical Pharmacist 08/10/2023 11:24 AM

## 2023-08-10 NOTE — Plan of Care (Signed)
  Problem: Education: Goal: Knowledge of General Education information will improve Description: Including pain rating scale, medication(s)/side effects and non-pharmacologic comfort measures 08/10/2023 0703 by Janit Orvin HERO, RN Outcome: Progressing 08/10/2023 0703 by Janit Orvin HERO, RN Outcome: Progressing   Problem: Health Behavior/Discharge Planning: Goal: Ability to manage health-related needs will improve 08/10/2023 0703 by Janit Orvin HERO, RN Outcome: Progressing 08/10/2023 0703 by Janit Orvin HERO, RN Outcome: Progressing   Problem: Clinical Measurements: Goal: Ability to maintain clinical measurements within normal limits will improve 08/10/2023 0703 by Janit Orvin HERO, RN Outcome: Progressing 08/10/2023 0703 by Janit Orvin HERO, RN Outcome: Progressing Goal: Will remain free from infection 08/10/2023 0703 by Janit Orvin HERO, RN Outcome: Progressing 08/10/2023 0703 by Janit Orvin HERO, RN Outcome: Progressing Goal: Diagnostic test results will improve 08/10/2023 0703 by Janit Orvin HERO, RN Outcome: Progressing 08/10/2023 0703 by Janit Orvin HERO, RN Outcome: Progressing Goal: Respiratory complications will improve 08/10/2023 0703 by Janit Orvin HERO, RN Outcome: Progressing 08/10/2023 0703 by Janit Orvin HERO, RN Outcome: Progressing Goal: Cardiovascular complication will be avoided 08/10/2023 0703 by Janit Orvin HERO, RN Outcome: Progressing 08/10/2023 0703 by Janit Orvin HERO, RN Outcome: Progressing   Problem: Activity: Goal: Risk for activity intolerance will decrease 08/10/2023 0703 by Janit Orvin HERO, RN Outcome: Progressing 08/10/2023 0703 by Janit Orvin HERO, RN Outcome: Progressing   Problem: Nutrition: Goal: Adequate nutrition will be maintained 08/10/2023 0703 by Janit Orvin HERO, RN Outcome: Progressing 08/10/2023 0703 by Janit Orvin HERO, RN Outcome: Progressing   Problem: Coping: Goal: Level of anxiety will decrease 08/10/2023 0703 by Janit Orvin HERO, RN Outcome: Progressing 08/10/2023 0703 by Janit Orvin HERO, RN Outcome: Progressing   Problem: Elimination: Goal: Will not experience complications related to bowel motility 08/10/2023 0703 by Janit Orvin HERO, RN Outcome: Progressing 08/10/2023 0703 by Janit Orvin HERO, RN Outcome: Progressing Goal: Will not experience complications related to urinary retention 08/10/2023 0703 by Janit Orvin HERO, RN Outcome: Progressing 08/10/2023 0703 by Janit Orvin HERO, RN Outcome: Progressing   Problem: Pain Managment: Goal: General experience of comfort will improve and/or be controlled 08/10/2023 0703 by Janit Orvin HERO, RN Outcome: Progressing 08/10/2023 0703 by Janit Orvin HERO, RN Outcome: Progressing   Problem: Safety: Goal: Ability to remain free from injury will improve 08/10/2023 0703 by Janit Orvin HERO, RN Outcome: Progressing 08/10/2023 0703 by Janit Orvin HERO, RN Outcome: Progressing   Problem: Skin Integrity: Goal: Risk for impaired skin integrity will decrease 08/10/2023 0703 by Janit Orvin HERO, RN Outcome: Progressing 08/10/2023 0703 by Janit Orvin HERO, RN Outcome: Progressing

## 2023-08-10 NOTE — Progress Notes (Signed)
 Pt seen and examined. AVSS Doing very well Hb stable No evidence of bleeding Tolerating regular diet  PE NAD Abd: soft, incision c/d/I no peritonitis  A/P doing well Ready DC home

## 2023-08-10 NOTE — Progress Notes (Signed)
   08/10/23 0821  TOC Brief Assessment  Insurance and Status Reviewed  Patient has primary care physician Yes (PARDUE, SARAH N)  Home environment has been reviewed From home, significant other listed  Prior level of function: Independent  Prior/Current Home Services No current home services  Social Drivers of Health Review SDOH reviewed no interventions necessary  Readmission risk has been reviewed Yes  Transition of care needs no transition of care needs at this time    Transition of Care Department [facility]) has reviewed patient and no TOC needs have been identified at this time. We will continue to monitor patient advancement through interdisciplinary progression rounds. If new patient transition needs arise, please place a TOC consult.

## 2023-08-12 LAB — SURGICAL PATHOLOGY

## 2023-08-15 ENCOUNTER — Telehealth: Admitting: Emergency Medicine

## 2023-08-15 DIAGNOSIS — B3731 Acute candidiasis of vulva and vagina: Secondary | ICD-10-CM | POA: Diagnosis not present

## 2023-08-15 MED ORDER — FLUCONAZOLE 150 MG PO TABS: 150.0000 mg | ORAL_TABLET | Freq: Every day | ORAL | 0 refills | Status: DC

## 2023-08-15 NOTE — Progress Notes (Signed)

## 2023-08-28 ENCOUNTER — Ambulatory Visit: Admitting: Family Medicine

## 2023-09-02 ENCOUNTER — Other Ambulatory Visit: Payer: Self-pay | Admitting: Family Medicine

## 2023-09-02 DIAGNOSIS — Z86711 Personal history of pulmonary embolism: Secondary | ICD-10-CM

## 2023-09-02 DIAGNOSIS — Z86718 Personal history of other venous thrombosis and embolism: Secondary | ICD-10-CM

## 2023-09-05 ENCOUNTER — Ambulatory Visit: Admitting: Family Medicine

## 2023-10-01 ENCOUNTER — Ambulatory Visit: Admitting: Family Medicine

## 2023-11-05 ENCOUNTER — Ambulatory Visit: Payer: Self-pay

## 2023-11-06 ENCOUNTER — Telehealth: Admitting: Physician Assistant

## 2023-11-06 DIAGNOSIS — J019 Acute sinusitis, unspecified: Secondary | ICD-10-CM | POA: Diagnosis not present

## 2023-11-06 DIAGNOSIS — B9789 Other viral agents as the cause of diseases classified elsewhere: Secondary | ICD-10-CM | POA: Diagnosis not present

## 2023-11-06 MED ORDER — FLUTICASONE PROPIONATE 50 MCG/ACT NA SUSP: 2.0000 | Freq: Every day | NASAL | 0 refills | Status: DC

## 2023-11-06 MED ORDER — NAPROXEN 500 MG PO TABS: 500.0000 mg | ORAL_TABLET | Freq: Two times a day (BID) | ORAL | 0 refills | Status: DC

## 2023-11-06 NOTE — Progress Notes (Signed)
 E-Visit for [employer] Virus / COVID Screening  Your current symptoms could be consistent with COVID.  Please complete a Covid test either at home or check with your local pharmacy to see if they provide testing.    You have tested positive for COVID-19, meaning that you were infected with the novel coronavirus and could give the virus to others.  Most people with COVID-19 have mild illness and can recover at home without medical care. Do not leave your home, except to get medical care. Do not visit public areas and do not go to places where you are unable to wear a mask. It is important that you stay home  to take care for yourself and to help protect other people in your home and community.      Isolation Instructions:   You are to isolate at home until you have been fever free for at least 24 hours without a fever-reducing medication, and symptoms have been steadily improving for 24 hours. At that time,  you can end isolation but need to mask for an additional 5 days.  If you must be around other household members who do not have symptoms, you need to make sure that both you and the family members are masking consistently with a high-quality mask.  If you note any worsening of symptoms despite treatment, please seek an in-person evaluation ASAP. If you note any significant shortness of breath or any chest pain, please seek ER evaluation. Please do not delay care!  Go to the nearest hospital ED for assessment if fever/cough/breathlessness are severe or illness seems like a threat to life.    The following symptoms may appear 2-14 days after exposure: Fever Cough Shortness of breath or difficulty breathing Chills Repeated shaking with chills Muscle pain Headache Sore throat New loss of taste or smell Fatigue Congestion or runny nose Nausea or vomiting Diarrhea  You can use medication such as I have prescribed an anti-inflammatory - Naprosyn 500 mg. Take twice daily as needed for fever or  body aches for 2 weeks and I have prescribed Fluticasone nasal spray 2 sprays in each nostril one time per dayasal spray 2 sprays in each nostril one time per day  You may also take acetaminophen  (Tylenol ) as needed for fever.  HOME CARE Only take medications as instructed by your medical team. Drink plenty of fluids and get plenty of rest. A steam or ultrasonic humidifier can help if you have congestion.  GET HELP RIGHT AWAY IF YOU HAVE EMERGENCY WARNING SIGNS.  Call 911 or proceed to your closest emergency facility if: You develop worsening high fever. Trouble breathing Bluish lips or face Persistent pain or pressure in the chest New confusion Inability to wake or stay awake You cough up blood. Your symptoms become more severe Inability to hold down food or fluids  This list is not all possible symptoms. Contact your medical provider for any symptoms that are severe or concerning to you.   Your e-visit answers were reviewed by a board certified advanced clinical practitioner to complete your personal care plan.  Depending on the condition, your plan could have included both over the counter or prescription medications.  If there is a problem, please reply once you have received a response from your provider.  Your safety is important to us .  If you have drug allergies check your prescription carefully.    You can use MyChart to ask questions about today's visit, request a non-urgent call back, or ask for  a work or school excuse for 24 hours related to this e-Visit. If it has been greater than 24 hours you will need to follow up with your provider or enter a new e-Visit to address those concerns. You will get an e-mail in the next two days asking about your experience.  I hope that your e-visit has been valuable and will speed your recovery. Thank you for using e-visits.    I have spent 5 minutes in review of e-visit questionnaire, review and updating patient chart, medical decision  making and response to patient.   Delon CHRISTELLA Dickinson, PA-C

## 2023-11-24 ENCOUNTER — Other Ambulatory Visit: Payer: Self-pay | Admitting: Genetic Counselor

## 2023-11-24 DIAGNOSIS — Z006 Encounter for examination for normal comparison and control in clinical research program: Secondary | ICD-10-CM

## 2023-12-08 ENCOUNTER — Other Ambulatory Visit: Payer: Self-pay | Admitting: Family Medicine

## 2023-12-08 DIAGNOSIS — I10 Essential (primary) hypertension: Secondary | ICD-10-CM

## 2023-12-09 ENCOUNTER — Encounter: Payer: Self-pay | Admitting: Family Medicine

## 2023-12-09 ENCOUNTER — Other Ambulatory Visit: Payer: Self-pay

## 2023-12-09 ENCOUNTER — Ambulatory Visit: Payer: Self-pay

## 2023-12-09 DIAGNOSIS — I10 Essential (primary) hypertension: Secondary | ICD-10-CM

## 2023-12-09 NOTE — Telephone Encounter (Signed)
 LOV- 12/06/2022 NOV- 12/16/2023 LRF- 11/05/2022      Outpatient Medication Detail     Disp Refills Start End    labetalol  (NORMODYNE ) 100 MG tablet 180 tablet 3 11/05/2022 --    Sig - Route: Take 1 tablet (100 mg total) by mouth 2 (two) times daily. - Oral    Sent to pharmacy as: labetalol  (NORMODYNE ) 100 MG tablet    E-Prescribing Status: Receipt confirmed by pharmacy (11/05/2022  1:54 PM EDT)

## 2023-12-09 NOTE — Telephone Encounter (Signed)
 LOV-12/06/2022 NOV- None LRF

## 2023-12-09 NOTE — Telephone Encounter (Signed)
 FYI Only or Action Required?: Action required by provider: update on patient condition.  Patient was last seen in primary care on 12/06/2022 by Donzella Lauraine SAILOR, DO.  Called Nurse Triage reporting Medication Problem.  Triage Disposition: Call PCP Now  Patient/caregiver understands and will follow disposition?: Yes  Copied from CRM [id number]. Topic: Clinical - Medication Question >> Dec 09, 2023 11:12 AM Deaijah H wrote: Reason for CRM: Patient called in stating she took her last BP medication labetalol  (NORMODYNE ) 100 MG tablet & would like to know if Dr. Donzella could send in today >> Dec 09, 2023  2:56 PM Ivette P wrote: Pt called in because wants to know if refill will be sent in. Advised provider has not seen request and still waiting response.    Pt wanted to know if she will be ok if she does not take medication, transferred to NT.  Reason for Disposition  [1] Caller has URGENT medicine question about med that primary care doctor (or NP/PA) or specialist prescribed AND [2] triager unable to answer question  Answer Assessment - Initial Assessment Questions Patient has a scheduled office visit planned on 12/16/2023. Educated patient on symptoms to watch for with high blood pressure. Explained to patient that the office is working on a refill for her. Patient is needing a call back today.   1. NAME of MEDICINE: What medicine(s) are you calling about?     Labetalol  2. QUESTION: What is your question? (e.g., double dose of medicine, side effect)     Patient is out of medication today-patient is needing a refill of her medication to avoid going without her medication today.  3. PRESCRIBER: Who prescribed the medicine? Reason: if prescribed by specialist, call should be referred to that group.     pardue 4. SYMPTOMS: Do you have any symptoms? If Yes, ask: What symptoms are you having?  How bad are the symptoms (e.g., mild, moderate, severe)     No symptoms currently 5. PREGNANCY:   Is there any chance that you are pregnant? When was your last menstrual period?     no  Protocols used: Medication Question Call-A-AH

## 2023-12-09 NOTE — Telephone Encounter (Unsigned)
 Copied from CRM [id number]. Topic: Clinical - Medication Question >> Dec 09, 2023 11:12 AM Jasmine Gray wrote: Reason for CRM: Patient called in stating she took her last BP medication labetalol  (NORMODYNE ) 100 MG tablet & would like to know if Dr. Donzella could send in today

## 2023-12-09 NOTE — Telephone Encounter (Signed)
 LRF-11/05/2022   Outpatient Medication Detail   Disp Refills Start End   labetalol  (NORMODYNE ) 100 MG tablet 180 tablet 3 11/05/2022 --   Sig - Route: Take 1 tablet (100 mg total) by mouth 2 (two) times daily. - Oral   Sent to pharmacy as: labetalol  (NORMODYNE ) 100 MG tablet   E-Prescribing Status: Receipt confirmed by pharmacy (11/05/2022  1:54 PM EDT)

## 2023-12-10 ENCOUNTER — Telehealth

## 2023-12-10 ENCOUNTER — Other Ambulatory Visit: Payer: Self-pay

## 2023-12-10 DIAGNOSIS — Z760 Encounter for issue of repeat prescription: Secondary | ICD-10-CM | POA: Diagnosis not present

## 2023-12-10 DIAGNOSIS — I10 Essential (primary) hypertension: Secondary | ICD-10-CM | POA: Diagnosis not present

## 2023-12-10 MED ORDER — LABETALOL HCL 100 MG PO TABS: 100.0000 mg | ORAL_TABLET | Freq: Two times a day (BID) | ORAL | 0 refills | Status: DC

## 2023-12-10 NOTE — Telephone Encounter (Signed)
 Sent 30 day supply to preferred pharmacy, called to inform patient that it has been sent.

## 2023-12-10 NOTE — Telephone Encounter (Signed)
 Copied from CRM #[id number]. Topic: Clinical - Medication Question >> Dec 10, 2023 11:18 AM Delon DASEN wrote: Reason for CRM: labetalol  (NORMODYNE ) 100 MG tablet- checking status of refill, ran out yesterday morning

## 2023-12-10 NOTE — Telephone Encounter (Signed)
 Okay to send 30 day supply?

## 2023-12-11 NOTE — Telephone Encounter (Signed)
 Prescription was sent to preferred pharmacy yesterday 12/10/2023 and called to inform patient as well.

## 2023-12-16 ENCOUNTER — Ambulatory Visit: Admitting: Family Medicine

## 2023-12-17 ENCOUNTER — Ambulatory Visit: Admitting: Family Medicine

## 2023-12-21 ENCOUNTER — Telehealth: Admitting: Physician Assistant

## 2023-12-21 DIAGNOSIS — J019 Acute sinusitis, unspecified: Secondary | ICD-10-CM

## 2023-12-21 DIAGNOSIS — B9689 Other specified bacterial agents as the cause of diseases classified elsewhere: Secondary | ICD-10-CM | POA: Diagnosis not present

## 2023-12-21 MED ORDER — DOXYCYCLINE HYCLATE 100 MG PO TABS: 100.0000 mg | ORAL_TABLET | Freq: Two times a day (BID) | ORAL | 0 refills | Status: DC

## 2023-12-21 MED ORDER — IPRATROPIUM BROMIDE 0.03 % NA SOLN: 2.0000 | Freq: Two times a day (BID) | NASAL | 0 refills | Status: DC

## 2023-12-21 NOTE — Patient Instructions (Signed)
 Jasmine Gray, thank you for joining Delon CHRISTELLA Dickinson, PA-C for today's virtual visit.  While this provider is not your primary care provider (PCP), if your PCP is located in our provider database this encounter information will be shared with them immediately following your visit.   A [facility] MyChart account gives you access to today's visit and all your visits, tests, and labs performed at [facility]  click here if you don't have a [facility] MyChart account or go to [email or web address]  Consent: (Patient) Jasmine Gray provided verbal consent for this virtual visit at the beginning of the encounter.  Current Medications:  Current Outpatient Medications:    doxycycline (VIBRA-TABS) 100 MG tablet, Take 1 tablet (100 mg total) by mouth 2 (two) times daily., Disp: 14 tablet, Rfl: 0   ipratropium (ATROVENT ) 0.03 % nasal spray, Place 2 sprays into both nostrils every 12 (twelve) hours., Disp: 30 mL, Rfl: 0   ELIQUIS  5 MG TABS tablet, TAKE 1 TABLET (5 MG TOTAL) BY MOUTH 2 (TWO) TIMES DAILY. + HETEROZYGOUS FVL, Disp: 180 tablet, Rfl: 1   fluticasone  (FLONASE ) 50 MCG/ACT nasal spray, Place 2 sprays into both nostrils daily., Disp: 16 g, Rfl: 0   labetalol  (NORMODYNE ) 100 MG tablet, Take 1 tablet (100 mg total) by mouth 2 (two) times daily., Disp: 60 tablet, Rfl: 0   naproxen  (NAPROSYN ) 500 MG tablet, Take 1 tablet (500 mg total) by mouth 2 (two) times daily with a meal., Disp: 30 tablet, Rfl: 0   Medications ordered in this encounter:  Meds ordered this encounter  Medications   doxycycline (VIBRA-TABS) 100 MG tablet    Sig: Take 1 tablet (100 mg total) by mouth 2 (two) times daily.    Dispense:  14 tablet    Refill:  0    Supervising Provider:   LAMPTEY, PHILIP O [[id number]]   ipratropium (ATROVENT ) 0.03 % nasal spray    Sig: Place 2 sprays into both nostrils every 12 (twelve) hours.    Dispense:  30 mL    Refill:  0    Supervising Provider:   BLAISE ALEENE KIDD  [[id number]]     *If you need refills on other medications prior to your next appointment, please contact your pharmacy*  Follow-Up: Call back or seek an in-person evaluation if the symptoms worsen or if the condition fails to improve as anticipated.  [facility] [phone]  Other Instructions Sinus Infection, Adult A sinus infection, also called sinusitis, is inflammation of your sinuses. Sinuses are hollow spaces in the bones around your face. Your sinuses are located: Around your eyes. In the middle of your forehead. Behind your nose. In your cheekbones. Mucus normally drains out of your sinuses. When your nasal tissues become inflamed or swollen, mucus can become trapped or blocked. This allows bacteria, viruses, and fungi to grow, which leads to infection. Most infections of the sinuses are caused by a virus. A sinus infection can develop quickly. It can last for up to 4 weeks (acute) or for more than 12 weeks (chronic). A sinus infection often develops after a cold. What are the causes? This condition is caused by anything that creates swelling in the sinuses or stops mucus from draining. This includes: Allergies. Asthma. Infection from bacteria or viruses. Deformities or blockages in your nose or sinuses. Abnormal growths in the nose (nasal polyps). Pollutants, such as chemicals or irritants in the air. Infection from fungi. This is rare. What increases the risk? You  are more likely to develop this condition if you: Have a weak body defense system (immune system). Do a lot of swimming or diving. Overuse nasal sprays. Smoke. What are the signs or symptoms? The main symptoms of this condition are pain and a feeling of pressure around the affected sinuses. Other symptoms include: Stuffy nose or congestion that makes it difficult to breathe through your nose. Thick yellow or greenish drainage from your nose. Tenderness, swelling, and warmth over the affected  sinuses. A cough that may get worse at night. Decreased sense of smell and taste. Extra mucus that collects in the throat or the back of the nose (postnasal drip) causing a sore throat or bad breath. Tiredness (fatigue). Fever. How is this diagnosed? This condition is diagnosed based on: Your symptoms. Your medical history. A physical exam. Tests to find out if your condition is acute or chronic. This may include: Checking your nose for nasal polyps. Viewing your sinuses using a device that has a light (endoscope). Testing for allergies or bacteria. Imaging tests, such as an MRI or CT scan. In rare cases, a bone biopsy may be done to rule out more serious types of fungal sinus disease. How is this treated? Treatment for a sinus infection depends on the cause and whether your condition is chronic or acute. If caused by a virus, your symptoms should go away on their own within 10 days. You may be given medicines to relieve symptoms. They include: Medicines that shrink swollen nasal passages (decongestants). A spray that eases inflammation of the nostrils (topical intranasal corticosteroids). Rinses that help get rid of thick mucus in your nose (nasal saline washes). Medicines that treat allergies (antihistamines). Over-the-counter pain relievers. If caused by bacteria, your health care provider may recommend waiting to see if your symptoms improve. Most bacterial infections will get better without antibiotic medicine. You may be given antibiotics if you have: A severe infection. A weak immune system. If caused by narrow nasal passages or nasal polyps, surgery may be needed. Follow these instructions at home: Medicines Take, use, or apply over-the-counter and prescription medicines only as told by your health care provider. These may include nasal sprays. If you were prescribed an antibiotic medicine, take it as told by your health care provider. Do not stop taking the antibiotic even if  you start to feel better. Hydrate and humidify  Drink enough fluid to keep your urine pale yellow. Staying hydrated will help to thin your mucus. Use a cool mist humidifier to keep the humidity level in your home above 50%. Inhale steam for 10-15 minutes, 3-4 times a day, or as told by your health care provider. You can do this in the bathroom while a hot shower is running. Limit your exposure to cool or dry air. Rest Rest as much as possible. Sleep with your head raised (elevated). Make sure you get enough sleep each night. General instructions  Apply a warm, moist washcloth to your face 3-4 times a day or as told by your health care provider. This will help with discomfort. Use nasal saline washes as often as told by your health care provider. Wash your hands often with soap and water to reduce your exposure to germs. If soap and water are not available, use hand sanitizer. Do not smoke. Avoid being around people who are smoking (secondhand smoke). Keep all follow-up visits. This is important. Contact a health care provider if: You have a fever. Your symptoms get worse. Your symptoms do not improve  within 10 days. Get help right away if: You have a severe headache. You have persistent vomiting. You have severe pain or swelling around your face or eyes. You have vision problems. You develop confusion. Your neck is stiff. You have trouble breathing. These symptoms may be an emergency. Get help right away. Call 911. Do not wait to see if the symptoms will go away. Do not drive yourself to the hospital. Summary A sinus infection is soreness and inflammation of your sinuses. Sinuses are hollow spaces in the bones around your face. This condition is caused by nasal tissues that become inflamed or swollen. The swelling traps or blocks the flow of mucus. This allows bacteria, viruses, and fungi to grow, which leads to infection. If you were prescribed an antibiotic medicine, take it as  told by your health care provider. Do not stop taking the antibiotic even if you start to feel better. Keep all follow-up visits. This is important. This information is not intended to replace advice given to you by your health care provider. Make sure you discuss any questions you have with your health care provider. Document Revised: 12/26/2020 Document Reviewed: 12/26/2020 Elsevier Patient Education  2024 Elsevier Inc.   If you have been instructed to have an in-person evaluation today at a local Urgent Care facility, please use the link below. It will take you to a list of all of our available [facility], including address, phone number and hours of operation. Please do not delay care.  [facility]  If you or a family member do not have a primary care provider, use the link below to schedule a visit and establish care. When you choose a [facility] primary care physician or advanced practice provider, you gain a long-term partner in health. Find a Primary Care Provider  Learn more about [facility]'s in-office and virtual care options: [facility] - Get Care Now

## 2023-12-21 NOTE — Progress Notes (Signed)
 Virtual Visit Consent   Jasmine Gray, you are scheduled for a virtual visit with a [facility] provider today. Just as with appointments in the office, your consent must be obtained to participate. Your consent will be active for this visit and any virtual visit you may have with one of our providers in the next 365 days. If you have a MyChart account, a copy of this consent can be sent to you electronically.  As this is a virtual visit, video technology does not allow for your provider to perform a traditional examination. This may limit your provider's ability to fully assess your condition. If your provider identifies any concerns that need to be evaluated in person or the need to arrange testing (such as labs, EKG, etc.), we will make arrangements to do so. Although advances in technology are sophisticated, we cannot ensure that it will always work on either your end or our end. If the connection with a video visit is poor, the visit may have to be switched to a telephone visit. With either a video or telephone visit, we are not always able to ensure that we have a secure connection.  By engaging in this virtual visit, you consent to the provision of healthcare and authorize for your insurance to be billed (if applicable) for the services provided during this visit. Depending on your insurance coverage, you may receive a charge related to this service.  I need to obtain your verbal consent now. Are you willing to proceed with your visit today? Jasmine Gray has provided verbal consent on 12/21/2023 for a virtual visit (video or telephone). Delon CHRISTELLA Dickinson, PA-C  Date: 12/21/2023 11:30 AM   Virtual Visit via Video Note   I, Delon CHRISTELLA Dickinson, connected with  Jasmine Gray  ([id number], [birth year]) on 12/21/23 at 11:30 AM EST by a video-enabled telemedicine application and verified that I am speaking with the correct person using two identifiers.  Location: Patient: Virtual Visit Location  Patient: Home Provider: Virtual Visit Location Provider: Home Office   I discussed the limitations of evaluation and management by telemedicine and the availability of in person appointments. The patient expressed understanding and agreed to proceed.    History of Present Illness: Jasmine Gray is a 33 y.o. who identifies as a female who was assigned female at birth, and is being seen today for sinus congestion.  HPI: URI  This is a new problem. The current episode started in the past 7 days (Thursday night worsened; had something similar in Oct 2025 and treated as Viral, symptoms improved but never fully resolved then worsened on Thursday). The problem has been gradually worsening. There has been no fever. Associated symptoms include congestion, coughing (productive), headaches (intermittent), a plugged ear sensation, rhinorrhea (and post nasal drainage), sinus pain and a sore throat. Pertinent negatives include no diarrhea, ear pain, nausea, vomiting or wheezing. Treatments tried: tylenol  sinus, flonase . The treatment provided no relief.     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 11/05/2022   Nicotine  dependence with current use 11/05/2022   Anxiety and depression 06/20/2022   History of gestational hypertension 06/20/2022   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) 03/20/2022   DVT (deep venous thrombosis) (HCC)  03/20/2022   History of IUFD 03/20/2022   Essential hypertension 03/20/2022   Postpartum care following cesarean delivery 03/05/2022   History of premature delivery 04/29/2016   Overweight (BMI 25.0-29.9) 07/28/2015   Tobacco user 05/31/2014    Allergies:  Allergies  Allergen Reactions   Penicillins Hives   Medications:  Current Outpatient Medications:    doxycycline (VIBRA-TABS) 100 MG tablet, Take 1 tablet (100 mg total) by mouth 2 (two) times daily., Disp: 14 tablet, Rfl: 0   ipratropium (ATROVENT ) 0.03 % nasal spray, Place 2 sprays into both nostrils every 12 (twelve) hours., Disp: 30 mL, Rfl: 0   ELIQUIS  5 MG TABS tablet, TAKE 1 TABLET (5 MG TOTAL) BY MOUTH 2 (TWO) TIMES DAILY. + HETEROZYGOUS FVL, Disp: 180 tablet, Rfl: 1   fluticasone  (FLONASE ) 50 MCG/ACT nasal spray, Place 2 sprays into both nostrils daily., Disp: 16 g, Rfl: 0   labetalol  (NORMODYNE ) 100 MG tablet, Take 1 tablet (100 mg total) by mouth 2 (two) times daily., Disp: 60 tablet, Rfl: 0   naproxen  (NAPROSYN ) 500 MG tablet, Take 1 tablet (500 mg total) by mouth 2 (two) times daily with a meal., Disp: 30 tablet, Rfl: 0  Observations/Objective: Patient is well-developed, well-nourished in no acute distress.  Resting comfortably at home.  Head is normocephalic, atraumatic.  No labored breathing.  Speech is clear and coherent with logical content.  Patient is alert and oriented at baseline.    Assessment and Plan: 1. Acute bacterial sinusitis (Primary) - doxycycline (VIBRA-TABS) 100 MG tablet; Take 1 tablet (100 mg total) by mouth 2 (two) times daily.  Dispense: 14 tablet; Refill: 0 - ipratropium (ATROVENT ) 0.03 % nasal spray; Place 2 sprays into both nostrils every 12 (twelve) hours.  Dispense: 30 mL; Refill: 0  - Worsening symptoms that have not responded to OTC medications.  - Will give Doxycycline - Continue allergy medications.  - Steam and humidifier can help - Stay well hydrated and get plenty of rest.  -  Seek in person evaluation if no symptom improvement or if symptoms worsen   Follow Up Instructions: I discussed the assessment and treatment plan with the patient. The patient was provided an opportunity to ask questions and all were answered. The patient agreed with the plan and demonstrated an understanding of the instructions.  A copy of instructions were sent to the patient via MyChart unless otherwise noted below.    The patient was advised to call back or seek an in-person evaluation if the symptoms worsen or if the condition fails to improve as anticipated.    Delon CHRISTELLA Dickinson, PA-C

## 2023-12-23 ENCOUNTER — Telehealth: Admitting: Physician Assistant

## 2023-12-23 DIAGNOSIS — J019 Acute sinusitis, unspecified: Secondary | ICD-10-CM | POA: Diagnosis not present

## 2023-12-23 DIAGNOSIS — B9689 Other specified bacterial agents as the cause of diseases classified elsewhere: Secondary | ICD-10-CM | POA: Diagnosis not present

## 2023-12-23 DIAGNOSIS — Z789 Other specified health status: Secondary | ICD-10-CM | POA: Diagnosis not present

## 2023-12-23 MED ORDER — AZITHROMYCIN 250 MG PO TABS: ORAL_TABLET | ORAL | 0 refills | Status: AC

## 2023-12-23 MED ORDER — PREDNISONE 10 MG (21) PO TBPK: ORAL_TABLET | ORAL | 0 refills | Status: DC

## 2023-12-23 NOTE — Patient Instructions (Addendum)
  Tillman CHRISTELLA Rase, thank you for joining Elsie Velma Lunger, PA-C for today's virtual visit.  While this provider is not your primary care provider (PCP), if your PCP is located in our provider database this encounter information will be shared with them immediately following your visit.   A [facility] MyChart account gives you access to today's visit and all your visits, tests, and labs performed at [facility]  click here if you don't have a [facility] MyChart account or go to [email or web address]  Consent: (Patient) Jasmine Gray provided verbal consent for this virtual visit at the beginning of the encounter.  Current Medications:  Current Outpatient Medications:    doxycycline (VIBRA-TABS) 100 MG tablet, Take 1 tablet (100 mg total) by mouth 2 (two) times daily., Disp: 14 tablet, Rfl: 0   ELIQUIS  5 MG TABS tablet, TAKE 1 TABLET (5 MG TOTAL) BY MOUTH 2 (TWO) TIMES DAILY. + HETEROZYGOUS FVL, Disp: 180 tablet, Rfl: 1   fluticasone  (FLONASE ) 50 MCG/ACT nasal spray, Place 2 sprays into both nostrils daily., Disp: 16 g, Rfl: 0   ipratropium (ATROVENT ) 0.03 % nasal spray, Place 2 sprays into both nostrils every 12 (twelve) hours., Disp: 30 mL, Rfl: 0   labetalol  (NORMODYNE ) 100 MG tablet, Take 1 tablet (100 mg total) by mouth 2 (two) times daily., Disp: 60 tablet, Rfl: 0   naproxen  (NAPROSYN ) 500 MG tablet, Take 1 tablet (500 mg total) by mouth 2 (two) times daily with a meal., Disp: 30 tablet, Rfl: 0   Medications ordered in this encounter:  No orders of the defined types were placed in this encounter.    *If you need refills on other medications prior to your next appointment, please contact your pharmacy*  Follow-Up: Call back or seek an in-person evaluation if the symptoms worsen or if the condition fails to improve as anticipated.  [facility] [phone]  Other Instructions Stop the Doxycycline. Start the new antibiotic as directed. Stay hydrated and  get some rest.  Ok to use Tylenol  Cold and Sinus OTC. If you have a humidifier put it in the bedroom and run at night. Since you cannot tolerate nasal spray, I have sent in a prednisone  pack to start if you have any worsening sinus pressure or pain. If you note any non-resolving, new, or worsening symptoms despite treatment, please seek an in-person evaluation ASAP.    If you have been instructed to have an in-person evaluation today at a local Urgent Care facility, please use the link below. It will take you to a list of all of our available [facility], including address, phone number and hours of operation. Please do not delay care.  [facility]  If you or a family member do not have a primary care provider, use the link below to schedule a visit and establish care. When you choose a [facility] primary care physician or advanced practice provider, you gain a long-term partner in health. Find a Primary Care Provider  Learn more about [facility]'s in-office and virtual care options: [facility] - Get Care Now

## 2023-12-23 NOTE — Progress Notes (Signed)
 Virtual Visit Consent   IDONNA HEEREN, you are scheduled for a virtual visit with a [facility] provider today. Just as with appointments in the office, your consent must be obtained to participate. Your consent will be active for this visit and any virtual visit you may have with one of our providers in the next 365 days. If you have a MyChart account, a copy of this consent can be sent to you electronically.  As this is a virtual visit, video technology does not allow for your provider to perform a traditional examination. This may limit your provider's ability to fully assess your condition. If your provider identifies any concerns that need to be evaluated in person or the need to arrange testing (such as labs, EKG, etc.), we will make arrangements to do so. Although advances in technology are sophisticated, we cannot ensure that it will always work on either your end or our end. If the connection with a video visit is poor, the visit may have to be switched to a telephone visit. With either a video or telephone visit, we are not always able to ensure that we have a secure connection.  By engaging in this virtual visit, you consent to the provision of healthcare and authorize for your insurance to be billed (if applicable) for the services provided during this visit. Depending on your insurance coverage, you may receive a charge related to this service.  I need to obtain your verbal consent now. Are you willing to proceed with your visit today? ARTI TRANG has provided verbal consent on 12/23/2023 for a virtual visit (video or telephone). Elsie Velma Lunger, [place]  Date: 12/23/2023 3:50 PM   Virtual Visit via Video Note   I, Elsie Velma Lunger, connected with  TEMPESTT SILBA  ([id number], 26-Dec-1990) on 12/23/23 at  3:45 PM EST by a video-enabled telemedicine application and verified that I am speaking with the correct person using two identifiers.  Location: Patient: Virtual Visit Location  Patient: Home Provider: Virtual Visit Location Provider: Home Office   I discussed the limitations of evaluation and management by telemedicine and the availability of in person appointments. The patient expressed understanding and agreed to proceed.    History of Present Illness: NIDA MANFREDI is a 33 y.o. who identifies as a female who was assigned female at birth, and is being seen today for intolerance to antibiotic. Was seen two days ago and diagnosed with acute sinusitis, placed on course of Doxycycline. Endorses she started it Sunday evening, taking one dose without issue. Rosezella noting weakness and dizziness 30 minutes or so after taking. Today with similar symptoms with morning dose. Notes it makes her feel very foggy-headed. Improved now since her last dose was earlier this AM. Is also unable to do nasal sprays (was prescribed Atrovent  at last visit).  HPI: HPI  Problems:  Patient Active Problem List   Diagnosis Date Noted   Right upper quadrant abdominal pain 08/09/2023   Acute cholecystitis 08/09/2023   Cholecystitis 08/08/2023   Chronic anticoagulation 08/08/2023   Heterozygous factor V Leiden mutation 12/06/2022   Anxiety about health 12/06/2022   Post traumatic stress disorder (PTSD) 12/06/2022   Encounter for smoking cessation counseling 11/25/2022   Panic attack due to post traumatic stress disorder (PTSD) 11/25/2022   Family history of factor V Leiden mutation 11/05/2022   History of deep venous thrombosis (DVT) of distal vein of right lower extremity 11/05/2022   Nicotine  dependence with current use 11/05/2022  Anxiety and depression 06/20/2022   History of gestational hypertension 06/20/2022   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) 03/20/2022   DVT (deep venous thrombosis) (HCC) 03/20/2022   History of IUFD 03/20/2022   Essential hypertension 03/20/2022   Postpartum care following cesarean delivery 03/05/2022   History of premature delivery 04/29/2016    Overweight (BMI 25.0-29.9) 07/28/2015   Tobacco user 05/31/2014    Allergies:  Allergies  Allergen Reactions   Doxycycline Other (See Comments)    Dizziness   Penicillins Hives   Medications:  Current Outpatient Medications:    azithromycin  (ZITHROMAX ) 250 MG tablet, Take 2 tablets on day 1, then 1 tablet daily on days 2 through 5, Disp: 6 tablet, Rfl: 0   predniSONE  (STERAPRED UNI-PAK 21 TAB) 10 MG (21) TBPK tablet, Take following package directions, Disp: 21 tablet, Rfl: 0   ELIQUIS  5 MG TABS tablet, TAKE 1 TABLET (5 MG TOTAL) BY MOUTH 2 (TWO) TIMES DAILY. + HETEROZYGOUS FVL, Disp: 180 tablet, Rfl: 1   ipratropium (ATROVENT ) 0.03 % nasal spray, Place 2 sprays into both nostrils every 12 (twelve) hours., Disp: 30 mL, Rfl: 0   labetalol  (NORMODYNE ) 100 MG tablet, Take 1 tablet (100 mg total) by mouth 2 (two) times daily., Disp: 60 tablet, Rfl: 0  Observations/Objective: Patient is well-developed, well-nourished in no acute distress.  Resting comfortably  at home.  Head is normocephalic, atraumatic.  No labored breathing.  Speech is clear and coherent with logical content.  Patient is alert and oriented at baseline.   Assessment and Plan: 1. Acute bacterial sinusitis (Primary) - azithromycin  (ZITHROMAX ) 250 MG tablet; Take 2 tablets on day 1, then 1 tablet daily on days 2 through 5  Dispense: 6 tablet; Refill: 0 - predniSONE  (STERAPRED UNI-PAK 21 TAB) 10 MG (21) TBPK tablet; Take following package directions  Dispense: 21 tablet; Refill: 0  2. Medication intolerance  Stop Doxycycline. Start Azithromycin  giving other allergies/intolerances. Supportive measures and OTC medications reviewed. Sterapred pack per directions. In person evaluation discussed.   Follow Up Instructions: I discussed the assessment and treatment plan with the patient. The patient was provided an opportunity to ask questions and all were answered. The patient agreed with the plan and demonstrated an  understanding of the instructions.  A copy of instructions were sent to the patient via MyChart unless otherwise noted below.   The patient was advised to call back or seek an in-person evaluation if the symptoms worsen or if the condition fails to improve as anticipated.    Elsie Velma Lunger, PA-C

## 2023-12-31 ENCOUNTER — Ambulatory Visit: Admitting: Family Medicine

## 2024-01-01 ENCOUNTER — Other Ambulatory Visit: Payer: Self-pay | Admitting: Family Medicine

## 2024-01-01 DIAGNOSIS — I10 Essential (primary) hypertension: Secondary | ICD-10-CM

## 2024-01-11 ENCOUNTER — Encounter: Payer: Self-pay | Admitting: Physician Assistant

## 2024-01-11 ENCOUNTER — Telehealth: Admitting: Physician Assistant

## 2024-01-11 DIAGNOSIS — R3989 Other symptoms and signs involving the genitourinary system: Secondary | ICD-10-CM

## 2024-01-11 MED ORDER — NITROFURANTOIN MONOHYD MACRO 100 MG PO CAPS: 100.0000 mg | ORAL_CAPSULE | Freq: Two times a day (BID) | ORAL | 0 refills | Status: AC

## 2024-01-11 NOTE — Progress Notes (Signed)

## 2024-01-25 ENCOUNTER — Telehealth: Admitting: Family Medicine

## 2024-01-25 DIAGNOSIS — K047 Periapical abscess without sinus: Secondary | ICD-10-CM | POA: Diagnosis not present

## 2024-01-25 MED ORDER — CLINDAMYCIN HCL 300 MG PO CAPS: 300.0000 mg | ORAL_CAPSULE | Freq: Three times a day (TID) | ORAL | 0 refills | Status: AC

## 2024-01-25 NOTE — Progress Notes (Signed)
 E-Visit for Dental Pain  We are sorry that you are not feeling well.  Here is how we plan to help!  Based on what you have shared with me in the questionnaire, it sounds like you have dental infection.  Clindamycin  300mg  3 times a day for 7 days  It is imperative that you see a dentist within 10 days of this eVisit to determine the cause of the dental pain and be sure it is adequately treated  A toothache or tooth pain is caused when the nerve in the root of a tooth or surrounding a tooth is irritated. Dental (tooth) infection, decay, injury, or loss of a tooth are the most common causes of dental pain. Pain may also occur after an extraction (tooth is pulled out). Pain sometimes originates from other areas and radiates to the jaw, thus appearing to be tooth pain.Bacteria growing inside your mouth can contribute to gum disease and dental decay, both of which can cause pain. A toothache occurs from inflammation of the central portion of the tooth called pulp. The pulp contains nerve endings that are very sensitive to pain. Inflammation to the pulp or pulpitis may be caused by dental cavities, trauma, and infection.    HOME CARE:   For toothaches: Over-the-counter pain medications such as acetaminophen  or ibuprofen  may be used. Take these as directed on the package while you arrange for a dental appointment. Avoid very cold or hot foods, because they may make the pain worse. You may get relief from biting on a cotton ball soaked in oil of cloves. You can get oil of cloves at most drug stores.  For jaw pain:  Aspirin may be helpful for problems in the joint of the jaw in adults. If pain happens every time you open your mouth widely, the temporomandibular joint (TMJ) may be the source of the pain. Yawning or taking a large bite of food may worsen the pain. An appointment with your doctor or dentist will help you find the cause.     GET HELP RIGHT AWAY IF:  You have a high fever or chills If  you have had a recent head or face injury and develop headache, light headedness, nausea, vomiting, or other symptoms that concern you after an injury to your face or mouth, you could have a more serious injury in addition to your dental injury. A facial rash associated with a toothache: This condition may improve with medication. Contact your doctor for them to decide what is appropriate. Any jaw pain occurring with chest pain: Although jaw pain is most commonly caused by dental disease, it is sometimes referred pain from other areas. People with heart disease, especially people who have had stents placed, people with diabetes, or those who have had heart surgery may have jaw pain as a symptom of heart attack or angina. If your jaw or tooth pain is associated with lightheadedness, sweating, or shortness of breath, you should see a doctor as soon as possible. Trouble swallowing or excessive pain or bleeding from gums: If you have a history of a weakened immune system, diabetes, or steroid use, you may be more susceptible to infections. Infections can often be more severe and extensive or caused by unusual organisms. Dental and gum infections in people with these conditions may require more aggressive treatment. An abscess may need draining or IV antibiotics, for example.  MAKE SURE YOU   Understand these instructions. Will watch your condition. Will get help right away if you are  not doing well or get worse.  Thank you for choosing an e-visit.  Your e-visit answers were reviewed by a board certified advanced clinical practitioner to complete your personal care plan. Depending upon the condition, your plan could have included both over the counter or prescription medications.  Please review your pharmacy choice. Make sure the pharmacy is open so you can pick up prescription now. If there is a problem, you may contact your provider through [employer] and have the prescription routed to another  pharmacy.  Your safety is important to us . If you have drug allergies check your prescription carefully.   For the next 24 hours you can use MyChart to ask questions about today's visit, request a non-urgent call back, or ask for a work or school excuse. You will get an email in the next two days asking about your experience. I hope that your e-visit has been valuable and will speed your recovery.  I have spent 5 minutes in review of e-visit questionnaire, review and updating patient chart, medical decision making and response to patient.   Kasee Hantz, FNP

## 2024-02-06 ENCOUNTER — Telehealth: Admitting: Physician Assistant

## 2024-02-06 DIAGNOSIS — L0231 Cutaneous abscess of buttock: Secondary | ICD-10-CM

## 2024-02-06 MED ORDER — SULFAMETHOXAZOLE-TRIMETHOPRIM 800-160 MG PO TABS: 1.0000 | ORAL_TABLET | Freq: Two times a day (BID) | ORAL | 0 refills | Status: DC

## 2024-02-06 NOTE — Progress Notes (Signed)
 E Visit for Cellulitis  We are sorry that you are not feeling well. Here is how we plan to help!  Based on what you shared, it appears you may have cellulitis.   Cellulitis is a bacterial skin infection that typically presents with redness, swelling, warmth, and tenderness in the affected area. Small red spots, minor bleeding under the skin, and fluid-filled blisters may also appear. Fever can sometimes accompany the infection.    Cellulitis usually affects only one side of the body, with the lower limbs being the most commonly involved area  I have prescribed:  Bactrim  DS -- Take 1 tablet by mouth twice a day for 7 days  HOME CARE:  Take all medications as prescribed and be sure to complete the full course - even if your skin appears to be improving.   GET HELP RIGHT AWAY IF:  Symptoms do not begin to improve within 48 hours. Severe redness persists or worsens. If the area turns color, spreads or swells. If it blisters and opens, develops yellow-brown crust or bleeds. You develop a fever or chills. If the pain increases or becomes unbearable.  Are unable to keep fluids and food down.  MAKE SURE YOU   Understand these instructions. Will watch your condition. Will get help right away if you are not doing well or get worse.  Thank you for choosing an e-visit.   Your e-visit answers were reviewed by a board certified advanced clinical practitioner to complete your personal care plan. Depending upon the condition, your plan could have included both over the counter or prescription medications.   Please review your pharmacy choice. Make sure the pharmacy is open so you can pick up the prescription now. If there is a problem, you may contact your provider through [employer] and have the prescription routed to another pharmacy.   Your safety is important to us . If you have drug allergies, check your prescription carefully.   For the next 24 hours you can use MyChart to ask  questions about today's visit, request a non-urgent call back, or ask for a work or school excuse.   You will receive an email in the next two days asking about your experience. I hope that your e-visit has been valuable and will speed up your recovery.    I have spent 5 minutes in review of e-visit questionnaire, review and updating patient chart, medical decision making and response to patient.   Delon CHRISTELLA Dickinson, PA-C

## 2024-02-09 NOTE — Progress Notes (Deleted)
 "    Gynecology Annual Exam   PCP: Donzella Lauraine SAILOR, DO  Chief Complaint: No chief complaint on file.   History of Present Illness: Patient is a 34 y.o. Jasmine Gray presents for annual exam. The patient has no complaints today.   LMP: No LMP recorded. Average Interval: {Desc; regular/irreg:14544}, {numbers 22-35:14824} days Duration of flow: {numbers; 0-10:33138} days Heavy Menses: {yes/no:63} Clots: {yes/no:63} Intermenstrual Bleeding: {yes/no:63} Postcoital Bleeding: {yes/no:63} Dysmenorrhea: {yes/no:63}  The patient {sys sexually active:13135} sexually active. She currently uses {method:5051} for contraception. She {has/denies:315300} dyspareunia.  The patient {DOES_DOES WNU:81435} perform self breast exams.  There {is/is no:19420} notable family history of breast or ovarian cancer in her family.  The patient wears seatbelts: {yes/no:63}.   The patient has regular exercise: {yes/no/not asked:9010}.    The patient {Blank single:19197::reports,denies} current symptoms of depression.    Review of Systems: ROS  Past Medical History:  Patient Active Problem List   Diagnosis Date Noted Date Diagnosed   Right upper quadrant abdominal pain 08/09/2023    Acute cholecystitis 08/09/2023    Cholecystitis 08/08/2023    Chronic anticoagulation 08/08/2023    Heterozygous factor V Leiden mutation 12/06/2022     c.1601G>A (p.Arg534Gln) - Detected, Heterozygous  This result is associated with a 6- to 8-fold increased risk for venous thromboembolism.     Anxiety about health 12/06/2022     Related to history of IUFD 71 w 1/24, s/p c section earlier this year with postop DVT/PE; now positive for heterozygous single gene factor V Leiden mutation.     Post traumatic stress disorder (PTSD) 12/06/2022     Related to history of IUFD 5 w 1/24, s/p c section earlier this year with postop DVT/PE; now positive for heterozygous single gene factor V Leiden mutation.     Encounter for smoking  cessation counseling 11/25/2022    Panic attack due to post traumatic stress disorder (PTSD) 11/25/2022    Family history of factor V Leiden mutation 11/05/2022    History of deep venous thrombosis (DVT) of distal vein of right lower extremity 11/05/2022    Nicotine  dependence with current use 11/05/2022    Anxiety and depression 06/20/2022    History of gestational hypertension 06/20/2022    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) 03/20/2022    DVT (deep venous thrombosis) (HCC) 03/20/2022    History of IUFD 03/20/2022    Essential hypertension 03/20/2022    Postpartum care following cesarean delivery 03/05/2022    History of premature delivery 04/29/2016     At 35 weeks    Overweight (BMI 25.0-29.9) 07/28/2015    Tobacco user 05/31/2014     Past Surgical History:  Past Surgical History:  Procedure Laterality Date   CESAREAN SECTION  03/03/2022   Procedure: CESAREAN SECTION;  Surgeon: Janit Alm Agent, MD;  Location: [facility] ORS;  Service: Obstetrics;;   INDOCYANINE GREEN  FLUORESCENCE IMAGING (ICG) N/A 08/09/2023   Procedure: INDOCYANINE GREEN  FLUORESCENCE IMAGING (ICG);  Surgeon: Jordis Laneta FALCON, MD;  Location: [facility] ORS;  Service: General;  Laterality: N/A;    Gynecologic History:  No LMP recorded. Contraception: {method:5051} Last Pap: Results were: {Findings; lab pap smear results:16707::NIL and HR HPV+,NIL and HR HPV negative}   Obstetric History: Jasmine Gray  Family History:  Family History  Problem Relation Age of Onset   Hypertension Mother    Migraines Mother    Heart disease Father    Hypertension Father    Congestive Heart Failure Father    Stroke Father  Factor V Leiden deficiency Father    Cancer Maternal Grandmother 37       breast   Alcohol abuse Maternal Grandfather    Hypertension Paternal Grandfather    Depression Neg Hx     Social History:  Social History   Socioeconomic History   Marital status: Significant Other    Spouse name: Not  on file   Number of children: 2   Years of education: 12   Highest education level: 10th grade  Occupational History   Occupation: stay at home mom  Tobacco Use   Smoking status: Every Day    Current packs/day: 0.50    Average packs/day: 0.5 packs/day for 10.0 years (5.0 ttl pk-yrs)    Types: Cigarettes    Passive exposure: Current   Smokeless tobacco: Never  Vaping Use   Vaping status: Never Used  Substance and Sexual Activity   Alcohol use: Never   Drug use: Never   Sexual activity: Yes    Partners: Male    Birth control/protection: Condom  Other Topics Concern   Not on file  Social History Narrative   Not on file   Social Drivers of Health   Tobacco Use: High Risk (01/11/2024)   Patient History    Smoking Tobacco Use: Every Day    Smokeless Tobacco Use: Never    Passive Exposure: Current  Financial Resource Strain: Medium Risk (12/16/2023)   Overall Financial Resource Strain (CARDIA)    Difficulty of Paying Living Expenses: Somewhat hard  Food Insecurity: No Food Insecurity (12/16/2023)   Epic    Worried About Programme Researcher, Broadcasting/film/video in the Last Year: Never true    Ran Out of Food in the Last Year: Never true  Transportation Needs: No Transportation Needs (12/16/2023)   Epic    Lack of Transportation (Medical): No    Lack of Transportation (Non-Medical): No  Physical Activity: Sufficiently Active (12/16/2023)   Exercise Vital Sign    Days of Exercise per Week: 5 days    Minutes of Exercise per Session: 30 min  Recent Concern: Physical Activity - Insufficiently Active (09/29/2023)   Exercise Vital Sign    Days of Exercise per Week: 7 days    Minutes of Exercise per Session: 20 min  Stress: Stress Concern Present (12/16/2023)   [employer] of Occupational Health - Occupational Stress Questionnaire    Feeling of Stress: Rather much  Social Connections: Unknown (12/16/2023)   Social Connection and Isolation Panel    Frequency of Communication with Friends and  Family: Three times a week    Frequency of Social Gatherings with Friends and Family: Once a week    Attends Religious Services: More than 4 times per year    Active Member of Clubs or Organizations: Yes    Attends Banker Meetings: More than 4 times per year    Marital Status: Patient declined  Intimate Partner Violence: Not At Risk (08/08/2023)   Epic    Fear of Current or Ex-Partner: No    Emotionally Abused: No    Physically Abused: No    Sexually Abused: No  Depression (PHQ2-9): Medium Risk (12/06/2022)   Depression (PHQ2-9)    PHQ-2 Score: 7  Alcohol Screen: Low Risk (11/04/2022)   Alcohol Screen    Last Alcohol Screening Score (AUDIT): 0  Housing: High Risk (12/16/2023)   Epic    Unable to Pay for Housing in the Last Year: Yes    Number of Times Moved in the Last  Year: Not on file    Homeless in the Last Year: No  Utilities: Not At Risk (08/08/2023)   Epic    Threatened with loss of utilities: No  Health Literacy: Not on file    Allergies:  Allergies[1]  Medications: Prior to Admission medications  Medication Sig Start Date End Date Taking? Authorizing Provider  ELIQUIS  5 MG TABS tablet TAKE 1 TABLET (5 MG TOTAL) BY MOUTH 2 (TWO) TIMES DAILY. + HETEROZYGOUS FVL 09/03/23   Pardue, Lauraine SAILOR, DO  labetalol  (NORMODYNE ) 100 MG tablet TAKE 1 TABLET BY MOUTH TWICE A DAY 01/02/24   Pardue, Lauraine SAILOR, DO  sulfamethoxazole -trimethoprim  (BACTRIM  DS) 800-160 MG tablet Take 1 tablet by mouth 2 (two) times daily. 02/06/24   Vivienne Delon HERO, PA-C  albuterol  (VENTOLIN  HFA) 108 (90 Base) MCG/ACT inhaler Inhale 2 puffs into the lungs every 6 (six) hours as needed for wheezing or shortness of breath. 02/14/20 04/22/20  Lavell Bari LABOR, FNP  metoprolol  tartrate (LOPRESSOR ) 25 MG tablet Take 0.5 tablets (12.5 mg total) by mouth 2 (two) times daily as needed (Palpitations). Patient not taking: Reported on 07/08/2019 03/29/19 04/22/20  Angelena Smalls, MD  omeprazole  (PRILOSEC) 20 MG  capsule Take 1 capsule (20 mg total) by mouth daily. 07/17/19 04/22/20  McVey, Almarie Folks, PA-C    Physical Exam Vitals: There were no vitals taken for this visit.  General: NAD HEENT: normocephalic, anicteric Thyroid: no enlargement, no palpable nodules Pulmonary: No increased work of breathing, CTAB Cardiovascular: RRR, distal pulses 2+ Breast: Breast symmetrical, no tenderness, no palpable nodules or masses, no skin or nipple retraction present, no nipple discharge.  No axillary or supraclavicular lymphadenopathy. Abdomen: NABS, soft, non-tender, non-distended.  Umbilicus without lesions.  No hepatomegaly, splenomegaly or masses palpable. No evidence of hernia  Genitourinary:  External: Normal external female genitalia.  Normal urethral meatus, normal Bartholin's and Skene's glands.    Vagina: Normal vaginal mucosa, no evidence of prolapse.    Cervix: Grossly normal in appearance, no bleeding  Uterus: Non-enlarged, mobile, normal contour.  No CMT  Adnexa: ovaries non-enlarged, no adnexal masses  Rectal: deferred  Lymphatic: no evidence of inguinal lymphadenopathy Extremities: no edema, erythema, or tenderness Neurologic: Grossly intact Psychiatric: mood appropriate, affect full  Female chaperone present for pelvic and breast  portions of the physical exam    Assessment: 35 y.o. Jasmine Gray routine annual exam  Plan: Problem List Items Addressed This Visit   None   2) STI screening  {Blank single:19197::was,was not}offered and {Blank single:19197::accepted,declined,therefore not obtained}  2)  ASCCP guidelines and rational discussed.  Patient opts for {Blank single:19197::***,every 5 years,every 3 years,yearly,discontinue age >65,discontinue secondary to prior hysterectomy} screening interval  3) Contraception - the patient is currently using  {method:5051}.  She is {Blank single:19197::happy with her current form of contraception and plans to  continue,interested in changing to ***,interested in starting Contraception: ***,not currently in need of contraception secondary to being sterile,attempting to conceive in the near future}  4) Routine healthcare maintenance including cholesterol, diabetes screening discussed {Blank single:19197::managed by PCP,Ordered today,To return fasting at a later date,Declines}  5) No follow-ups on file.  Jinnie Cookey, CNM  [facility] 02/09/2024, 4:14 PM        [1]  Allergies Allergen Reactions   Doxycycline  Other (See Comments)    Dizziness   Penicillins Hives   "

## 2024-02-10 ENCOUNTER — Ambulatory Visit: Admitting: Licensed Practical Nurse

## 2024-02-13 ENCOUNTER — Ambulatory Visit: Admitting: Family Medicine

## 2024-03-03 ENCOUNTER — Ambulatory Visit: Admitting: Family Medicine

## 2024-03-09 ENCOUNTER — Encounter: Payer: Self-pay | Admitting: Nurse Practitioner

## 2024-03-09 ENCOUNTER — Telehealth: Admitting: Physician Assistant

## 2024-03-09 ENCOUNTER — Telehealth: Admitting: Nurse Practitioner

## 2024-03-09 ENCOUNTER — Other Ambulatory Visit: Payer: Self-pay | Admitting: Family Medicine

## 2024-03-09 DIAGNOSIS — Z86718 Personal history of other venous thrombosis and embolism: Secondary | ICD-10-CM

## 2024-03-09 DIAGNOSIS — J069 Acute upper respiratory infection, unspecified: Secondary | ICD-10-CM

## 2024-03-09 DIAGNOSIS — U071 COVID-19: Secondary | ICD-10-CM

## 2024-03-09 DIAGNOSIS — Z86711 Personal history of pulmonary embolism: Secondary | ICD-10-CM

## 2024-03-09 MED ORDER — FLUTICASONE PROPIONATE 50 MCG/ACT NA SUSP: 2.0000 | Freq: Every day | NASAL | 0 refills | Status: AC

## 2024-03-09 MED ORDER — MOLNUPIRAVIR EUA 200MG CAPSULE: 4.0000 | ORAL_CAPSULE | Freq: Two times a day (BID) | ORAL | 0 refills | Status: AC

## 2024-03-09 MED ORDER — NAPROXEN 500 MG PO TABS: 500.0000 mg | ORAL_TABLET | Freq: Two times a day (BID) | ORAL | 0 refills | Status: AC

## 2024-03-09 MED ORDER — BENZONATATE 100 MG PO CAPS: 100.0000 mg | ORAL_CAPSULE | Freq: Three times a day (TID) | ORAL | 0 refills | Status: AC | PRN

## 2024-03-24 ENCOUNTER — Ambulatory Visit: Admitting: Family Medicine
